# Patient Record
Sex: Female | Born: 1945
Health system: Southern US, Community
[De-identification: ages and names within clinical notes are randomized; demographics above are authoritative.]

## PROBLEM LIST (undated history)

## (undated) DIAGNOSIS — K589 Irritable bowel syndrome without diarrhea: Secondary | ICD-10-CM

## (undated) DIAGNOSIS — E785 Hyperlipidemia, unspecified: Secondary | ICD-10-CM

## (undated) DIAGNOSIS — E559 Vitamin D deficiency, unspecified: Secondary | ICD-10-CM

## (undated) DIAGNOSIS — Z8616 Personal history of COVID-19: Secondary | ICD-10-CM

## (undated) DIAGNOSIS — R197 Diarrhea, unspecified: Secondary | ICD-10-CM

## (undated) DIAGNOSIS — D509 Iron deficiency anemia, unspecified: Secondary | ICD-10-CM

## (undated) DIAGNOSIS — K5790 Diverticulosis of intestine, part unspecified, without perforation or abscess without bleeding: Secondary | ICD-10-CM

## (undated) DIAGNOSIS — K635 Polyp of colon: Secondary | ICD-10-CM

## (undated) DIAGNOSIS — Z860100 Personal history of colon polyps, unspecified: Secondary | ICD-10-CM

## (undated) DIAGNOSIS — Z5189 Encounter for other specified aftercare: Secondary | ICD-10-CM

## (undated) DIAGNOSIS — E039 Hypothyroidism, unspecified: Secondary | ICD-10-CM

## (undated) DIAGNOSIS — K219 Gastro-esophageal reflux disease without esophagitis: Secondary | ICD-10-CM

## (undated) DIAGNOSIS — I10 Essential (primary) hypertension: Secondary | ICD-10-CM

## (undated) DIAGNOSIS — N3281 Overactive bladder: Secondary | ICD-10-CM

## (undated) DIAGNOSIS — F32A Depression, unspecified: Secondary | ICD-10-CM

## (undated) DIAGNOSIS — K648 Other hemorrhoids: Secondary | ICD-10-CM

## (undated) DIAGNOSIS — Z8719 Personal history of other diseases of the digestive system: Secondary | ICD-10-CM

## (undated) DIAGNOSIS — Z85828 Personal history of other malignant neoplasm of skin: Secondary | ICD-10-CM

## (undated) DIAGNOSIS — I447 Left bundle-branch block, unspecified: Secondary | ICD-10-CM

## (undated) DIAGNOSIS — I509 Heart failure, unspecified: Secondary | ICD-10-CM

## (undated) DIAGNOSIS — H269 Unspecified cataract: Secondary | ICD-10-CM

## (undated) DIAGNOSIS — F329 Major depressive disorder, single episode, unspecified: Secondary | ICD-10-CM

## (undated) DIAGNOSIS — F419 Anxiety disorder, unspecified: Secondary | ICD-10-CM

## (undated) DIAGNOSIS — T7840XA Allergy, unspecified, initial encounter: Secondary | ICD-10-CM

## (undated) DIAGNOSIS — G250 Essential tremor: Secondary | ICD-10-CM

## (undated) DIAGNOSIS — N189 Chronic kidney disease, unspecified: Secondary | ICD-10-CM

## (undated) DIAGNOSIS — M1990 Unspecified osteoarthritis, unspecified site: Secondary | ICD-10-CM

## (undated) DIAGNOSIS — R490 Dysphonia: Secondary | ICD-10-CM

## (undated) DIAGNOSIS — N6019 Diffuse cystic mastopathy of unspecified breast: Secondary | ICD-10-CM

## (undated) DIAGNOSIS — C801 Malignant (primary) neoplasm, unspecified: Secondary | ICD-10-CM

## (undated) DIAGNOSIS — I499 Cardiac arrhythmia, unspecified: Secondary | ICD-10-CM

## (undated) HISTORY — DX: Major depressive disorder, single episode, unspecified: F32.9

## (undated) HISTORY — PX: COLONOSCOPY: SHX174

## (undated) HISTORY — PX: THROAT SURGERY: SHX803

## (undated) HISTORY — DX: Overactive bladder: N32.81

## (undated) HISTORY — DX: Malignant (primary) neoplasm, unspecified: C80.1

## (undated) HISTORY — DX: Essential (primary) hypertension: I10

## (undated) HISTORY — DX: Unspecified osteoarthritis, unspecified site: M19.90

## (undated) HISTORY — DX: Encounter for other specified aftercare: Z51.89

## (undated) HISTORY — DX: Allergy, unspecified, initial encounter: T78.40XA

## (undated) HISTORY — PX: HERNIA REPAIR: SHX51

## (undated) HISTORY — DX: Hypothyroidism, unspecified: E03.9

## (undated) HISTORY — PX: UPPER GASTROINTESTINAL ENDOSCOPY: SHX188

## (undated) HISTORY — DX: Hyperlipidemia, unspecified: E78.5

## (undated) HISTORY — DX: Personal history of COVID-19: Z86.16

## (undated) HISTORY — DX: Diffuse cystic mastopathy of unspecified breast: N60.19

## (undated) HISTORY — DX: Personal history of other malignant neoplasm of skin: Z85.828

## (undated) HISTORY — DX: Anxiety disorder, unspecified: F41.9

## (undated) HISTORY — DX: Vitamin D deficiency, unspecified: E55.9

## (undated) HISTORY — DX: Unspecified cataract: H26.9

## (undated) HISTORY — PX: NISSEN FUNDOPLICATION: SHX2091

## (undated) HISTORY — DX: Chronic kidney disease, unspecified: N18.9

## (undated) HISTORY — DX: Polyp of colon: K63.5

## (undated) HISTORY — DX: Dysphonia: R49.0

## (undated) HISTORY — DX: Irritable bowel syndrome, unspecified: K58.9

## (undated) HISTORY — DX: Heart failure, unspecified: I50.9

## (undated) HISTORY — PX: ABDOMINAL HYSTERECTOMY: SHX81

## (undated) HISTORY — DX: Iron deficiency anemia, unspecified: D50.9

## (undated) HISTORY — DX: Depression, unspecified: F32.A

## (undated) HISTORY — PX: NASAL SINUS SURGERY: SHX719

## (undated) HISTORY — DX: Essential tremor: G25.0

## (undated) HISTORY — DX: Personal history of colon polyps, unspecified: Z86.0100

## (undated) HISTORY — DX: Diarrhea, unspecified: R19.7

## (undated) HISTORY — DX: Diverticulosis of intestine, part unspecified, without perforation or abscess without bleeding: K57.90

## (undated) HISTORY — PX: THYROIDECTOMY: SHX17

## (undated) HISTORY — PX: FOOT SURGERY: SHX648

## (undated) HISTORY — DX: Personal history of colonic polyps: Z86.010

## (undated) HISTORY — PX: OTHER SURGICAL HISTORY: SHX169

## (undated) HISTORY — DX: Gastro-esophageal reflux disease without esophagitis: K21.9

## (undated) HISTORY — DX: Other hemorrhoids: K64.8

---

## 1982-04-03 HISTORY — PX: ABDOMINAL HYSTERECTOMY: SHX81

## 2001-07-31 ENCOUNTER — Encounter: Payer: Self-pay | Admitting: Internal Medicine

## 2001-08-22 ENCOUNTER — Encounter: Payer: Self-pay | Admitting: Internal Medicine

## 2001-08-23 ENCOUNTER — Encounter: Payer: Self-pay | Admitting: Internal Medicine

## 2001-09-04 ENCOUNTER — Encounter: Payer: Self-pay | Admitting: Internal Medicine

## 2001-09-08 ENCOUNTER — Encounter: Payer: Self-pay | Admitting: Internal Medicine

## 2001-09-10 ENCOUNTER — Ambulatory Visit (HOSPITAL_COMMUNITY): Admission: RE | Admit: 2001-09-10 | Discharge: 2001-09-10 | Payer: Self-pay | Admitting: Neurosurgery

## 2001-09-12 ENCOUNTER — Ambulatory Visit (HOSPITAL_COMMUNITY): Admission: RE | Admit: 2001-09-12 | Discharge: 2001-09-12 | Payer: Self-pay | Admitting: Neurosurgery

## 2001-10-09 ENCOUNTER — Encounter: Payer: Self-pay | Admitting: Internal Medicine

## 2001-11-06 ENCOUNTER — Encounter: Payer: Self-pay | Admitting: Internal Medicine

## 2001-12-03 ENCOUNTER — Encounter: Payer: Self-pay | Admitting: Internal Medicine

## 2001-12-10 ENCOUNTER — Encounter (HOSPITAL_COMMUNITY): Admission: RE | Admit: 2001-12-10 | Discharge: 2002-03-10 | Payer: Self-pay | Admitting: Endocrinology

## 2001-12-11 ENCOUNTER — Encounter: Payer: Self-pay | Admitting: Endocrinology

## 2001-12-31 ENCOUNTER — Encounter: Payer: Self-pay | Admitting: Internal Medicine

## 2002-02-26 ENCOUNTER — Encounter: Payer: Self-pay | Admitting: Internal Medicine

## 2002-04-03 HISTORY — PX: THYROIDECTOMY: SHX17

## 2002-04-07 ENCOUNTER — Encounter: Payer: Self-pay | Admitting: Surgery

## 2002-04-08 ENCOUNTER — Inpatient Hospital Stay (HOSPITAL_COMMUNITY): Admission: RE | Admit: 2002-04-08 | Discharge: 2002-04-10 | Payer: Self-pay | Admitting: Surgery

## 2002-04-08 ENCOUNTER — Encounter (INDEPENDENT_AMBULATORY_CARE_PROVIDER_SITE_OTHER): Payer: Self-pay | Admitting: *Deleted

## 2003-08-10 ENCOUNTER — Encounter: Payer: Self-pay | Admitting: Internal Medicine

## 2003-08-27 ENCOUNTER — Encounter: Payer: Self-pay | Admitting: Internal Medicine

## 2004-04-03 DIAGNOSIS — K259 Gastric ulcer, unspecified as acute or chronic, without hemorrhage or perforation: Secondary | ICD-10-CM

## 2004-04-03 HISTORY — DX: Gastric ulcer, unspecified as acute or chronic, without hemorrhage or perforation: K25.9

## 2004-04-06 ENCOUNTER — Ambulatory Visit: Payer: Self-pay

## 2004-10-28 ENCOUNTER — Inpatient Hospital Stay (HOSPITAL_COMMUNITY): Admission: AD | Admit: 2004-10-28 | Discharge: 2004-10-31 | Payer: Self-pay | Admitting: Internal Medicine

## 2004-10-29 ENCOUNTER — Encounter (INDEPENDENT_AMBULATORY_CARE_PROVIDER_SITE_OTHER): Payer: Self-pay | Admitting: *Deleted

## 2004-10-31 ENCOUNTER — Encounter (INDEPENDENT_AMBULATORY_CARE_PROVIDER_SITE_OTHER): Payer: Self-pay | Admitting: *Deleted

## 2004-11-02 ENCOUNTER — Ambulatory Visit: Payer: Self-pay | Admitting: Internal Medicine

## 2004-11-29 ENCOUNTER — Ambulatory Visit: Payer: Self-pay | Admitting: Internal Medicine

## 2004-11-29 ENCOUNTER — Ambulatory Visit (HOSPITAL_COMMUNITY): Admission: RE | Admit: 2004-11-29 | Discharge: 2004-11-29 | Payer: Self-pay | Admitting: Internal Medicine

## 2004-12-13 ENCOUNTER — Ambulatory Visit: Payer: Self-pay | Admitting: Internal Medicine

## 2004-12-15 ENCOUNTER — Encounter: Payer: Self-pay | Admitting: Internal Medicine

## 2005-04-02 ENCOUNTER — Encounter (INDEPENDENT_AMBULATORY_CARE_PROVIDER_SITE_OTHER): Payer: Self-pay | Admitting: *Deleted

## 2005-07-03 ENCOUNTER — Ambulatory Visit: Payer: Self-pay

## 2006-07-10 ENCOUNTER — Ambulatory Visit: Payer: Self-pay | Admitting: Internal Medicine

## 2007-08-20 ENCOUNTER — Ambulatory Visit: Payer: Self-pay | Admitting: Internal Medicine

## 2008-08-24 ENCOUNTER — Ambulatory Visit: Payer: Self-pay | Admitting: Internal Medicine

## 2008-11-12 ENCOUNTER — Telehealth: Payer: Self-pay | Admitting: Internal Medicine

## 2008-11-19 DIAGNOSIS — K648 Other hemorrhoids: Secondary | ICD-10-CM | POA: Insufficient documentation

## 2008-11-19 DIAGNOSIS — K589 Irritable bowel syndrome without diarrhea: Secondary | ICD-10-CM | POA: Insufficient documentation

## 2008-11-19 DIAGNOSIS — K5730 Diverticulosis of large intestine without perforation or abscess without bleeding: Secondary | ICD-10-CM | POA: Insufficient documentation

## 2008-11-19 DIAGNOSIS — C4490 Unspecified malignant neoplasm of skin, unspecified: Secondary | ICD-10-CM | POA: Insufficient documentation

## 2008-11-19 DIAGNOSIS — K219 Gastro-esophageal reflux disease without esophagitis: Secondary | ICD-10-CM | POA: Insufficient documentation

## 2008-11-19 DIAGNOSIS — Z8719 Personal history of other diseases of the digestive system: Secondary | ICD-10-CM | POA: Insufficient documentation

## 2008-11-19 DIAGNOSIS — N6019 Diffuse cystic mastopathy of unspecified breast: Secondary | ICD-10-CM | POA: Insufficient documentation

## 2008-11-19 DIAGNOSIS — R197 Diarrhea, unspecified: Secondary | ICD-10-CM | POA: Insufficient documentation

## 2008-11-19 DIAGNOSIS — I10 Essential (primary) hypertension: Secondary | ICD-10-CM | POA: Insufficient documentation

## 2008-11-19 DIAGNOSIS — F411 Generalized anxiety disorder: Secondary | ICD-10-CM | POA: Insufficient documentation

## 2008-11-19 DIAGNOSIS — D509 Iron deficiency anemia, unspecified: Secondary | ICD-10-CM | POA: Insufficient documentation

## 2008-11-19 DIAGNOSIS — E039 Hypothyroidism, unspecified: Secondary | ICD-10-CM | POA: Insufficient documentation

## 2008-11-26 ENCOUNTER — Ambulatory Visit: Payer: Self-pay | Admitting: Internal Medicine

## 2008-11-26 DIAGNOSIS — D126 Benign neoplasm of colon, unspecified: Secondary | ICD-10-CM | POA: Insufficient documentation

## 2008-12-02 ENCOUNTER — Telehealth: Payer: Self-pay | Admitting: Internal Medicine

## 2008-12-03 ENCOUNTER — Encounter: Payer: Self-pay | Admitting: Internal Medicine

## 2008-12-03 ENCOUNTER — Ambulatory Visit: Payer: Self-pay | Admitting: Internal Medicine

## 2008-12-08 ENCOUNTER — Encounter: Payer: Self-pay | Admitting: Internal Medicine

## 2009-04-03 HISTORY — PX: HERNIA REPAIR: SHX51

## 2009-06-22 ENCOUNTER — Encounter: Payer: Self-pay | Admitting: Internal Medicine

## 2009-06-23 ENCOUNTER — Encounter: Payer: Self-pay | Admitting: Internal Medicine

## 2009-06-29 ENCOUNTER — Encounter: Payer: Self-pay | Admitting: Internal Medicine

## 2009-06-30 ENCOUNTER — Encounter: Payer: Self-pay | Admitting: Internal Medicine

## 2009-07-01 ENCOUNTER — Ambulatory Visit: Payer: Self-pay | Admitting: Internal Medicine

## 2009-07-05 ENCOUNTER — Telehealth: Payer: Self-pay | Admitting: Internal Medicine

## 2009-07-06 ENCOUNTER — Encounter (INDEPENDENT_AMBULATORY_CARE_PROVIDER_SITE_OTHER): Payer: Self-pay | Admitting: *Deleted

## 2009-07-07 ENCOUNTER — Ambulatory Visit: Payer: Self-pay | Admitting: Internal Medicine

## 2009-07-08 ENCOUNTER — Ambulatory Visit: Payer: Self-pay | Admitting: Internal Medicine

## 2009-07-08 ENCOUNTER — Telehealth: Payer: Self-pay | Admitting: Internal Medicine

## 2009-07-08 DIAGNOSIS — K449 Diaphragmatic hernia without obstruction or gangrene: Secondary | ICD-10-CM | POA: Insufficient documentation

## 2009-07-12 ENCOUNTER — Encounter: Payer: Self-pay | Admitting: Internal Medicine

## 2009-07-13 ENCOUNTER — Ambulatory Visit (HOSPITAL_COMMUNITY)
Admission: RE | Admit: 2009-07-13 | Discharge: 2009-07-13 | Payer: Self-pay | Source: Home / Self Care | Admitting: Internal Medicine

## 2009-08-03 ENCOUNTER — Ambulatory Visit: Payer: Self-pay | Admitting: Internal Medicine

## 2009-08-04 ENCOUNTER — Telehealth: Payer: Self-pay | Admitting: Internal Medicine

## 2009-08-04 LAB — CONVERTED CEMR LAB
Basophils Absolute: 0 10*3/uL (ref 0.0–0.1)
Eosinophils Absolute: 0.1 10*3/uL (ref 0.0–0.7)
Eosinophils Relative: 2 % (ref 0.0–5.0)
HCT: 29.7 % — ABNORMAL LOW (ref 36.0–46.0)
Lymphocytes Relative: 24.6 % (ref 12.0–46.0)
Lymphs Abs: 1.6 10*3/uL (ref 0.7–4.0)
MCHC: 32.6 g/dL (ref 30.0–36.0)
MCV: 71.3 fL — ABNORMAL LOW (ref 78.0–100.0)
Monocytes Relative: 6.7 % (ref 3.0–12.0)
Neutro Abs: 4.4 10*3/uL (ref 1.4–7.7)
Neutrophils Relative %: 66.4 % (ref 43.0–77.0)
Platelets: 274 10*3/uL (ref 150.0–400.0)

## 2009-08-06 ENCOUNTER — Ambulatory Visit (HOSPITAL_COMMUNITY)
Admission: RE | Admit: 2009-08-06 | Discharge: 2009-08-06 | Payer: Self-pay | Source: Home / Self Care | Admitting: Internal Medicine

## 2009-08-20 ENCOUNTER — Encounter: Payer: Self-pay | Admitting: Internal Medicine

## 2009-08-23 ENCOUNTER — Encounter: Payer: Self-pay | Admitting: Internal Medicine

## 2009-09-16 ENCOUNTER — Ambulatory Visit: Payer: Self-pay | Admitting: Internal Medicine

## 2009-10-01 HISTORY — PX: OTHER SURGICAL HISTORY: SHX169

## 2009-10-13 ENCOUNTER — Inpatient Hospital Stay (HOSPITAL_COMMUNITY): Admission: RE | Admit: 2009-10-13 | Discharge: 2009-10-18 | Payer: Self-pay | Admitting: Surgery

## 2009-10-14 ENCOUNTER — Encounter (INDEPENDENT_AMBULATORY_CARE_PROVIDER_SITE_OTHER): Payer: Self-pay | Admitting: *Deleted

## 2009-10-19 ENCOUNTER — Encounter (INDEPENDENT_AMBULATORY_CARE_PROVIDER_SITE_OTHER): Payer: Self-pay | Admitting: *Deleted

## 2009-11-04 ENCOUNTER — Encounter: Payer: Self-pay | Admitting: Internal Medicine

## 2009-11-29 ENCOUNTER — Telehealth: Payer: Self-pay | Admitting: Internal Medicine

## 2009-12-01 ENCOUNTER — Encounter: Admission: RE | Admit: 2009-12-01 | Discharge: 2009-12-01 | Payer: Self-pay | Admitting: Internal Medicine

## 2009-12-02 ENCOUNTER — Ambulatory Visit: Payer: Self-pay | Admitting: Gastroenterology

## 2009-12-02 ENCOUNTER — Ambulatory Visit: Payer: Self-pay | Admitting: Internal Medicine

## 2009-12-02 ENCOUNTER — Telehealth: Payer: Self-pay | Admitting: Internal Medicine

## 2009-12-02 DIAGNOSIS — R143 Flatulence: Secondary | ICD-10-CM

## 2009-12-02 DIAGNOSIS — Z860100 Personal history of colon polyps, unspecified: Secondary | ICD-10-CM | POA: Insufficient documentation

## 2009-12-02 DIAGNOSIS — R142 Eructation: Secondary | ICD-10-CM

## 2009-12-02 DIAGNOSIS — R141 Gas pain: Secondary | ICD-10-CM | POA: Insufficient documentation

## 2009-12-02 DIAGNOSIS — R1013 Epigastric pain: Secondary | ICD-10-CM

## 2009-12-02 DIAGNOSIS — K625 Hemorrhage of anus and rectum: Secondary | ICD-10-CM | POA: Insufficient documentation

## 2009-12-02 DIAGNOSIS — R599 Enlarged lymph nodes, unspecified: Secondary | ICD-10-CM | POA: Insufficient documentation

## 2009-12-02 DIAGNOSIS — K3189 Other diseases of stomach and duodenum: Secondary | ICD-10-CM | POA: Insufficient documentation

## 2009-12-02 LAB — CONVERTED CEMR LAB
BUN: 28 mg/dL — ABNORMAL HIGH (ref 6–23)
CO2: 27 meq/L (ref 19–32)
Creatinine, Ser: 1.1 mg/dL (ref 0.4–1.2)
Potassium: 3.8 meq/L (ref 3.5–5.1)
Sodium: 140 meq/L (ref 135–145)

## 2009-12-17 ENCOUNTER — Encounter: Payer: Self-pay | Admitting: Internal Medicine

## 2010-01-20 ENCOUNTER — Encounter: Payer: Self-pay | Admitting: Internal Medicine

## 2010-03-18 ENCOUNTER — Encounter: Payer: Self-pay | Admitting: Internal Medicine

## 2010-05-03 NOTE — Assessment & Plan Note (Signed)
Summary: hgb 8.6 ? crohns, diarrhea//dn--see note in np file   History of Present Illness Visit Type: Follow-up Consult Primary GI MD: Lina Sar MD Primary Provider: Creola Corn, MD Requesting Provider: n/a Chief Complaint: crohn's low hemoglobin and c/o diarrhea History of Present Illness:   This is a 65 year old, white female with chronic intermittent GI blood loss which was evaluated in the past on several occasions. She was found to have a hemoglobin of 8.6 and a 4.2% iron saturation last week. In 2003 she was evaluated by Dr.Birdram with an upper and lower endoscopy which showed a small hiatal hernia but normal duodenal biopsies. There was no evidence of polyps or colitis. She presented with a hemoglobin of 6 in 2005 and again had a colonoscopy and endoscopy as well as an upper GI which showed only erosive gastritis. A small bowel capsule endoscopy done in August 2006 showed multiple small bowel ulcerations. Her IBD markers have been negative. She is a patient with diarrhea. In September 2010, the patient underwent a colonoscopy which showed mild diverticulosis of the left colon. There was no evidence of colitis. She currently denies an abdominal pain, nausea,vomiting or any weight changes. She takes aspirin 81 mg daily. In the past, iron supplements were effective in correcting her iron deficiency anemia, then she stopped taking her iron.   GI Review of Systems    Reports acid reflux and  heartburn.      Denies abdominal pain, belching, bloating, chest pain, dysphagia with liquids, dysphagia with solids, loss of appetite, nausea, vomiting, vomiting blood, weight loss, and  weight gain.      Reports change in bowel habits, diarrhea, and  fecal incontinence.     Denies anal fissure, black tarry stools, constipation, diverticulosis, heme positive stool, hemorrhoids, irritable bowel syndrome, jaundice, light color stool, liver problems, rectal bleeding, and  rectal pain.    Current  Medications (verified): 1)  Lisinopril-Hydrochlorothiazide 20-12.5 Mg Tabs (Lisinopril-Hydrochlorothiazide) .... One Tablet By Mouth Once Daily 2)  Alprazolam 0.5 Mg Tabs (Alprazolam) .... Take 1 Tablet By Mouth Two Times A Day As Needed 3)  Fexofenadine Hcl 180 Mg Tabs (Fexofenadine Hcl) .... One Daily 4)  Sertraline Hcl 100 Mg Tabs (Sertraline Hcl) .... One Tablet By Mouth Daily 5)  Synthroid 125 Mcg Tabs (Levothyroxine Sodium) .... One Tablet By Mouth Monday, Tuesday, Friday, Saturday.  On Wednesday Take 1/2 Tablet. Take Nothing On Sunday 6)  Aspir-Low 81 Mg Tbec (Aspirin) .... One Tablet By Mouth Once Daily 7)  Calcium-Vitamin D 600-125 Mg-Unit Tabs (Calcium-Vitamin D) .... One Tablet Two Times A Day 8)  Senior Multivitamin Plus  Tabs (Multiple Vitamins-Minerals) .... One Tablet By Mouth Once Daily 9)  Pantoprazole Sodium 40 Mg Tbec (Pantoprazole Sodium) .... One Tablet By Mouth Two Times A Day 10)  Flaxseed Oil 1000 Mg Caps (Flaxseed (Linseed)) .... One Tablet By Mouth Once Daily 11)  Singulair 10 Mg Tabs (Montelukast Sodium) .... One Tablet By Mouth Once Daily 12)  Flonase 50 Mcg/act Susp (Fluticasone Propionate) .... Two Sprays in Each Nostril At Bedtime 13)  Amitriptyline Hcl 50 Mg Tabs (Amitriptyline Hcl) .Marland Kitchen.. 1 By Mouth Once Daily 14)  Nu-Iron 150 Mg Caps (Polysaccharide Iron Complex) .Marland Kitchen.. 1 By Mouth Once Daily  Allergies (verified): No Known Drug Allergies  Past History:  Past Medical History: Reviewed history from 11/26/2008 and no changes required. Current Problems:  COLONIC POLYPS (ICD-211.3) FIBROCYSTIC BREAST DISEASE (ICD-610.1) HYPERTENSION (ICD-401.9) ANXIETY (ICD-300.00) Hx of CARCINOMA, BASAL CELL (ICD-173.9) UNSPECIFIED HYPOTHYROIDISM (ICD-244.9) IRRITABLE  BOWEL SYNDROME (ICD-564.1) FIBROMYALGIA (ICD-729.1) DIVERTICULOSIS, COLON (ICD-562.10) INTERNAL HEMORRHOIDS (ICD-455.0) GASTROINTESTINAL HEMORRHAGE, HX OF (ICD-V12.79) GERD (ICD-530.81) DIARRHEA  (ICD-787.91) ANEMIA, IRON DEFICIENCY (ICD-280.9)      Past Surgical History: Hysterectomy Foot Surgery Submucosal Resection thyroidectomy  Family History: Reviewed history from 11/26/2008 and no changes required. Family History of Breast Cancer: Sister No FH of Colon Cancer: Family History of Diabetes: Father, 2 Aunts, Cousin Family History of Colitis:First Cousin  Family History of Heart Disease: Mother-MI @ 74 CVA-Father   Social History: Reviewed history from 11/26/2008 and no changes required. Occupation: Retired Runner, broadcasting/film/video Married  2 boys  Alcohol Use - yes-occasional wine Illicit Drug Use - no Patient has never smoked.   Review of Systems       The patient complains of fatigue and shortness of breath.  The patient denies allergy/sinus, anemia, anxiety-new, arthritis/joint pain, back pain, blood in urine, breast changes/lumps, change in vision, confusion, cough, coughing up blood, depression-new, fainting, fever, headaches-new, hearing problems, heart murmur, heart rhythm changes, itching, menstrual pain, muscle pains/cramps, night sweats, nosebleeds, pregnancy symptoms, skin rash, sleeping problems, sore throat, swelling of feet/legs, swollen lymph glands, thirst - excessive , urination - excessive , urination changes/pain, urine leakage, vision changes, and voice change.         Pertinent positive and negative review of systems were noted in the above HPI. All other ROS was otherwise negative.   Vital Signs:  Patient profile:   65 year old female Height:      70 inches Weight:      208 pounds BMI:     29.95 Pulse rate:   92 / minute Pulse rhythm:   regular BP sitting:   118 / 70  (left arm)  Vitals Entered By: Milford Cage NCMA (July 01, 2009 10:05 AM)  Physical Exam  General:  Well developed, well nourished, no acute distress. Eyes:  PERRLA, no icterus. Mouth:  No deformity or lesions, dentition normal. Neck:  Supple; no masses or thyromegaly. Lungs:   Clear throughout to auscultation. Heart:  Regular rate and rhythm; no murmurs, rubs,  or bruits. Abdomen:  soft nontender abdomen with normoactive bowel sounds. No distention. Liver edge at costal margin.  Rectal:  normal rectal tone. Stool is soft and Hemoccult-negative but dark in color due to iron supplements. Extremities:  No clubbing, cyanosis, edema or deformities noted. Skin:  Intact without significant lesions or rashes. Psych:  Alert and cooperative. Normal mood and affect.   Impression & Recommendations:  Problem # 1:  GASTROINTESTINAL HEMORRHAGE, HX OF (ICD-V12.79) Patient has a recurrent low-grade GI bleed previously evaluated in 2003 as well as in 2006. She has recurrent iron deficiency anemia but is Hemoccult-negative today on my exam. Her small bowel capsule endoscopy in 2006  confirmed the presence of small intestine ulcerations of unknown etiology. Her IBD markers are negative. I would like to repeat the small bowel capsule endoscopy to look for specific signs of Crohn's disease and to see if we can reproduce the same findings. Regardless of the etiology of the small bowel ulcerations, she will have to stay on iron indefinitely and may even need iron infusions at times. If  Crohn's disease of the small intestine if proven, may require low-dose steroids or immunomodulators.  Problem # 2:  COLONIC POLYPS (ICD-211.3) Patient is due for a recall colonoscopy in September 2015.  Other Orders: Capsule Endoscopy (Capsule Endoscopy)  Patient Instructions: 1)  continue iron supplements 2)  Small bowel capsule endoscopy. Discontinue iron 3 days  prior to the exam 3)  Follow  CBC on a monthly basis, 4)  for recall colonoscopy home September 2015 5)  Copy sent to : Dr Jessee Avers

## 2010-05-03 NOTE — Procedures (Signed)
Summary: Upper Endoscopy  Patient: Brandy Golden Note: All result statuses are Final unless otherwise noted.  Tests: (1) Upper Endoscopy (EGD)   EGD Upper Endoscopy       DONE     [facility]     [address]     [place], [place]  [address]           ENDOSCOPY PROCEDURE REPORT           PATIENT:  Brandy Golden, Brandy Golden  MR#:  [id number]     BIRTHDATE:  19-Jul-1945, 64 yrs. old  GENDER:  female           ENDOSCOPIST:  Hedwig Morton. Juanda Chance, MD     Referred by:  Creola Corn, M.D.           PROCEDURE DATE:  07/08/2009     PROCEDURE:  EGD with enteroscopy     ASA CLASS:  Class I     INDICATIONS:  iron deficiency anemia hx of chronic low grade GI     blood loss evaluated in 2003 and 2005, SBCE in 2006 shows      ulcerations, IBD markers are negative           MEDICATIONS:   Versed 7 mg, Fentanyl 75 mcg, Benadryl 25 mg     TOPICAL ANESTHETIC:  Exactacain Spray           DESCRIPTION OF PROCEDURE:   After the risks benefits and     alternatives of the procedure were thoroughly explained, informed     consent was obtained.  The [facility] [id number] endoscope was     introduced through the mouth and advanced to the proximal jejunum,     without limitations.  The instrument was slowly withdrawn as the     mucosa was fully examined.     <<PROCEDUREIMAGES>>           A hiatal hernia was found. large nonreducible hiatal hernia 31- 39     cm, ( 8 cm) with Sheria Lang erosions ( linear erosions on top of     folds at the hernia rim, coffee ground coating the lesions)     Multiple biopsies were obtained and sent to pathology (see image2,     image3, image4, image5, and image10).  Otherwise the examination     was normal in the proximal jejunum. normal appearing duodenum and     jejunum, no evidence of ulcerations Multiple biopsies were     obtained and sent to pathology (see image10, image9, image8,     image7, and image6).    Retroflexed views revealed no     abnormalities.    The scope was then  withdrawn from the patient     and the procedure completed.           COMPLICATIONS:  None           ENDOSCOPIC IMPRESSION:     1) Hiatal hernia     2) Otherwise normal examination in the proximal jejunum     8 cm nonreducible HH with Cameron erosions is a possible site of     GIB     RECOMMENDATIONS:     continue Pantoprazole 40 mg bid     UGI series ans Barium esophagram " hiatal herni" and SBFT, to     r/o Crohn's     continue Iron supplements           REPEAT EXAM:  In 0 year(s) for.  ______________________________     Hedwig Morton. Juanda Chance, MD           CC:           n.     eSIGNED:   Hedwig Morton. Nikole Swartzentruber at 07/08/2009 03:58 PM           Page 2 of 3   [name], [name], [id number]  Note: An exclamation mark (!) indicates a result that was not dispersed into the flowsheet. Document Creation Date: 07/08/2009 3:59 PM _______________________________________________________________________  (1) Order result status: Final Collection or observation date-time: 07/08/2009 15:42 Requested date-time:  Receipt date-time:  Reported date-time:  Referring Physician:   Ordering Physician: Lina Sar [phone]) Specimen Source:  Source: Launa Grill Order Number: [id number] Lab site:

## 2010-05-03 NOTE — Assessment & Plan Note (Signed)
Summary: POST ENDO/UGI/SBFT              Brandy Golden   History of Present Illness Visit Type: Follow-up Visit Primary GI MD: Lina Sar MD Primary Provider: Creola Corn, MD Requesting Provider: n/a Chief Complaint: dysphagia of solids, severe reflux nightly History of Present Illness:   This is a 65 year old white female with chronic GI blood loss due to Brandy Golden Ps erosions documented on several prior upper endoscopies. The last upper endoscopy on July 09, 2009 showed gastritis and a 6-7 cm large hiatal hernia extending from 31-39 cm from the incisors. An upper GI with small bowel follow-through confirmed the large hiatal hernia with at least two thirds of the stomach above the diaphragm. A small bowel follow-through was normal. She has had several prior GI evaluations for chronic intermittent GI blood loss starting in 2003 and subsequently in 2006. She had a normal colonoscopy in September 2010. Her hemoglobin dropped to 8.6 recently with and iron saturation of 4.2 %  and she has been on iron supplements. Patient has chronic intermittent diarrhea. Her small bowel follow-through showed rapid transit. Her small bowel capsule endoscopy in 2006 showed small bowel ulcerations.   GI Review of Systems    Reports acid reflux, belching, bloating, dysphagia with solids, and  vomiting.      Denies abdominal pain, chest pain, dysphagia with liquids, heartburn, loss of appetite, nausea, vomiting blood, weight loss, and  weight gain.      Reports diarrhea.     Denies anal fissure, black tarry stools, change in bowel habit, constipation, diverticulosis, fecal incontinence, heme positive stool, hemorrhoids, irritable bowel syndrome, jaundice, light color stool, liver problems, rectal bleeding, and  rectal pain.    Current Medications (verified): 1)  Lisinopril-Hydrochlorothiazide 20-12.5 Mg Tabs (Lisinopril-Hydrochlorothiazide) .... One Tablet By Mouth Once Daily 2)  Alprazolam 0.5 Mg Tabs (Alprazolam) .... Take 1  Tablet By Mouth Two Times A Day As Needed 3)  Fexofenadine Hcl 180 Mg Tabs (Fexofenadine Hcl) .... One Daily As Needed 4)  Sertraline Hcl 100 Mg Tabs (Sertraline Hcl) .... One Tablet By Mouth Daily 5)  Synthroid 125 Mcg Tabs (Levothyroxine Sodium) .... One Tablet By Mouth Monday, Tuesday, Friday, Saturday.  On Wednesday Take 1/2 Tablet. Take Nothing On Sunday 6)  Aspir-Low 81 Mg Tbec (Aspirin) .... One Tablet By Mouth Once Daily 7)  Calcium-Vitamin D 600-125 Mg-Unit Tabs (Calcium-Vitamin D) .... One Tablet Two Times A Day 8)  Senior Multivitamin Plus  Tabs (Multiple Vitamins-Minerals) .... One Tablet By Mouth Once Daily 9)  Pantoprazole Sodium 40 Mg Tbec (Pantoprazole Sodium) .... One Tablet By Mouth Two Times A Day 10)  Flaxseed Oil 1000 Mg Caps (Flaxseed (Linseed)) .... One Tablet By Mouth Once Daily 11)  Flonase 50 Mcg/act Susp (Fluticasone Propionate) .... Two Sprays in Each Nostril At Bedtime 12)  Amitriptyline Hcl 50 Mg Tabs (Amitriptyline Hcl) .Marland Kitchen.. 1 By Mouth Once Daily 13)  Nu-Iron 150 Mg Caps (Polysaccharide Iron Complex) .Marland Kitchen.. 1 By Mouth Once Daily  Allergies (verified): No Known Drug Allergies  Past History:  Past Medical History: Reviewed history from 11/26/2008 and no changes required. Current Problems:  COLONIC POLYPS (ICD-211.3) FIBROCYSTIC BREAST DISEASE (ICD-610.1) HYPERTENSION (ICD-401.9) ANXIETY (ICD-300.00) Hx of CARCINOMA, BASAL CELL (ICD-173.9) UNSPECIFIED HYPOTHYROIDISM (ICD-244.9) IRRITABLE BOWEL SYNDROME (ICD-564.1) FIBROMYALGIA (ICD-729.1) DIVERTICULOSIS, COLON (ICD-562.10) INTERNAL HEMORRHOIDS (ICD-455.0) GASTROINTESTINAL HEMORRHAGE, HX OF (ICD-V12.79) GERD (ICD-530.81) DIARRHEA (ICD-787.91) ANEMIA, IRON DEFICIENCY (ICD-280.9)      Past Surgical History: Reviewed history from 07/01/2009 and no changes required. Hysterectomy  Foot Surgery Submucosal Resection thyroidectomy  Family History: Reviewed history from 11/26/2008 and no changes  required. Family History of Breast Cancer: Sister No FH of Colon Cancer: Family History of Diabetes: Father, 2 Aunts, Cousin Family History of Colitis:First Cousin  Family History of Heart Disease: Mother-MI @ 36 CVA-Father   Social History: Reviewed history from 11/26/2008 and no changes required. Occupation: Retired Runner, broadcasting/film/video Married  2 boys  Alcohol Use - yes-occasional wine Illicit Drug Use - no Patient has never smoked.   Review of Systems       The patient complains of allergy/sinus, anemia, anxiety-new, cough, fatigue, shortness of breath, sleeping problems, swelling of feet/legs, thirst - excessive, and voice change.  The patient denies arthritis/joint pain, back pain, blood in urine, breast changes/lumps, change in vision, confusion, coughing up blood, depression-new, fainting, fever, headaches-new, hearing problems, heart murmur, heart rhythm changes, itching, menstrual pain, muscle pains/cramps, night sweats, nosebleeds, pregnancy symptoms, skin rash, sore throat, swollen lymph glands, thirst - excessive , urination - excessive , urination changes/pain, urine leakage, and vision changes.         Pertinent positive and negative review of systems were noted in the above HPI. All other ROS was otherwise negative.   Vital Signs:  Patient profile:   65 year old female Height:      70 inches Weight:      210.25 pounds BMI:     30.28 Pulse rate:   64 / minute Pulse rhythm:   regular BP sitting:   120 / 70  (left arm) Cuff size:   regular  Vitals Entered By: June McMurray CMA Duncan Dull) (Aug 03, 2009 1:41 PM)   Impression & Recommendations:  Problem # 1:  HIATAL HERNIA (ICD-553.3) Patient has a large nonreducible hiatal hernia causing chronic GI blood loss due to mechanical movement of the stomach through the diaphragm. She has constant chest discomfort and dysphagia with questionable LPR causing a chronic cough. I suggested she have a surgical reduction of the hernia. I have  discussed this extensively with the patient and her husband who understands the problem. She needs to continue on her PPI twice a day and we will recheck her blood count today and forward the information to Dr. Daphine Deutscher.  Problem # 2:  COLONIC POLYPS (ICD-211.3) Patient's last colonoscopy was in September 2010.  Problem # 3:  IRRITABLE BOWEL SYNDROME (ICD-564.1) Patient has predominant diarrhea. Rapid transit time showed on a small bowel x-ray. There was no evidence for Crohn's disease.  Other Orders: [facility] ([facility]) TLB-CBC Platelet - w/Differential ([id number]-[id number])  Patient Instructions: 1)  You have been scheduled to see Dr Luretha Murphy for consult regarding possible Nissen Fundoplication on 08/20/09 @ 2:40 pm. You should arrive no later than 2:20 pm for registration. 2)  Please go to the basement for your labwork (CBC). 3)  Continue pantoprazole 40 mg p.o. b.i.d. and antireflux measures. 4)  Continue iron supplements. 5)  Copy sent to : Dr Creola Corn 6)  The medication list was reviewed and reconciled.  All changed / newly prescribed medications were explained.  A complete medication list was provided to the patient / caregiver.

## 2010-05-03 NOTE — Assessment & Plan Note (Signed)
Summary: reflux, rectal bleeding/sheri   History of Present Illness Visit Type: Follow-up Visit Primary GI MD: Lina Sar MD Primary Provider: Creola Corn, MD Requesting Provider: n/a Chief Complaint: Patient c/o occasional LLQ abdominal discomfort as well as diarrhea and large amounts of brb. There has been no fever. Patient also c/o nausea as well as a feeling of food "coming back up". History of Present Illness:   PLEASANT 64 YO FEMALE KNOWN TO DR. Juanda Chance. SHE HAS HX OF  A LARGE MIXED HIATAL HERNIA, AND CHRONIC ANEMIA DUE TO CAMERON EROSIONS. SHE UNDERWENT REPAIR OF THE HERNIA  LAPAROSCOPICALLY WITH DR. MARTIN 10/13/09. SINCE SURGERY SHE HAS HAD ONGOING DIFFICULTY WITH REFLUX SXS EVEN ON THICK LIQUID DIET. SHE HAS HAD TO CONTINUE TO EAT VERY SMALL AMTS AT A TIME. HAS NOTED INCREASED BURPING,SOME QUEASINESS,DYSPEPSIA. SHE HAD GONE OFF HER PROTONIX AS DR. MARTIN TOLD HER SHE SHOULD NOT NEED IT. SHE HAS LOST 15 POUNDS SINCE SURGERY. SHE ALSO C/O RECTAL BLEEDING,OCCASIONAL INCONTINENCE EPISODES ,HX IBS. SHE GETS BOUTS OF URGENCY,AND DIARRHEA. THIS PAST WEEKEND SHE HAD MUCOUSY RED RECTAL BLEEDING,MORE THAN SHE HAS SEEN IN THE PAST AND IS WORRIED IT IS SOMEHOW RELATED TO HER SURGERY. NO RECTAL PAIN,HAS KNOWN DECREASED SPHINCTER TONE. NO BLEEDING  PAST 2 DAYS. SHE WENT BACK ON PPI  OVEER THE PAST WEEK -NO IMPROVEMENT SO FAR. SHE HAS ALSO HAD A BARIUM SWALLOW /UGI ON 12/01/09 WHICH SHOWS  A SMALL HIATAL HERNIA PRESENT, NO REFLUX DEMONSTRATED,MODERATE TERTIARY CONTRACTIONS. LAST COLON 9/10-MILD DIVERTICULOSIS,RANDOM BX'S NEGATIVE.   GI Review of Systems    Reports abdominal pain, acid reflux, belching, chest pain, heartburn, loss of appetite, nausea, and  weight loss.     Location of  Abdominal pain: LLQ. Weight loss of 15 pounds   Denies bloating, dysphagia with liquids, dysphagia with solids, vomiting, vomiting blood, and  weight gain.      Reports change in bowel habits, diarrhea, hemorrhoids,  irritable bowel syndrome, rectal bleeding, and  rectal pain.     Denies anal fissure, black tarry stools, constipation, diverticulosis, fecal incontinence, heme positive stool, jaundice, light color stool, and  liver problems.    Current Medications (verified): 1)  Alprazolam 0.5 Mg Tabs (Alprazolam) .... Take 1 Tablet By Mouth Two Times A Day As Needed 2)  Fexofenadine Hcl 180 Mg Tabs (Fexofenadine Hcl) .... One Daily As Needed 3)  Sertraline Hcl 100 Mg Tabs (Sertraline Hcl) .... One Tablet By Mouth Daily 4)  Synthroid 125 Mcg Tabs (Levothyroxine Sodium) .... One Tablet By Mouth Monday, Tuesday, Friday, Saturday.  On Wednesday Take 1/2 Tablet. Take Nothing On Sunday 5)  Calcium-Vitamin D 600-125 Mg-Unit Tabs (Calcium-Vitamin D) .... One Tablet Two Times A Day 6)  Senior Multivitamin Plus  Tabs (Multiple Vitamins-Minerals) .... One Tablet By Mouth Once Daily 7)  Pantoprazole Sodium 40 Mg Tbec (Pantoprazole Sodium) .... One Tablet By Mouth Two Times A Day 8)  Flaxseed Oil 1000 Mg Caps (Flaxseed (Linseed)) .... One Tablet By Mouth Once Daily 9)  Flonase 50 Mcg/act Susp (Fluticasone Propionate) .... Two Sprays in Each Nostril At Bedtime As Needed 10)  Amitriptyline Hcl 50 Mg Tabs (Amitriptyline Hcl) .Marland Kitchen.. 1 By Mouth Once Daily  Allergies (verified): No Known Drug Allergies  Past History:  Past Medical History: Current Problems:  COLONIC POLYPS (ICD-211.3) FIBROCYSTIC BREAST DISEASE (ICD-610.1) HYPERTENSION (ICD-401.9) ANXIETY (ICD-300.00) Hx of CARCINOMA, BASAL CELL (ICD-173.9) UNSPECIFIED HYPOTHYROIDISM (ICD-244.9) IRRITABLE BOWEL SYNDROME (ICD-564.1) FIBROMYALGIA (ICD-729.1) DIVERTICULOSIS, COLON (ICD-562.10) INTERNAL HEMORRHOIDS (ICD-455.0) GASTROINTESTINAL HEMORRHAGE, HX OF (ICD-V12.79) GERD (  ICD-530.81) DIARRHEA (ICD-787.91) ANEMIA, IRON DEFICIENCY (ICD-280.9)-CAMERON EROSIONS,LARGE HIATAL HERNIA      Past Surgical History: Hysterectomy Left Foot Surgery Submucosal  Resection thyroidectomy LAPAROSCOPIC REPAIR OF LARGE HIATAL HERNIA/NISSEN 7/11-DR. MARTIN  COLONOSCOPY 9/10-LEFT COLON DIVERTICULOSIS  Family History: Reviewed history from 11/26/2008 and no changes required. Family History of Breast Cancer: Sister  No FH of Colon Cancer: Family History of Diabetes: Father, 2 Aunts, Cousin Family History of Colitis:First Cousin  Family History of Heart Disease: Mother-MI @ 76 CVA-Father   Social History: Reviewed history from 11/26/2008 and no changes required. Occupation: Retired Runner, broadcasting/film/video Married  2 boys  Alcohol Use - yes-occasional wine Illicit Drug Use - no Patient has never smoked.   Review of Systems       The patient complains of allergy/sinus, anemia, fatigue, shortness of breath, and voice change.  The patient denies anxiety-new, arthritis/joint pain, back pain, blood in urine, breast changes/lumps, change in vision, confusion, cough, coughing up blood, depression-new, fainting, fever, headaches-new, hearing problems, heart murmur, heart rhythm changes, itching, menstrual pain, muscle pains/cramps, night sweats, nosebleeds, pregnancy symptoms, sleeping problems, sore throat, swelling of feet/legs, swollen lymph glands, thirst - excessive, urination - excessive, urination changes/pain, urine leakage, and vision changes.         SEE HPI  Vital Signs:  Patient profile:   65 year old female Height:      70 inches Weight:      194.38 pounds BMI:     27.99 BSA:     2.06 Pulse rate:   100 / minute Pulse rhythm:   regular BP sitting:   114 / 78  (left arm)  Vitals Entered By: Lamona Curl CMA Duncan Dull) (December 02, 2009 8:43 AM)  Physical Exam  General:  Well developed, well nourished, no acute distress. Head:  Normocephalic and atraumatic. Eyes:  PERRLA, no icterus. Neck:  Supple; no masses or thyromegaly. Lungs:  Clear throughout to auscultation. Heart:  Regular rate and rhythm; no murmurs, rubs,  or bruits. Abdomen:  SOFT,  MILD TENDERNESS EPIGASTRIUM, NO MASS OR HSM, NONDISTENDED, BS+ Rectal:  HEME NEGATIVE CURRENTLY, SMALL INTERNAL HEMORRHOID ON ANOSCOPY. Neurologic:  Alert and  oriented x4;  grossly normal neurologically. Psych:  Alert and cooperative. Normal mood and affect.   Impression & Recommendations:  Problem # 1:  HIATAL HERNIA (ICD-553.3) Assessment Deteriorated 64 YO FEMALE  WITH HX OF A LARGE MIXED TYPE HIATAL HERNIA-WITH CHRONIC ANEMIA DUE TO CAMERON EROSIONS, NOW S/P REPAIR AND NISSEN FUNDOPLICATION  7/11. PERSISTENT POST -OP DYSPEPSIA, GAS ,ERUCTATION,RELUX SXS. RESIDUAL SMAAL HITAL HERNIA ON UGI,WHICH ALSO SHOWS A RIGHT PARATRACHEAL  SOFT TISSUE PROMINENCE,ESOPHAGUS PUSHED TO THE RIGHT.  PT WITH HX OF LARGE SUBSTRENAL GOITER-S/P THYROIDECTOMY SEVERAL YEARS AGO-? REGROWTH OF GOITER,SUBSTERNALLY. WILL SCHEDULE CT CHEST CONTINUE PROTONIX 40 MG  TWICE DAILY FOR NOW ADD LEVSIN SL  AS NEEDED. MOST RECENT HGB 13.0 FOLLOW UP WITH DR. Juanda Chance  Problem # 2:  PERSONAL HX COLONIC POLYPS (ICD-V12.72) Assessment: Comment Only LAST COLON 2010-NO RECURRENT POLYPS  Problem # 3:  RECTAL BLEEDING (ICD-569.3) Assessment: New INTERNAL HEMORRHOIDAL  TRIAL OF BENEFIBER DAILY ANALPRAM 2.5% 3 X DAILY X 2 WEEKS THEN AS NEEDED  Problem # 4:  IRRITABLE BOWEL SYNDROME (ICD-564.1) Assessment: Comment Only  AS ABOVE  Problem # 5:  DIVERTICULOSIS, COLON (ICD-562.10) Assessment: Comment Only  Problem # 6:  FIBROMYALGIA (ICD-729.1) Assessment: Comment Only  Other Orders: GI Misc Procedure/ Radiology Order (GI Misc ) TLB-BMP (Basic Metabolic Panel-BMET) (80048-METABOL)  Patient Instructions: 1)  Please go to lab, basement level. 2)  We have scheduled the CT scan of the chest for today 12-02-09. 3)  Directions and brochure provided. 4)  Take Benefiber once daily in the morning. 5)  You can get that at [facility], [facility]. [facility] and most pharmacies. 6)  Continue the Protonix twice daily. 7)  We sent a  presccription for Analpram HC 1- 2.5 % cream to [facility] in [place]. 8)  Copy sent to : Dr Creola Corn 9)  The medication list was reviewed and reconciled.  All changed / newly prescribed medications were explained.  A complete medication list was provided to the patient / caregiver. Prescriptions: ANALPRAM-HC 1-2.5 % CREA (HYDROCORTISONE ACE-PRAMOXINE) Use the rectal cream 3 times daily x 14 days then as needed  #30 cc x 0   Entered by:   Lowry Ram NCMA   Authorized by:   Sammuel Cooper PA-c   Signed by:   Lowry Ram NCMA on 12/02/2009   Method used:   Electronically to        [facility] (retail)       [address]       [place], [place]  [address]       Ph: [phone]       Fax: [phone]   RxID:   [id number]

## 2010-05-03 NOTE — Discharge Summary (Signed)
Summary: Hosp Discharge Summary    NAMETAELYR, JANTZ                 ACCOUNT NO.:  [id number]      MEDICAL RECORD NO.:  [id number]          PATIENT TYPE:  INP      LOCATION:  1532                         FACILITY:  [facility]      PHYSICIAN:  Thornton Park. Daphine Deutscher, MD  DATE OF BIRTH:  Oct 14, 1945      DATE OF ADMISSION:  10/13/2009   DATE OF DISCHARGE:  10/18/2009                                  DISCHARGE SUMMARY      ADMITTING DIAGNOSIS:  Large type 3 mixed hiatal hernia with Sheria Lang   ulcers.      POSTOPERATIVE DIAGNOSIS:  Large type 3 mixed hiatal hernia with Cameron   ulcers.      PROCEDURE:  Laparoscopic takedown of a large complex hiatal hernia with   Nissen fundoplication over #56 lighted bougie with repair of the   diaphragm using human tissue mesh.      HOSPITAL COURSE:  This 65 year old lady underwent the above-mentioned   operation on July 13.  Postoperatively, we had a swallow, which showed   wrap intact.  She was started on liquids, but actually she had nausea   and they kept her in the hospital until postop day #5.  This seemed to   have resolve.  She was sent home with Phenergan suppositories to take if   needed for nausea and also we gave her some Phenergan.  Also, we are   some Roxicet elixir to take for pain.  Condition improved.  Medication   reconciliation accompanying the patient.  Basically, we will try to take   her off her amitriptyline and Xanax and her Protonix.  I will see her   back in 3-4 weeks.  In the meantime, she will be on a pureed diet for   about 4-5 weeks.  Condition improved.               Thornton Park Daphine Deutscher, MD            MBM/MEDQ  D:  10/18/2009  T:  10/19/2009  Job:  [id number]      cc:   Gwen Pounds, MD   Fax: [phone]      Electronically Signed by Luretha Murphy MD on 10/22/2009 08:29:41 AM

## 2010-05-03 NOTE — Progress Notes (Signed)
Summary: Iron infusion ordered  Phone Note Outgoing Call   Call placed by: Laureen Ochs LPN,  Aug 04, 1608 8:58 AM Call placed to: Patient Summary of Call: Follow-up from orders on pt. labs done 08-03-09. Pt. is scheduled for an Iron Infusion at [facility] on 08-06-09 at 8am. All instructions reviewed w/pt. by phone. Pt. instructed to call back as needed.  Initial call taken by: Laureen Ochs LPN,  Aug 05, 9602 8:59 AM

## 2010-05-03 NOTE — Progress Notes (Signed)
Summary: UGI/BA ESOPHAGRAM/SBFT SCHEDULED  Phone Note Outgoing Call   Call placed by: Laureen Ochs LPN,  July 08, 2009 4:55 PM Summary of Call: Pt. is scheduled for an UGI and Ba Esophagram for Hiatal Hernia and a SBFT to r/o Crohn's, at [facility] on 07-13-09 at 9am, NPO after 12mn. I will call pt. in the morning with appt. information. Initial call taken by: Laureen Ochs LPN,  July 08, 2009 4:57 PM  Follow-up for Phone Call        Above MD orders reviewed with patient, instructions reviewed by phone. Pt. instructed to call back as needed.  Follow-up by: Laureen Ochs LPN,  July 09, 2009 8:13 AM  New Problems: HIATAL HERNIA (ICD-553.3)   New Problems: HIATAL HERNIA (ICD-553.3)  Appended Document: UGI/BA ESOPHAGRAM/SBFT SCHEDULED please call pt with results, Again, very large hiatal hernia 2/3 of the stomach above the diaphragms, likely cause of GIBleed. SBFT is normal ( no Crohn's) , but very rapid transit time c/w IBS. Imodium two times a day may help to slow it down. Come to discuss  in the office.  Appended Document: UGI/BA ESOPHAGRAM/SBFT SCHEDULED Above MD orders reviewed with patient. Pt. to keep scheduled office visit on 08-03-09 at 1:45pm. Pt. instructed to call back as needed.

## 2010-05-03 NOTE — Letter (Signed)
Summary: [facility]  [facility]   Imported By: Lester [place]bot 06/29/2009 10:41:50  _____________________________________________________________________  External Attachment:    Type:   Image     Comment:   External Document

## 2010-05-03 NOTE — Letter (Signed)
Summary: [facility]   Imported By: Sherian Rein 09/06/2009 14:30:06  _____________________________________________________________________  External Attachment:    Type:   Image     Comment:   External Document

## 2010-05-03 NOTE — Letter (Signed)
Summary: [facility]   Imported By: Lester [place] 02/25/2010 11:34:42  _____________________________________________________________________  External Attachment:    Type:   Image     Comment:   External Document

## 2010-05-03 NOTE — Letter (Signed)
Summary: [facility]   Imported By: Lennie Odor 12/31/2009 14:19:35  _____________________________________________________________________  External Attachment:    Type:   Image     Comment:   External Document

## 2010-05-03 NOTE — Progress Notes (Signed)
Summary: triage  Phone Note Call from Patient Call back at Home Phone [phone] Call back at [phone]   Caller: Patient Call For: Dr. Juanda Chance Reason for Call: Talk to Nurse Summary of Call: rectal bleeding... Dr. Daphine Deutscher is not concerned and told pt that it is most likely bleeding hemorrhoids and told pt to apply tea bags... pt did and bleeding stopped... pt says she has "a whole bunch of other issues" going on and will be leaving for a cruise next month and would like to have these problems solved before her trip Initial call taken by: Vallarie Mare,  November 29, 2009 3:11 PM  Follow-up for Phone Call        Left message for patient to call back Darcey Nora RN, [facility]  November 29, 2009 3:37 PM  Patient with continued reflux despite recent NISSEN 7 weeks ago.  Dr Daphine Deutscher stopped her pantoprazole and has been told not to resume it.  Patient has recently had an episode of  fecal incontinence.  She is going on a cruise in September.  No further rectal bleeding.  She will come in and see Mike Gip PA 12/02/09 8:30 Follow-up by: Darcey Nora RN, CGRN,  November 29, 2009 4:17 PM

## 2010-05-03 NOTE — Progress Notes (Signed)
Summary: Triage--Endoscopy Scheduled  Phone Note Call from Patient Call back at [phone]   Caller: Spouse Tom Call For: Juanda Chance Reason for Call: Talk to Nurse Summary of Call: Spouse would like to speak directly to Dr Juanda Chance because insurance wont pay for the procedure that she is scheduled for. Mr. Elijah Birk states that the insurance company needs to speak to Dr Juanda Chance regarding problem. Initial call taken by: Tawni Levy,  July 05, 2009 3:57 PM  Follow-up for Phone Call        Per Alden Benjamin is denying Cap. Endo., they require an acute drop in HGB and or HCT, and on pt. last Endo. it shows an ulceration, may need a f/u Endo.  Medicare will cover, but pt. will be responsible for whatever it doesn't pay.  Mr. gartland has multiple questions about precert and costs, I will refer him to our Precert person, Morrie Sheldon.  Follow-up by: Laureen Ochs LPN,  July 05, 2009 4:18 PM  Additional Follow-up for Phone Call Additional follow up Details #1::        Answered questions regarding pricing and authorization; Per Patient's conversation with [employer] of [place], next step is a Peer-to-Peer Review.  DR.Cleven Jansma PLEASE CALL: [phone] ext [phone] to speak to the Medical Director. Additional Follow-up by: Dwan Bolt,  July 05, 2009 4:28 PM    Additional Follow-up for Phone Call Additional follow up Details #2::    It is going to take few days before I find time for this lengthy call.  I'd rather not order the test and do EGD as they suggest. Please schedule. Follow-up by: Hart Carwin MD,  July 06, 2009 8:57 AM  Additional Follow-up for Phone Call Additional follow up Details #3:: Details for Additional Follow-up Action Taken: Above MD orders reviewed with patient and her husband. She has scheduled her previsit for 07-07-09 at 10am and her Endoscopy for 07-08-09 at 3:30pm in [facility]. Pt. instructed to call back as needed.  Additional Follow-up by: Laureen Ochs LPN,  July 06, 2009 9:57 AM

## 2010-05-03 NOTE — Letter (Signed)
Summary: EGD Instructions  [facility]  [address] [place], [place] [address]   Phone: [phone]  Fax: [phone]       Brandy Golden    04-05-1945    MRN: [id number]       Procedure Day /Date: Thursday  07/08/09     Arrival Time:   2:30pm     Procedure Time: 3:30pm    Location of Procedure:                    _ X _ [facility] (4th Floor)    PREPARATION FOR ENDOSCOPY   On Thursday 04/07  THE DAY OF THE PROCEDURE:  1.   No solid foods, milk or milk products are allowed after midnight the night before your procedure.  2.   Do not drink anything colored red or purple.  Avoid juices with pulp.  No orange juice.  3.  You may drink clear liquids until 1:30pm  which is 2 hours before your procedure.                                                                                                CLEAR LIQUIDS INCLUDE: Water Jello Ice Popsicles Tea (sugar ok, no milk/cream) Powdered fruit flavored drinks Coffee (sugar ok, no milk/cream) Gatorade Juice: apple, white grape, white cranberry  Lemonade Clear bullion, consomm, broth Carbonated beverages (any kind) Strained chicken noodle soup Hard Candy   MEDICATION INSTRUCTIONS  Unless otherwise instructed, you should take regular prescription medications with a small sip of water as early as possible the morning of your procedure.    Additional medication instructions: Hold LIsinopril/HCTZ the morning of procedure.             OTHER INSTRUCTIONS  You will need a responsible adult at least 67 years of age to accompany you and drive you home.   This person must remain in the waiting room during your procedure.  Wear loose fitting clothing that is easily removed.  Leave jewelry and other valuables at home.  However, you may wish to bring a book to read or an iPod/MP3 player to listen to music as you wait for your procedure to start.  Remove all body piercing jewelry and leave at  home.  Total time from sign-in until discharge is approximately 2-3 hours.  You should go home directly after your procedure and rest.  You can resume normal activities the day after your procedure.  The day of your procedure you should not:   Drive   Make legal decisions   Operate machinery   Drink alcohol   Return to work  You will receive specific instructions about eating, activities and medications before you leave.    The above instructions have been reviewed and explained to me by  Wyona Almas RN  July 07, 2009 10:29 AM     I fully understand and can verbalize these instructions _____________________________ Date _________

## 2010-05-03 NOTE — Letter (Signed)
Summary: [facility]   Imported By: Sherian Rein 11/19/2009 14:47:13  _____________________________________________________________________  External Attachment:    Type:   Image     Comment:   External Document

## 2010-05-03 NOTE — Letter (Signed)
Summary: [facility]  [facility]   Imported By: Sherian Rein 06/30/2009 11:04:47  _____________________________________________________________________  External Attachment:    Type:   Image     Comment:   External Document

## 2010-05-03 NOTE — Letter (Signed)
Summary: Patient [facility] Biopsy Results  [facility]  [address] [place], [place] [address]   Phone: [phone]  Fax: [phone]        July 12, 2009 MRN: [id number]    [facility] [address] [place], [place]  [address]    Dear Ms. Sohm,  I am pleased to inform you that the biopsies taken during your recent endoscopic examination did not show any evidence of cancer upon pathologic examination.The biopsies show inflammation  consistent with gastritis. These are causing the bleeding  Additional information/recommendations:  __No further action is needed at this time.  Please follow-up with      your primary care physician for your other healthcare needs.  __ Please call [phone] to schedule a return visit to review      your condition.  _x_ Continue with the treatment plan as outlined on the day of your      exam.  __   Please call us if you are having persistent problems or have questions about your condition that have not been fully answered at this time.  Sincerely,  Hart Carwin MD  This letter has been electronically signed by your physician.  Appended Document: Patient Notice-Endo Biopsy Results letter mailed 04.13.11

## 2010-05-03 NOTE — Procedures (Signed)
Summary: Instruction for procedure/[facility]  Instruction for procedure/[facility]   Imported By: Sherian Rein 07/12/2009 12:10:22  _____________________________________________________________________  External Attachment:    Type:   Image     Comment:   External Document

## 2010-05-03 NOTE — Miscellaneous (Signed)
Summary: [facility] Previsit/prep  Clinical Lists Changes  Observations: Added new observation of NKA: T (07/07/2009 9:52)

## 2010-05-03 NOTE — Progress Notes (Signed)
Summary: CT ?'s  Phone Note Call from Patient Call back at [phone]   Caller: Patient Call For: Dr. Juanda Chance Reason for Call: Talk to Nurse Summary of Call: has questions regarding CT instructions Initial call taken by: Vallarie Mare,  December 02, 2009 10:31 AM  Follow-up for Phone Call        Questions answered, she will call back for further questions.

## 2010-05-05 NOTE — Letter (Signed)
Summary: [facility]   Imported By: Lester [place] 04/07/2010 09:20:38  _____________________________________________________________________  External Attachment:    Type:   Image     Comment:   External Document

## 2010-05-25 ENCOUNTER — Encounter: Payer: Self-pay | Admitting: Internal Medicine

## 2010-05-25 ENCOUNTER — Ambulatory Visit (INDEPENDENT_AMBULATORY_CARE_PROVIDER_SITE_OTHER): Payer: Medicare Other | Admitting: Internal Medicine

## 2010-05-25 DIAGNOSIS — K219 Gastro-esophageal reflux disease without esophagitis: Secondary | ICD-10-CM

## 2010-05-25 DIAGNOSIS — R1319 Other dysphagia: Secondary | ICD-10-CM

## 2010-05-31 ENCOUNTER — Encounter: Payer: Self-pay | Admitting: Internal Medicine

## 2010-05-31 ENCOUNTER — Other Ambulatory Visit (AMBULATORY_SURGERY_CENTER): Payer: Medicare Other | Admitting: Internal Medicine

## 2010-05-31 DIAGNOSIS — R933 Abnormal findings on diagnostic imaging of other parts of digestive tract: Secondary | ICD-10-CM

## 2010-05-31 DIAGNOSIS — K219 Gastro-esophageal reflux disease without esophagitis: Secondary | ICD-10-CM

## 2010-05-31 NOTE — Assessment & Plan Note (Addendum)
Summary: F/U AFTER CT...EM  SCH'D W/PT  CX FEE ADVISED   Vital Signs:  Patient profile:   65 year old female Height:      70 inches Weight:      202 pounds BMI:     29.09 Pulse rate:   100 / minute Pulse rhythm:   regular BP sitting:   102 / 76  (left arm)  Vitals Entered By: Milford Cage NCMA (May 25, 2010 11:22 AM)  History of Present Illness Visit Type: Follow-up Visit Primary GI MD: Lina Sar MD Primary Provider: Creola Corn, MD Requesting Provider: n/a Chief Complaint: follow-up CT Scan and barium swallow still has complaints of chest pressure at night and some regurgitation/food and liquids not going down. History of Present Illness:   This is a 65 year old white female with a history of a large hiatal hernia. She is status post Nissen fundoplication on 10/13/09 by Dr. Daphine Deutscher. She had chronic GI blood loss due to [facility] ulcers. Postoperatively, she was found to have an abnormality in her superior mediastinum consistent with substernal extension of the thyroid goiter. This prominent tissue deviates the upper thoracic esophagus to the right and causes occasional difficulty in swallowing. This was demonstrated on a CT scan of the chest on 12/02/09 and also on an upper GI series in 12/01/09 showing tertiary esophageal contractions more mild-to-moderate. She had normal passage of a 13 mm barium tablet without delay. There was a tiny hiatal hernia but no significant reflux. She has been followed by Dr. Evlyn Kanner for thyroid problems. She is concerned about her thyroid goiter and the fact that it may be growing back. She has been on pantoprazole 40 mg daily without significant reflux episodes except at night when she lies down.   GI Review of Systems    Reports abdominal pain, chest pain, and  nausea.     Location of  Abdominal pain: upper abdomen.    Denies acid reflux, belching, bloating, dysphagia with liquids, dysphagia with solids, heartburn, loss of appetite, vomiting, vomiting blood,  weight loss, and  weight gain.        Denies anal fissure, black tarry stools, change in bowel habit, constipation, diarrhea, diverticulosis, fecal incontinence, heme positive stool, hemorrhoids, irritable bowel syndrome, jaundice, light color stool, liver problems, rectal bleeding, and  rectal pain.  Current Medications (verified): 1)  Alprazolam 0.5 Mg Tabs (Alprazolam) .... Take 1 Tablet By Mouth At Night  As Needed 2)  Fexofenadine Hcl 180 Mg Tabs (Fexofenadine Hcl) .... One Daily As Needed 3)  Sertraline Hcl 100 Mg Tabs (Sertraline Hcl) .... One Tablet By Mouth Daily 4)  Synthroid 125 Mcg Tabs (Levothyroxine Sodium) .... One Tablet By Mouth Monday, Tuesday, Friday, Saturday.  On Wednesday Take 1/2 Tablet. Take Nothing On Sunday 5)  Calcium-Vitamin D 600-125 Mg-Unit Tabs (Calcium-Vitamin D) .... One Tablet Two Times A Day 6)  Senior Multivitamin Plus  Tabs (Multiple Vitamins-Minerals) .... One Tablet By Mouth Once Daily 7)  Pantoprazole Sodium 40 Mg Tbec (Pantoprazole Sodium) .... One Tablet By Mouth Two Times A Day 8)  Flonase 50 Mcg/act Susp (Fluticasone Propionate) .... Two Sprays in Each Nostril At Bedtime As Needed 9)  Analpram-Hc 1-2.5 % Crea (Hydrocortisone Ace-Pramoxine) .... Use The Rectal Cream 3 Times Daily X 14 Days Then As Needed 10)  Lisinopril Hctz (Unknown Strength) .Marland Kitchen.. 1 By Mouth Once Daily  Allergies (verified): No Known Drug Allergies  Past History:  Past Medical History: Reviewed history from 12/02/2009 and no changes required.  Current Problems:  COLONIC POLYPS (ICD-211.3) FIBROCYSTIC BREAST DISEASE (ICD-610.1) HYPERTENSION (ICD-401.9) ANXIETY (ICD-300.00) Hx of CARCINOMA, BASAL CELL (ICD-173.9) UNSPECIFIED HYPOTHYROIDISM (ICD-244.9) IRRITABLE BOWEL SYNDROME (ICD-564.1) FIBROMYALGIA (ICD-729.1) DIVERTICULOSIS, COLON (ICD-562.10) INTERNAL HEMORRHOIDS (ICD-455.0) GASTROINTESTINAL HEMORRHAGE, HX OF (ICD-V12.79) GERD (ICD-530.81) DIARRHEA  (ICD-787.91) ANEMIA, IRON DEFICIENCY (ICD-280.9)-CAMERON EROSIONS,LARGE HIATAL HERNIA      Past Surgical History: Reviewed history from 12/02/2009 and no changes required. Hysterectomy Left Foot Surgery Submucosal Resection thyroidectomy LAPAROSCOPIC REPAIR OF LARGE HIATAL HERNIA/NISSEN 7/11-DR. MARTIN  COLONOSCOPY 9/10-LEFT COLON DIVERTICULOSIS  Family History: Reviewed history from 12/02/2009 and no changes required. Family History of Breast Cancer: Sister  No FH of Colon Cancer: Family History of Diabetes: Father, 2 Aunts, Cousin Family History of Colitis:First Cousin  Family History of Heart Disease: Mother-MI @ 61 CVA-Father   Social History: Reviewed history from 11/26/2008 and no changes required. Occupation: Retired Runner, broadcasting/film/video Married  2 boys  Alcohol Use - yes-occasional wine Illicit Drug Use - no Patient has never smoked.   Review of Systems  The patient denies allergy/sinus, anemia, anxiety-new, arthritis/joint pain, back pain, blood in urine, breast changes/lumps, change in vision, confusion, cough, coughing up blood, depression-new, fainting, fatigue, fever, headaches-new, hearing problems, heart murmur, heart rhythm changes, itching, menstrual pain, muscle pains/cramps, night sweats, nosebleeds, pregnancy symptoms, shortness of breath, skin rash, sleeping problems, sore throat, swelling of feet/legs, swollen lymph glands, thirst - excessive , urination - excessive , urination changes/pain, urine leakage, vision changes, and voice change.         .   Impression & Recommendations:  Problem # 1:  HIATAL HERNIA (ICD-553.3) Patient is status post Nissen fundoplication for a large hiatal hernia causing chronic GI blood loss. Overall, patient is markedly improved but has had residual esophageal dysmotility as per tertiary contractions on UGI.  She also has a deviation of the esophagus due to substernal goiter. There is a question of mediastinal adenopathy on her CT  scan of the chest. She is concerned about the possibility of not being able to vomitwhen she gets nauseated due to Fundoplication.  Her gastric emptying in the past was normal as was her small bowel follow-through in 2010.( rapid transit),  Problem # 2:  UNSPECIFIED HYPOTHYROIDISM (ICD-244.9) Patient was found to have substernal soft tissue on CT scan of the chest and is treated by Dr. Evlyn Kanner for a substernal goiter. She is status post remote thyroidectomy. Patient is concerned about the goiter growing. She may need further evaluation from Dr. Evlyn Kanner.  Problem # 3:  PERSONAL HX COLONIC POLYPS (ICD-V12.72) Patient is status post colonoscopy in September 2010 which was a normal exam. She is due for a recall in September 2020.  Other Orders: EGD (EGD)  Patient Instructions: 1)  You have been scheduled for an endoscopy to assess esophageal anatomy and fundiplication, Please follow written prep instructions that were given to you today at your visit.  2)  Copy sent to : Dr Timothy Lasso, Dr M.Martin, Dr Ardyth Harps 3)  Take pantoprazole 40 mg at bedtime instead of in am. 4)  Use Simethicon for gas bloat syndrome. 5)  Return to Dr. Evlyn Kanner for followup of substernal goiter versus mediastinal adenopathy. May need repeat thyroid scan for identification of substernal tissue collection 6)  The medication list was reviewed and reconciled.  All changed / newly prescribed medications were explained.  A complete medication list was provided to the patient / caregiver.   Orders Added: 1)  EGD [EGD]  Appended Document: F/U AFTER CT...EM  SCH'D W/PT  CX FEE ADVISED    Clinical Lists Changes  Medications: Changed medication from ALPRAZOLAM 0.5 MG TABS (ALPRAZOLAM) Take 1 tablet by mouth at night  as needed to ALPRAZOLAM 0.5 MG TABS (ALPRAZOLAM) Take 1 tablet by mouth two times a day as needed Removed medication of FLONASE 50 MCG/ACT SUSP (FLUTICASONE PROPIONATE) two sprays in each nostril at bedtime as  needed Changed medication from * LISINOPRIL HCTZ (UNKNOWN STRENGTH) 1 by mouth once daily to LISINOPRIL-HYDROCHLOROTHIAZIDE 20-12.5 MG TABS (LISINOPRIL-HYDROCHLOROTHIAZIDE) Take 1 tablet by mouth once a day Added new medication of ASPIRIN 81 MG TBEC (ASPIRIN) Take 1 tablet by mouth once a day Added new medication of FISH OIL 1000 MG CAPS (OMEGA-3 FATTY ACIDS) Take 1 capsule by mouth once daily

## 2010-05-31 NOTE — Letter (Signed)
Summary: EGD Instructions  [facility]  [address] [place], [place] [address]   Phone: [phone]  Fax: [phone]       Brandy Golden    [birth year]    MRN: [id number]       Procedure Day /Date: Tuesday 05/31/10     Arrival Time: 2:00 pm     Procedure Time: 3:00 pm     Location of Procedure:                    _ x _ [facility] (4th Floor)  PREPARATION FOR ENDOSCOPY   On 05/31/10 THE DAY OF THE PROCEDURE:  1.   No solid foods, milk or milk products are allowed after midnight the night before your procedure.  2.   Do not drink anything colored red or purple.  Avoid juices with pulp.  No orange juice.  3.  You may drink clear liquids until 1:00 pm, which is 2 hours before your procedure.                                                                                                CLEAR LIQUIDS INCLUDE: Water Jello Ice Popsicles Tea (sugar ok, no milk/cream) Powdered fruit flavored drinks Coffee (sugar ok, no milk/cream) Gatorade Juice: apple, white grape, white cranberry  Lemonade Clear bullion, consomm, broth Carbonated beverages (any kind) Strained chicken noodle soup Hard Candy   MEDICATION INSTRUCTIONS  Unless otherwise instructed, you should take regular prescription medications with a small sip of water as early as possible the morning of your procedure.                    OTHER INSTRUCTIONS  You will need a responsible adult at least 67 years of age to accompany you and drive you home.   This person must remain in the waiting room during your procedure.  Wear loose fitting clothing that is easily removed.  Leave jewelry and other valuables at home.  However, you may wish to bring a book to read or an iPod/MP3 player to listen to music as you wait for your procedure to start.  Remove all body piercing jewelry and leave at home.  Total time from sign-in until discharge is approximately 2-3 hours.  You should  go home directly after your procedure and rest.  You can resume normal activities the day after your procedure.  The day of your procedure you should not:   Drive   Make legal decisions   Operate machinery   Drink alcohol   Return to work  You will receive specific instructions about eating, activities and medications before you leave.    The above instructions have been reviewed and explained to me by   _______________________    I fully understand and can verbalize these instructions _____________________________ Date _________

## 2010-06-09 NOTE — Procedures (Signed)
Summary: Upper Endoscopy  Patient: Brandy Golden Note: All result statuses are Final unless otherwise noted.  Tests: (1) Upper Endoscopy (EGD)   EGD Upper Endoscopy       DONE     [facility]     [address]     [place], [place]  [address]           ENDOSCOPY PROCEDURE REPORT           PATIENT:  Brandy Golden, Brandy Golden  MR#:  [id number]     BIRTHDATE:  02-23-1946, 64 yrs. old  GENDER:  female           ENDOSCOPIST:  Hedwig Morton. Juanda Chance, MD     Referred by:  Creola Corn, M.D.           PROCEDURE DATE:  05/31/2010     PROCEDURE:  EGD, diagnostic 43235     ASA CLASS:  Class II     INDICATIONS:  dysphagia chest pressure at night,,substernal goiter     deviates proximal esophagus, tertiary es. contractions on Ba     swallow after Nissen Fundoplication 10/2009           MEDICATIONS:   Versed 10 mg, Fentanyl 100 mcg, Benadryl 12.5 mg     TOPICAL ANESTHETIC:  Exactacain Spray           DESCRIPTION OF PROCEDURE:   After the risks benefits and     alternatives of the procedure were thoroughly explained, informed     consent was obtained.  The LB [id number]-H180 [id number] endoscope was     introduced through the mouth and advanced to the second portion of     the duodenum, without limitations.  The instrument was slowly     withdrawn as the mucosa was fully examined.     <<PROCEDUREIMAGES>>           The esophagus and gastroesophageal junction were completely normal     in appearance (see image1, image13, image12, and image14). mild     deviation of proximal esophagus without obstruction  s/p     fundoplication (see image7).  A hiatal hernia was found (see     image9, image10, image8, and image11). 3 cm nonreducible hiatal     hernia with retained food particles, no Cameron erosions     Otherwise the examination was normal (see image5, image6, image4,     and image2).    Retroflexed views revealed no abnormalities.     The scope was then withdrawn from the patient and the procedure     completed.          COMPLICATIONS:  None           ENDOSCOPIC IMPRESSION:     1) Normal esophagus     2) S/p fundoplication     3) Hiatal hernia     4) Otherwise normal examination     s/p functioninf fundoplication, no obstruction,     retained food insmall hiatal herni     overall pt's reflux has been significantly improved,     RECOMMENDATIONS:     continue Pantoprazole           REPEAT EXAM:  In 0 year(s) for.  may need repeat Ba esophagram           ______________________________     Hedwig Morton. Juanda Chance, MD           CC:           n.  eSIGNED:   Hedwig Morton. Brodie at 05/31/2010 04:01 PM           Arnoldo Hooker, [id number]  Note: An exclamation mark (!) indicates a result that was not dispersed into the flowsheet. Document Creation Date: 05/31/2010 4:01 PM _______________________________________________________________________  (1) Order result status: Final Collection or observation date-time: 05/31/2010 15:44 Requested date-time:  Receipt date-time:  Reported date-time:  Referring Physician:   Ordering Physician: Lina Sar [phone]) Specimen Source:  Source: Launa Grill Order Number: [id number] Lab site:

## 2010-06-14 NOTE — Letter (Signed)
Summary: Medication List/Patient  Medication List/Patient   Imported By: Sherian Rein 06/06/2010 07:58:44  _____________________________________________________________________  External Attachment:    Type:   Image     Comment:   External Document

## 2010-06-18 LAB — BASIC METABOLIC PANEL
BUN: 7 mg/dL (ref 6–23)
CO2: 28 mEq/L (ref 19–32)
Calcium: 7.8 mg/dL — ABNORMAL LOW (ref 8.4–10.5)
Chloride: 105 mEq/L (ref 96–112)
Creatinine, Ser: 0.77 mg/dL (ref 0.4–1.2)
GFR calc Af Amer: >60 mL/min (ref >60)
GFR calc non Af Amer: >60 mL/min (ref >60)
Glucose, Bld: 149 mg/dL — ABNORMAL HIGH (ref 70–99)
Potassium: 4.4 mEq/L (ref 3.5–5.1)
Sodium: 137 mEq/L (ref 135–145)

## 2010-06-18 LAB — CBC
HCT: 32.2 % — ABNORMAL LOW (ref 36.0–46.0)
Hemoglobin: 10.6 g/dL — ABNORMAL LOW (ref 12.0–15.0)
MCH: 28.3 pg (ref 26.0–34.0)
MCHC: 33 g/dL (ref 30.0–36.0)
MCV: 85.7 fL (ref 78.0–100.0)
Platelets: 145 10*3/uL — ABNORMAL LOW (ref 150–400)
RBC: 3.76 MIL/uL — ABNORMAL LOW (ref 3.87–5.11)
RDW: 21.2 % — ABNORMAL HIGH (ref 11.5–15.5)
WBC: 5.7 10*3/uL (ref 4.0–10.5)

## 2010-06-18 LAB — DIFFERENTIAL
Basophils Relative: 0 % (ref 0–1)
Eosinophils Relative: 1 % (ref 0–5)
Monocytes Absolute: 0.5 10*3/uL (ref 0.1–1.0)
Monocytes Relative: 8 % (ref 3–12)
Neutro Abs: 4.6 10*3/uL (ref 1.7–7.7)
Neutrophils Relative %: 81 % — ABNORMAL HIGH (ref 43–77)

## 2010-06-19 LAB — SURGICAL PCR SCREEN
MRSA, PCR: NEGATIVE
Staphylococcus aureus: NEGATIVE

## 2010-06-19 LAB — COMPREHENSIVE METABOLIC PANEL
AST: 27 U/L (ref 0–37)
BUN: 19 mg/dL (ref 6–23)
Calcium: 9.6 mg/dL (ref 8.4–10.5)
Chloride: 104 mEq/L (ref 96–112)
GFR calc Af Amer: >60 mL/min (ref >60)
Glucose, Bld: 104 mg/dL — ABNORMAL HIGH (ref 70–99)
Total Protein: 7.3 g/dL (ref 6.0–8.3)

## 2010-06-19 LAB — CBC
HCT: 34.7 % — ABNORMAL LOW (ref 36.0–46.0)
HCT: 37.9 % (ref 36.0–46.0)
Hemoglobin: 12.7 g/dL (ref 12.0–15.0)
MCH: 27.7 pg (ref 26.0–34.0)
MCH: 28.5 pg (ref 26.0–34.0)
MCHC: 33.4 g/dL (ref 30.0–36.0)
MCV: 82.9 fL (ref 78.0–100.0)
RBC: 4.57 MIL/uL (ref 3.87–5.11)
RDW: 21 % — ABNORMAL HIGH (ref 11.5–15.5)
RDW: 23.4 % — ABNORMAL HIGH (ref 11.5–15.5)

## 2010-07-01 ENCOUNTER — Other Ambulatory Visit: Payer: Self-pay | Admitting: Internal Medicine

## 2010-07-01 DIAGNOSIS — E049 Nontoxic goiter, unspecified: Secondary | ICD-10-CM

## 2010-07-06 ENCOUNTER — Ambulatory Visit
Admission: RE | Admit: 2010-07-06 | Discharge: 2010-07-06 | Disposition: A | Discharge status: Home / Self Care | Payer: Medicare Other | Source: Ambulatory Visit | Attending: Internal Medicine | Admitting: Internal Medicine

## 2010-07-06 MED ORDER — IOHEXOL 300 MG/ML  SOLN
75.0000 mL | Freq: Once | INTRAMUSCULAR | Status: AC | PRN
  Administered 2010-07-06: 75 mL via INTRAVENOUS

## 2010-08-19 NOTE — Discharge Summary (Signed)
Brandy Golden, Brandy Golden                           ACCOUNT NO.:  [id number]   MEDICAL RECORD NO.:  [id number]                   PATIENT TYPE:  INP   LOCATION:  5704                                 FACILITY:  [facility]   PHYSICIAN:  Velora Heckler, M.D.                DATE OF BIRTH:  September 16, 1945   DATE OF ADMISSION:  04/08/2002  DATE OF DISCHARGE:  04/10/2002                                 DISCHARGE SUMMARY   REASON FOR ADMISSION:  Substernal thyroid goiter.   HISTORY OF PRESENT ILLNESS:  The patient is a 65 year old white female who  presents at the request of Jeannett Senior A. Saint Martin, M.D., with a 15-year history of  thyroid goiter.  She has developed mild dysphagia.  CT scan shows a large  substernal mass in the left neck extending down to the level of the carina.  There is significant tracheal deviation and displacement of the esophagus.  The patient is mildly hyperthyroid.  The patient now comes to surgery for  thyroidectomy.   HOSPITAL COURSE:  The patient was admitted on the morning of April 08, 2002, and taken directly to the operating room.  She underwent total  thyroidectomy through a cervical incision.  I was assisted by Ines Bloomer, M.D., from thoracic surgery.  The patient did well in the operating  room.  Postoperatively, her course was complicated by headache and nausea.  This gradually resolved.  She was seen postoperatively by Tera Mater. Evlyn Kanner,  M.D., from endocrine.  The patient's calcium levels stabilized in the range  of 7.8-8.0 on oral supplementation.  She was prepared for discharge home on  the second postoperative day.   DISPOSITION:  The patient was discharged home on April 10, 2002, in good  condition, tolerating a regular diet, and ambulating independently.   DISCHARGE MEDICATIONS:  1. Vicodin as needed for pain.  2. Calcium carbonate with vitamin B 1000 mg three times daily.  3. Synthroid 0.125 mg daily.   Other medications are as per usual.   FOLLOW-UP:   The patient will be seen back at my office at [facility] in two weeks.  The patient will be seen back by Jeannett Senior A. Evlyn Kanner,  M.D.   FINAL DIAGNOSIS:  Multinodular goiter.  No evidence of malignancy.  Large  substernal component with tracheal and esophageal deviation.   CONDITION ON DISCHARGE:  Improved.                                               Velora Heckler, M.D.    TMG/MEDQ  D:  04/10/2002  T:  04/10/2002  Job:  [id number]   cc:   Jeannett Senior A. Evlyn Kanner, M.D.  [address]  [place]  [place] [address]  Fax: [phone]  Ines Bloomer, M.D.  [address]  [place]  [place] [address]  Fax: [phone]   [facility]  [place], [place]

## 2010-08-19 NOTE — H&P (Signed)
NAMECARISHA, Brandy Golden                 ACCOUNT NO.:  [id number]   MEDICAL RECORD NO.:  [id number]          PATIENT TYPE:  INP   LOCATION:  6714                         FACILITY:  [facility]   PHYSICIAN:  Gaspar Garbe, M.D.DATE OF BIRTH:  [birth year]   DATE OF ADMISSION:  10/28/2004  DATE OF DISCHARGE:                                HISTORY & PHYSICAL   CHIEF COMPLAINT:  Melena.   HISTORY OF PRESENT ILLNESS:  Patient is a 65 year old white female with a  history of iron-deficiency anemia and irritable bowel syndrome who has had  previous negative workups done in [facility] by gastroenterologists which  included endoscopy and colonoscopy.  Patient was seen in our office for a  routine visit a couple days ago and indicated that her stomach had been  bothering her.  She had a hemoglobin at that point that was in the 12.5  range.  She indicates that she started having a little bit more cramping and  then this morning felt particularly nauseated, forced herself to vomit and  noted some brown coffee grounds in it.  Patient has had one normal bowel  movement today and subsequently four melanotic stools.  Patient called our  office at [facility] and was seen as a work-in patient.  Hemoglobin and hematocrit at that time showed a hemoglobin of 10.9 which is  down from 12.6 only 2 days prior.  Patient also appeared to be somewhat  diaphoretic with a rapid heart rate and clearly did not look well.  Patient  is therefore being electively admitted to the hospital for what is most  likely an upper GI bleed and to replace her volume status and transfusion if  necessary.   PAST MEDICAL HISTORY:  1.  Iron-deficiency anemia with negative workup as above.  2.  History of IBS.  3.  GERD with esophagitis.  4.  Goiter status post resection leaving her hypothyroid.  5.  Basal cell carcinoma of the skin.  6.  Fibrocystic breast disease.  7.  Allergic rhinitis.  8.  Hypertension.  9.   Anxiety.   SURGICAL HISTORY:  Patient has undergone TAH/BSO in 1984; she has had foot  surgery, breast biopsies, rhinoplasty and thyroid removal by Dr. Darnell Level  was done April 08, 2002.   SOCIAL HISTORY:  Patient lives in [place] with her husband.  She is an Clinical cytogeneticist.  She is married with two children and a nonsmoker/nondrinker.  She denies any over-the-counter usage of Tylenol or aspirin or nonsteroidal  anti-inflammatories.   FAMILY HISTORY:  Mother died at age 66 of a heart attack.  Father died age  46 of CVA, also had a history of diabetes mellitus type 2.  She has had a  sister with breast cancer and family history of goiter as well.   ALLERGIES:  MYCIN'S cause her to have a rash.   MEDICATIONS:  1.  Elavil 50 mg p.o. at bedtime.  2.  Aciphex 20 mg p.o. b.i.d.  3.  Synthroid 125 mcg p.o. once daily.  4.  Xanax 0.5 mg p.o. p.r.n.  5.  Zoloft 50 mg p.o. once daily.  6.  NuLev 0.125 mg p.o. p.r.n.  7.  Calcium supplements.  8.  Multivitamin.  9.  Aspirin 81 mg p.o. once daily.  10. Allegra-D one tablet twice daily as needed.  11. Benicar 20/12.5 one tablet once daily.   REVIEW OF SYSTEMS:  Patient denies fevers, chills, or sweats, although she  is somewhat diaphoretic at this point.  Denies any sort of headache,  indicates no chest pain, shortness of breath, or dyspnea on exertion at this  time, although her heart does feel like it is going quickly.  GU:  Patient  denies any urinary symptoms.  ABDOMEN:  Patient had nausea earlier to the  point of self-induced vomiting which did not contain any bright red blood  but only coffee grounds x1.  She has had loose tarry bowel movements as  noted above x4.  MUSCULOSKELETAL:  No joint deformities are noted.   ADVANCED DIRECTIVES:  Patient is a Full Code.   PHYSICAL EXAMINATION:  VITAL SIGNS:  Temperature 98.6, pulse 133,  respiratory rate 22, blood pressure 134/84 saturating 98% on room air.  GENERAL:  She does not  appear to be in any acute distress, not particularly  uncomfortable but does not look particularly well either.  HEENT:  Normocephalic, atraumatic.  PERRLA.  EOMI.  ENT is within normal  limits.  Neck supple.  No lymphadenopathy, JVD, or bruit.  HEART:  Tachycardic with regular rhythm consistent with sinus tachycardia.  There is no rub or gallop appreciated.  LUNGS:  Clear to auscultation bilaterally.  ABDOMEN:  Slightly hyperactive bowel sounds with diffuse mild tenderness and  bloating.  Patient does not have any point tenderness or any rebound.  RECTAL:  Patient is guaiac positive.  No gross blood is seen on rectal exam,  only melena.  EXTREMITIES:  No clubbing, cyanosis, or edema.  MUSCULOSKELETAL:  No joint deformities are noted.  NEUROLOGICAL:  Cranial nerves II-XII are intact. 1+ deep tendon reflexes  bilaterally and no lateralization.   LABORATORIES:  CBC done on October 26, 2004 shows a hemoglobin of 12.6 with a  hematocrit of 37.9, that done today shows a hemoglobin of 10.9 and  hematocrit 32.6.  White count currently 8.9 with platelets 273.  Other labs  are pending on admission.   ASSESSMENT AND PLAN:  1.  Likely upper gastrointestinal bleed.  I do not feel that her self-      induced vomiting is most likely the cause but it may have exacerbated      this some as she describes coffee grounds and not bright red blood on      vomiting.  She had most likely had nausea related to an ongoing bleed      and subsequent melanotic stools.  Given her fast heart rate and the fact      that she just simply does not feel well, spoke to her at length in the      office, and we elected to admit her to the hospital where she will      receive IV fluids and we will follow her blood counts every 6 hours as      needed and we will transfuse her as needed.  I spoke with Claudette Head     with [facility] who indicated that Jeani Hawking was on call for their      group and that he would pass the  message that we required consultation  for the purpose of most likely an endoscopy along to him.  If she      becomes hemodynamically unstable, we will have him see her sooner for      possibly emergent EGD.  However, at this time I feel that we can      stabilize her and that this could most likely be done in the morning.      She will be on IV Protonix to replace her Aciphex and all aspirin and      nonsteroidals will be held.  2.  Hypertension.  I have held her Benicar HCT as she is being hydrated with      IV normal saline at 125 mL/hr and will most likely need volume and blood      pressure should she develop worsening bleeding.  3.  Anxiety.  We will continue the patient on her Zoloft as written or her      Xanax as needed, may need to be increased in the short term and may have      some effect on lowering her heart rate as well.  4.  Hypothyroidism.  We will continue her on her Synthroid.       RWT/MEDQ  D:  10/28/2004  T:  10/28/2004  Job:  [id number]   cc:   Jordan Hawks. Elnoria Howard, MD  Fax: [phone]   Gwen Pounds, MD  Fax: [phone]

## 2010-08-19 NOTE — Op Note (Signed)
Brandy Golden, Brandy Golden                           ACCOUNT NO.:  [id number]   MEDICAL RECORD NO.:  [id number]                   PATIENT TYPE:  INP   LOCATION:  2550                                 FACILITY:  [facility]   PHYSICIAN:  Velora Heckler, M.D.                DATE OF BIRTH:  10/19/1945   DATE OF PROCEDURE:  04/08/2002  DATE OF DISCHARGE:                                 OPERATIVE REPORT   PREOPERATIVE DIAGNOSIS:  Substernal thyroid goiter.   POSTOPERATIVE DIAGNOSIS:  Substernal thyroid goiter.   PROCEDURE:  Total thyroidectomy.   SURGEON:  Velora Heckler, M.D.   ASSISTANT:  Karle Plumber, M.D.   ANESTHESIA:  General per Dr. Diamantina Monks.   ESTIMATED BLOOD LOSS:  200 cubic centimeters.   PREPARATION:  Betadine.   COMPLICATIONS:  None.   INDICATIONS:  The patient is a 65 year old white female who presents at the  request of Dr. Ardyth Harps with substernal thyroid goiter.  The patient has  had a goiter for approximately 15 years.  She has developed mild dysphagia.  The patient had undergone a myelogram for neck pain and left arm pain.  Subsequent CT scan demonstrated a large substernal mass in the left neck  extending to the level of the carina.  It was causing significant tracheal  deviation and displacement of the esophagus.  The patient was mildly  hyperthyroid.  Nuclear medicine scan demonstrated findings consistent with  multinodular goiter with large substernal component.  The patient now comes  to surgery for thyroidectomy.   BODY OF REPORT:  The procedure was done in OR#7 at the [facility]. [facility].  The patient is brought to the operating room and placed  in the supine position on the operating room table.  Following  administration of general anesthesia, the patient is positioned and then  prepped and draped in the usual strict aseptic fashion.  After ascertaining  that an adequate level of anesthesia has been obtained, a Kocher incision is  made with  a #10 blade.  Dissection is carried down through the platysma and  hemostasis obtained with the electrocautery.  External jugular veins are  divided between hemostats and ligated with 3-0 silk ties.  Skin flaps are  developed cephalad and caudad from the thyroid notch to the sternal notch.  A Mahorner self-retaining retractor is placed for exposure.  Strap muscles  are incised in the midline and dissection is begun on the left side of the  neck.  The left thyroid lobe is markedly enlarged with a very large  substernal component extending, by CT scan, down to the level of the carina.  It measures approximately 11 cm in greatest dimension.  Gland is moderately  inflammatory.  There are adhesions to the surface of the gland.  The gland  is also quite vascular.  Venous tributaries are divided between small and  medium  Ligaclips as well as larger vessels ligated in continuity with 3-0  silk ties and divided.  The superior pole is mobilized and dissected out.  Superior pole vessels are ligated in continuity with 2-0 silk ligatures and  medium Ligaclips and divided.  Gland is rolled medially.  What appears to be  parathyroid tissue in the left superior position is identified and preserved  on a vascular pedicle.  The gland is further rolled medially.  Recurrent  laryngeal nerve is identified and preserved.  Ligament of Allyson Sabal is  transected, leaving behind a tiny amount of thyroid tissue immediately  adjacent to the recurrent laryngeal nerve.  The inferior pole on the left  side is then carefully dissected out.  Venous tributaries are ligated in  continuity with 3-0 silk ligatures and divided.  Using careful blunt  dissection, the inferior pole of the left thyroid lobe is dissected out of  the posterior mediastinum and delivered up and into the wound.  No  significant bleeding is encountered.  The entire left lobe of the gland is  then rolled anteriorly and vascular tributaries divided between  small  Ligaclips.  Using the electrocautery, the gland is elevated off of the  anterior surface of the trachea.  The trachea and esophagus are markedly  displaced to the right with rotation.  The left recurrent laryngeal nerve is  markedly posterior.   Next, we began dissection on the right side.  Again, strap muscles are  elevated off the anterior surface of the gland and reflected laterally.  Venous tributaries are divided between medium Ligaclips.  The middle thyroid  vein is identified and ligated in continuity with 3-0 silk ties and divided.  An inferior thyroid nodule is quite anterior.  It is carefully dissected  out.  There appears to be a parathyroid gland in the right inferior position  overlying this nodule.  It is carefully dissected out on a vascular pedicle  and preserved.  The inferior venous tributaries are divided between  Ligaclips.  Next, the superior pole is dissected out.  Superior pole vessels  are ligated in continuity with 2-0 silk ties and medium Ligaclips and  divided.  The gland is rolled anteriorly.  Recurrent laryngeal nerve is  again identified and preserved.  It is rotated well anterior due to the  distortion of the trachea and esophagus due to the left-sided goiter.  Vascular tributaries are divided between small and medium Ligaclips.  Gland  is rolled anteriorly.  Again, a small nubbin of thyroid tissue is left  immediately adjacent to the recurrent nerve in order to preserve the nerve.  The remainder of the gland is then excised off the anterior trachea with the  electrocautery.  The entire specimen is excised.  A suture is used to mark  the left superior pole.  The entire gland is then submitted to pathology for  review.  The neck is irrigated with warm saline and hemostasis obtained with  the electrocautery and small Ligaclips.  Surgicel is placed over the area of  the recurrent laryngeal nerves bilaterally.  A small piece of Surgicel is also placed  into the posterior mediastinum at the site of the inferior pole  of the left thyroid lobe.  Strap muscles are reapproximated in the midline  with interrupted 3-0 Vicryl sutures.  Platysma is reapproximated with  interrupted 3-0 Vicryl sutures.  Skin edges are reapproximated with widely  spaced stainless-steel staples and half-inch Steri-Strips and benzoin.  Sterile dressings are applied.  The  patient is awakened from anesthesia and  brought to the recovery room in stable condition.  The patient tolerated the  procedure well.                                               Velora Heckler, M.D.    TMG/MEDQ  D:  04/08/2002  T:  04/08/2002  Job:  [id number]   cc:   Mike Craze Evlyn Kanner, M.D.  [address]  [place]  [place] [address]  Fax: [phone]   Diona Fanti  [address]  [place]  [place] [address]  Fax: [phone]

## 2010-08-19 NOTE — Consult Note (Signed)
NAMELENDORA, Brandy Golden                 ACCOUNT NO.:  [id number]   MEDICAL RECORD NO.:  [id number]          PATIENT TYPE:  INP   LOCATION:  6714                         FACILITY:  [facility]   PHYSICIAN:  Jordan Hawks. Elnoria Howard, MD    DATE OF BIRTH:  Jan 26, 1946   DATE OF CONSULTATION:  10/29/2004  DATE OF DISCHARGE:                                   CONSULTATION   REASON FOR ADMISSION:  Melena and anemia.   HISTORY OF PRESENT ILLNESS:  This is a 65 year old white female with a past  medical history of iron-deficiency anemia, gastroesophageal reflux disease,  irritable bowel, and fibromyalgia who was admitted for a rapid decline in  her hemoglobin as well as complaints of melena.  The patient was initially  seen by her primary care Rayanne Padmanabhan, Dr. Timothy Lasso, approximately two days before  her admission and she was noted to have a stable hemoglobin at that time.  However, on the day of admission, the patient states that in the evening  before she had felt unwell and felt to have nausea and induced vomiting at  that time, and she started to notice having bouts of melena, no evidence of  any hematochezia.  Subsequently, she contacted her primary care Zeah Germano and  she is admitted to the hospital for further evaluation.  Upon arrival, the  patient was in hemodynamically stable condition at that time.  She states  that she has had a prior workup of her iron-deficiency anemia in the past  which started approximately three years ago.  She has had upper and lower  endoscopies which were negative for any active bleeding or any clear  evidence of bleeding.  There was a possibility of an esophageal source;  however, it was uncertain.  No prior history of capsule endoscopies in the  past.  She denies taking any Pepto-Bismol during this complaint of melena,  and she has not been started on any NSAIDS in the interval.  The patient has  required one blood transfusion in the past, however, during other events she  was  provided with supplements which increased her hemoglobin.  The highest  hemoglobin that she reports is approximately 13.  On admission, her  hemoglobin has declined down to the 8 range.   PAST MEDICAL HISTORY:  As stated above.   PAST SURGICAL HISTORY:  As stated above with the addition of TAH and BSO in  1994, foot surgery, and a thyroidectomy on April 08, 2002.   SOCIAL HISTORY:  The patient is married with two children.  No prior tobacco  use, no alcohol use, no illicit drug use.   ALLERGIES:  ERYTHROMYCIN which results in a rash.   MEDICATIONS:  1.  Aciphex 20 mg b.i.d.  2.  Synthroid 12.5 mcg daily.  3.  Xanax 0.5 mg q.p.m.  4.  Zoloft 50 mg daily.  5.  ____________0.25 mg daily.  6.  Multivitamin.  7.  Aspirin 81 mg daily.  8.  Allegra D.  9.  Benicar.   REVIEW OF SYSTEMS:  Negative for any dizziness, blurry vision, headache.  Positive for  nausea and one episode of self-induced vomiting.  Positive for  melena.  No abdominal pain.  No chest pain, no shortness of breath.  No  myalgias, arthralgias, arthritis.  No cough.   PHYSICAL EXAMINATION:  VITAL SIGNS:  Blood pressure is 109/66, heart rate  97, respirations 20, temperature is 98.5, pulse ox is 98% on room air.  GENERAL:  The patient is in no acute distress, alert and oriented.  HEENT:  Normocephalic, atraumatic.  Extraocular movements were intact.  Pupils equal, round, reactive to light.  NECK:  Supple, no lymphadenopathy.  LUNGS:  Clear to auscultation bilaterally.  CARDIOVASCULAR:  Regular rate and rhythm without murmurs, rubs, or gallops.  ABDOMEN:  Obese, soft, nontender, nondistended, positive bowel sounds.  EXTREMITIES:  No cyanosis, clubbing, or edema.  RECTAL:  Positive for melena which is confirmed with Hemoccult.  No palpable  masses.  No evidence of any hematochezia.   LABORATORY DATA:  White blood cell count is 5.6, hemoglobin 8.4, which is a  decline from 9.9 on the evening before, platelets at 227.   Sodium 136,  potassium 4.2, chloride 103, CO2 of 26, BUN is 34, creatinine is 0.6,  glucose is 125.  AST 18, ALT 17, alkaline phosphatase 69.  Total bilirubin  0.7, albumin 3.3.  PT is 13.2, INR 1, PTT is 27.   IMPRESSION:  1.  Melena.  2.  Iron-deficiency anemia.  3.  Gastroesophageal reflux disease.   The patient has a three year history of iron-deficiency anemia and prior  endoscopies with negative workup.  At this time, she is having active  bleeding as noted by the decline of her hemoglobin from a baseline of  between 11 to 13, down to its lowest at this time at 8.4.  She is also  confirmed to be heme-positive.  It is likely that the source of bleeding  will be able to detected during the upper endoscopy;  however, if none is  found, a capsule endoscopy will be recommended.  She has not had any prior  evaluation of her small bowel in the past.   PLAN:  1.  Esophagogastroduodenoscopy/enteroscopy.  2.  If the examination is negative, a capsule endoscopy will be pursued.  3.  Continue Protonix.  4.  Transfuse as necessary.  5.  Serial CBC's.   The risks of the procedure were discussed with the patient, which include,  but is not exclusive of bleeding, infection, perforation, medication  reaction, and the risk of death.  The patient acknowledges the risks and  wishes to proceed.       PDH/MEDQ  D:  10/29/2004  T:  10/29/2004  Job:  [id number]   cc:   Gwen Pounds, MD  Fax: [phone]   Lina Sar, M.D. [facility]  [address]  [place]  [place] [address]

## 2010-08-19 NOTE — Discharge Summary (Signed)
Brandy Golden, PANCHAL                 ACCOUNT NO.:  [id number]   MEDICAL RECORD NO.:  [id number]          PATIENT TYPE:  INP   LOCATION:                               FACILITY:  [facility]   PHYSICIAN:  Gwen Pounds, MD       DATE OF BIRTH:  Jul 14, 1945   DATE OF ADMISSION:  10/28/2004  DATE OF DISCHARGE:  10/31/2004                                 DISCHARGE SUMMARY   PRIMARY CARE PHYSICIAN:  Dr. Creola Corn   GASTROENTEROLOGIST:  Dr. Lina Sar   DISCHARGE DIAGNOSES:  1.  Upper gastrointestinal bleed secondary to gastric linear ulcerations.  2.  Anemia requiring a transfusion and Procrit.  3.  Hypertension.  4.  Irritable bowel syndrome.  5.  Gastroesophageal reflux disease.  6.  Hypothyroidism after goiter resection.  7.  History of iron deficiency anemia.  8.  Fecal incontinence.  9.  Fibrocystic breast disease.  10. Basal cell carcinoma of the skin.  11. Allergic rhinitis.  12. Anxiety.   DISCHARGE MEDICATIONS:  1.  Benicar/HCT 20/12.5 mg p.o. daily.  2.  Zoloft 50 mg daily.  3.  Xanax 0.5 mg as needed.  4.  Allegra 180 daily.  5.  Elavil 50 mg q.h.s.  6.  Synthroid 125 mcg daily.  7.  Aspirin 81 mg on hold for the next two to three weeks.  8.  Calcium plus vitamin D twice a day.  9.  Multivitamin.  10. NuLev 0.125 mg p.o. b.i.d. p.r.n.  11. Aciphex or Nexium as directed.   DISCHARGE PROCEDURES:  Upper endoscopy showing a normal esophagus, but  multiple linear ulcerations and fresh blood noted in the stomach to account  for her upper gastrointestinal bleed.   HISTORY OF PRESENT ILLNESS:  Briefly, this is a 65 year old female with  history of iron deficiency anemia and irritable bowel syndrome who normally  had her GI work-ups per [facility] gastroenterologist and included multiple  endoscopies and colonoscopies.  They had found esophagitis and GERD in the  past.  She was seen in my office by me a couple days prior to admission with  her stomach bothering her but her  hemoglobin was 12.5.  Over the next two  days she had more cramping, increased nausea, and ended up vomiting with  coffee-ground emesis.  She presented to my office and saw Dr. Wylene Simmer.  She  also reported that she had melanotic stools.  Hemoglobin was obtained and  was 10.9 which is down 1.6 g from two days prior.  Dr. Wylene Simmer reported that  she appeared diaphoretic with tachycardia and did not look well.  She was  being admitted for an upper GI bleed and to follow carefully.   HOSPITAL COURSE:  She was admitted on October 28, 2004 for an upper GI bleed.  Serial CBCs were obtained and gastroenterology was consulted.  The following  day her hemoglobin had trended down to 8.4 and she was given a transfusion.  She was kept on proton-pump inhibition and she was set up for an upper  endoscopy.  The upper endoscopy showed gastric  linear ulcerations/erosions  and she was kept on somewhat of a bowel rest.  On October 31, 2004 she already  had received her 1 unit of packed red blood cells for hemoglobin of 8.1 and  her hemoglobin improved to 8.9 and stabilized.  The last time she used  Phenergan was 24 hours prior and she had not had melena, bright red blood,  hematochezia, nausea, or vomiting since admission.  It was determined that  for her upper GI bleed she can continue twice a day proton-pump inhibition,  that a gastrin level may be appropriate to check in her but she would need  to be off proton-pump inhibition for one week to do this.  Decided to check  one more hemoglobin later in the day and give her Procrit.  If her  hemoglobin remained stable she potentially could be discharged home safely.  Her hemoglobin later on that afternoon was 9.8 and this was felt to be  stable enough for discharge.  Her blood pressure remained stable throughout  her hospital stay.  She was told to stay off her aspirin and she was put on  a diet that she tolerated well.  She was scheduled to see Dr. Juanda Chance in the   office on August 30 at 9:45 a.m. to follow up for a possible EGD or maybe  capsule endoscopy at that time.   AFTER-CARE FOLLOW-UP INSTRUCTIONS:  Again, follow up with Dr. Juanda Chance as  stated above.  She will follow up with me as we schedule it.  We will keep  track of her hemoglobins very carefully.      Gwen Pounds, MD  Electronically Signed     JMR/MEDQ  D:  12/02/2004  T:  12/02/2004  Job:  [id number]

## 2010-08-19 NOTE — Op Note (Signed)
Brandy Golden, Brandy Golden                 ACCOUNT NO.:  [id number]   MEDICAL RECORD NO.:  [id number]          PATIENT TYPE:  INP   LOCATION:  6714                         FACILITY:  [facility]   PHYSICIAN:  Jordan Hawks. Elnoria Howard, MD    DATE OF BIRTH:  November 02, 1945   DATE OF PROCEDURE:  04/02/2005  DATE OF DISCHARGE:                                 OPERATIVE REPORT   PROCEDURE PERFORMED:  Esophagogastroduodenoscopy.   REFERRING PHYSICIAN:  Gwen Pounds, MD.   INDICATION FOR EGD:  Melena and anemia.   PHYSICAL EXAMINATION:  VITAL SIGNS:  Stable.  GENERAL:  The patient is in no acute distress, alert and oriented.  HEENT:  Normocephalic, atraumatic.  Extraocular muscles intact.  Pupils are  equal and nonreactive to light.  NECK:  Supple with no lymphadenopathy.  LUNGS:  Clear to auscultation bilaterally.  CARDIOVASCULAR:  Regular rate and rhythm.  ABDOMEN:  Soft, nontender, nondistended.  EXTREMITIES:  No clubbing, cyanosis, or edema.   CONSENT:  Informed consent was obtained from the patient, describing the  risks of bleeding, infection, perforation, medication reactions, and the  risk of death, all of which are not exclusive of any other complications  that may occur.   MEDICATIONS:  1.  Fentanyl 75 mcg IV.  2.  Versed 7.5 mg IV.   PROCEDURE:  After adequate sedation was achieved, the pediatric colonoscope  was introduced from the oral cavity and advanced under direct visualization  into the esophagus.  The pediatric colonoscope was used in anticipation of a  possible small bowel bleeding site as she has had prior EGDs with negative  findings for blood loss.  The colonoscope was then advanced under direct  visualization, and the GE junction was noted and a photo documentation was  obtained.  No abnormalities were discerned in this region.  Upon entry into  the stomach, there is no evidence of any coffee-grounds; however, there was  some evidence of fresh blood and, upon further examination of  the gastric  mucosa, there appeared to be multiple, linear, gastric ulcerations.  There  is no clear evidence of any bleeding AVMs, Dieulafoy lesions, or masses.  The colonoscope was then advanced into the proximal small bowel, which was  negative for any evidence of old or fresh blood.  The mucosa appeared to be  normal and no evidence of any ulcerations, vascular abnormalities, or  masses.  The colonoscope was then withdrawn and the ulcerations were  evaluated closely.  No evidence of active bleeding at this time.  There was  the thought of possibly using the APC in one area for a possible AVM,  however, this was not pursued after finding multiple other potential sites.  It was felt that her disease is most likely from a peptic ulcer source.  The  stomach appeared to be mildly deformed, although anatomically the stomach  appeared to be normal, but there was some difficulty of maneuvering within  the stomach.  The air was aspirated out of the stomach and the colonoscope  was then withdrawn from the patient without difficulty.  No complications  were encountered.   PLAN:  1.  Change the patient to Nexium 40 mg p.o. b.i.d.  2.  Follow the H&H.  3.  Given the unusual locations of the ulcerations, the patient may benefit      from a screening check of gastrin for the possibility of Zollinger-      Ellison syndrome.  4.  It would probably be prudent to repeat an EGD in the near future to      reevaluate the gastric mucosa.       PDH/MEDQ  D:  10/29/2004  T:  10/29/2004  Job:  [id number]   cc:   Gwen Pounds, MD  Fax: [phone]   Lina Sar, M.D. [facility]  [address]  [place]  [place] [address]

## 2010-11-15 ENCOUNTER — Ambulatory Visit: Payer: Self-pay | Admitting: Internal Medicine

## 2011-01-20 ENCOUNTER — Other Ambulatory Visit: Payer: Self-pay | Admitting: Internal Medicine

## 2011-01-20 DIAGNOSIS — E049 Nontoxic goiter, unspecified: Secondary | ICD-10-CM

## 2011-02-10 ENCOUNTER — Ambulatory Visit
Admission: RE | Admit: 2011-02-10 | Discharge: 2011-02-10 | Disposition: A | Discharge status: Home / Self Care | Payer: Medicare Other | Source: Ambulatory Visit | Attending: Internal Medicine | Admitting: Internal Medicine

## 2011-02-10 DIAGNOSIS — E049 Nontoxic goiter, unspecified: Secondary | ICD-10-CM

## 2011-02-10 MED ORDER — IOHEXOL 300 MG/ML  SOLN
75.0000 mL | Freq: Once | INTRAMUSCULAR | Status: AC | PRN
  Administered 2011-02-10: 75 mL via INTRAVENOUS

## 2011-12-27 ENCOUNTER — Ambulatory Visit: Payer: Self-pay | Admitting: Internal Medicine

## 2012-01-31 ENCOUNTER — Other Ambulatory Visit: Payer: Self-pay | Admitting: Internal Medicine

## 2012-01-31 DIAGNOSIS — E049 Nontoxic goiter, unspecified: Secondary | ICD-10-CM

## 2012-02-02 ENCOUNTER — Ambulatory Visit
Admission: RE | Admit: 2012-02-02 | Discharge: 2012-02-02 | Disposition: A | Discharge status: Home / Self Care | Payer: Medicare Other | Source: Ambulatory Visit | Attending: Internal Medicine | Admitting: Internal Medicine

## 2012-02-02 DIAGNOSIS — E049 Nontoxic goiter, unspecified: Secondary | ICD-10-CM

## 2012-02-02 MED ORDER — IOHEXOL 300 MG/ML  SOLN
75.0000 mL | Freq: Once | INTRAMUSCULAR | Status: AC | PRN
  Administered 2012-02-02: 75 mL via INTRAVENOUS

## 2013-01-03 ENCOUNTER — Ambulatory Visit: Payer: Self-pay | Admitting: Internal Medicine

## 2013-06-04 ENCOUNTER — Telehealth: Payer: Self-pay | Admitting: Internal Medicine

## 2013-06-04 NOTE — Telephone Encounter (Signed)
Spoke with patient's husband and she wishes to switch from Dr. Olevia Perches to Dr. Hilarie Fredrickson. They are personal friends with Dr. Hilarie Fredrickson. Is this ok Dr. Olevia Perches?

## 2013-06-04 NOTE — Telephone Encounter (Signed)
OK  DB

## 2013-06-04 NOTE — Telephone Encounter (Signed)
Dr. Hilarie Fredrickson,  Patient's husband called asking to switch from Dr. Olevia Perches to you. States they are personal friends. [place] with Dr. Olevia Perches. Is it ok with you? Also, asking for appointment soon... ?with extender or do you want her worked in with you. Please, advise.

## 2013-06-05 NOTE — Telephone Encounter (Signed)
Okay for switch to my clinic. Please inquire as to her reason for follow-up appointment, current problems/symptoms. If pt feels non-urgent then she can be scheduled with my next available, otherwise with Amy while I am supervising (assuming she is okay seeing an APP)

## 2013-06-06 NOTE — Telephone Encounter (Signed)
Spoke with patient and scheduled OV with Nicoletta Ba, PA on 06/12/13 at 9:00 AM when Dr. Hilarie Fredrickson is supervising. Patient reports a long history of reflux even after Nissen Fundoplication. She is having more problems recently. She takes Omeprazole and Tums. Reports problems with "food sticking sometimes". States this has been occurring for 3 years. She reports a decreased appetite.

## 2013-06-11 ENCOUNTER — Encounter: Payer: Self-pay | Admitting: *Deleted

## 2013-06-12 ENCOUNTER — Ambulatory Visit (INDEPENDENT_AMBULATORY_CARE_PROVIDER_SITE_OTHER): Payer: Medicare Other | Admitting: Physician Assistant

## 2013-06-12 ENCOUNTER — Encounter: Payer: Self-pay | Admitting: Physician Assistant

## 2013-06-12 VITALS — BP 114/78 | HR 88 | Ht 69.75 in | Wt 211.5 lb

## 2013-06-12 DIAGNOSIS — K219 Gastro-esophageal reflux disease without esophagitis: Secondary | ICD-10-CM

## 2013-06-12 DIAGNOSIS — R110 Nausea: Secondary | ICD-10-CM

## 2013-06-12 DIAGNOSIS — Z9889 Other specified postprocedural states: Secondary | ICD-10-CM

## 2013-06-12 MED ORDER — DEXLANSOPRAZOLE 60 MG PO CPDR: 60.0000 mg | DELAYED_RELEASE_CAPSULE | Freq: Every day | ORAL | Status: DC

## 2013-06-12 NOTE — Patient Instructions (Signed)
You have been scheduled for an endoscopy with propofol. Please follow written instructions given to you at your visit today. If you use inhalers (even only as needed), please bring them with you on the day of your procedure.  We have given you samples of Dexilant 60 mg.Take 1 tab daily in the morning.     We have also given you samples of Phazyme for gas and bloating.  We have scheduled the Upper Gi test and the Barium Swallow test at [facility] radiology. Go to patient registration inside front door of hospital. Date is 06-17-2013 at 9:30 am .  Arrive at 9:15 am. Have nothing to eat or drink after midnight. If you need to take medications, take them with small sips of water.
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
## 2013-06-13 ENCOUNTER — Other Ambulatory Visit: Payer: Self-pay | Admitting: *Deleted

## 2013-06-13 ENCOUNTER — Telehealth: Payer: Self-pay | Admitting: *Deleted

## 2013-06-13 DIAGNOSIS — K219 Gastro-esophageal reflux disease without esophagitis: Secondary | ICD-10-CM

## 2013-06-13 NOTE — Telephone Encounter (Signed)
Message copied by Hulan Saas on Fri Jun 13, 2013 12:02 PM ------      Message from: [place], [place]      Created: Fri Jun 13, 2013  9:53 AM       Please schedule Brandy Golden for a CT of chest with contrast.. Last one was in 2013- tell her we want to look at the thyroid tissue to see if larger as this may be causing her problems.Marland Kitchenget scheduled for next week -thanks ------

## 2013-06-13 NOTE — Telephone Encounter (Signed)
Spoke with patient and gave her appointment and instructions. (Patient did not have CT but CXR 6 weeks ago)

## 2013-06-13 NOTE — Telephone Encounter (Signed)
Scheduledl CT chest with contrast at [place] CT(rose) 06/19/13 at 1:00 PM.NPO 2 hours prior. Needs BUN, Creatinine prior to CT. Called patient and she states she had a CT at [facility] 6 weeks ago. She states she brought the disk and Nicoletta Ba, PA has it. Please, advise

## 2013-06-14 ENCOUNTER — Encounter: Payer: Self-pay | Admitting: Physician Assistant

## 2013-06-17 ENCOUNTER — Ambulatory Visit (HOSPITAL_COMMUNITY)
Admission: RE | Admit: 2013-06-17 | Discharge: 2013-06-17 | Disposition: A | Discharge status: Home / Self Care | Payer: Medicare Other | Source: Ambulatory Visit | Attending: Physician Assistant | Admitting: Physician Assistant

## 2013-06-17 ENCOUNTER — Other Ambulatory Visit (INDEPENDENT_AMBULATORY_CARE_PROVIDER_SITE_OTHER): Payer: Medicare Other

## 2013-06-17 DIAGNOSIS — Z9889 Other specified postprocedural states: Secondary | ICD-10-CM

## 2013-06-17 DIAGNOSIS — K219 Gastro-esophageal reflux disease without esophagitis: Secondary | ICD-10-CM

## 2013-06-17 DIAGNOSIS — K449 Diaphragmatic hernia without obstruction or gangrene: Secondary | ICD-10-CM | POA: Insufficient documentation

## 2013-06-17 DIAGNOSIS — R110 Nausea: Secondary | ICD-10-CM

## 2013-06-17 LAB — CREATININE, SERUM: Creatinine, Ser: 1 mg/dL (ref 0.4–1.2)

## 2013-06-17 LAB — BUN: BUN: 22 mg/dL (ref 6–23)

## 2013-06-19 ENCOUNTER — Ambulatory Visit (INDEPENDENT_AMBULATORY_CARE_PROVIDER_SITE_OTHER)
Admission: RE | Admit: 2013-06-19 | Discharge: 2013-06-19 | Disposition: A | Discharge status: Home / Self Care | Payer: Medicare Other | Source: Ambulatory Visit | Attending: Physician Assistant | Admitting: Physician Assistant

## 2013-06-19 DIAGNOSIS — K219 Gastro-esophageal reflux disease without esophagitis: Secondary | ICD-10-CM

## 2013-06-19 DIAGNOSIS — R222 Localized swelling, mass and lump, trunk: Secondary | ICD-10-CM

## 2013-06-19 MED ORDER — IOHEXOL 300 MG/ML  SOLN
100.0000 mL | Freq: Once | INTRAMUSCULAR | Status: AC | PRN
  Administered 2013-06-19: 100 mL via INTRAVENOUS

## 2013-06-24 ENCOUNTER — Encounter: Payer: Medicare Other | Admitting: Internal Medicine

## 2013-06-30 ENCOUNTER — Ambulatory Visit (AMBULATORY_SURGERY_CENTER): Payer: Medicare Other | Admitting: Internal Medicine

## 2013-06-30 ENCOUNTER — Encounter: Payer: Self-pay | Admitting: Internal Medicine

## 2013-06-30 VITALS — BP 161/83 | HR 72 | Temp 97.9°F | Resp 15 | Ht 69.75 in | Wt 211.0 lb

## 2013-06-30 DIAGNOSIS — K259 Gastric ulcer, unspecified as acute or chronic, without hemorrhage or perforation: Secondary | ICD-10-CM

## 2013-06-30 DIAGNOSIS — R1319 Other dysphagia: Secondary | ICD-10-CM

## 2013-06-30 DIAGNOSIS — K2970 Gastritis, unspecified, without bleeding: Secondary | ICD-10-CM

## 2013-06-30 DIAGNOSIS — R110 Nausea: Secondary | ICD-10-CM

## 2013-06-30 DIAGNOSIS — K2960 Other gastritis without bleeding: Secondary | ICD-10-CM

## 2013-06-30 DIAGNOSIS — K929 Disease of digestive system, unspecified: Secondary | ICD-10-CM

## 2013-06-30 DIAGNOSIS — K219 Gastro-esophageal reflux disease without esophagitis: Secondary | ICD-10-CM

## 2013-06-30 DIAGNOSIS — K2990 Gastroduodenitis, unspecified, without bleeding: Secondary | ICD-10-CM

## 2013-06-30 DIAGNOSIS — K9189 Other postprocedural complications and disorders of digestive system: Secondary | ICD-10-CM

## 2013-06-30 MED ORDER — SODIUM CHLORIDE 0.9 % IV SOLN: 500.0000 mL | INTRAVENOUS | Status: DC

## 2013-06-30 NOTE — Patient Instructions (Addendum)

## 2013-06-30 NOTE — Progress Notes (Signed)
Called to room to assist during endoscopic procedure.  Patient ID and intended procedure confirmed with present staff. Received instructions for my participation in the procedure from the performing physician.  

## 2013-06-30 NOTE — Op Note (Signed)
[facility]  [address] [place], [id number]   ENDOSCOPY PROCEDURE REPORT  PATIENT: [name], [name]  MR#: [id number] BIRTHDATE: 1946/02/23 , 9  yrs. old GENDER: Female ENDOSCOPIST: Jerene Bears, MD PROCEDURE DATE:  06/30/2013 PROCEDURE:  EGD w/ biopsy ASA CLASS:     Class III INDICATIONS:  Heartburn.   history of GERD.   Dysphagia.  Hx of Nissen fundoplication MEDICATIONS: MAC sedation, administered by CRNA and Propofol (Diprivan) 170 mg IV TOPICAL ANESTHETIC: none  DESCRIPTION OF PROCEDURE: After the risks benefits and alternatives of the procedure were thoroughly explained, informed consent was obtained.  The LB [id number] endoscope was introduced through the mouth and advanced to the second portion of the duodenum. Without limitations.  The instrument was slowly withdrawn as the mucosa was fully examined.   ESOPHAGUS: The mucosa of the esophagus appeared normal.   The GE junction is at 34 cm from the incisors and then there is a recurrent, 5 cm hiatal hernia with the Nissen fundoplication wrap at 39 cm from the incisors.  There is retained food residue in the recurrent hernia.  There are small Cameron's erosions at the level of the fundoplication.  STOMACH: Moderate acute gastritis (inflammation) was found in the gastric antrum.  There were erosions present and superficial ulceration.  Biopsies were taken in the gastric body, antrum and angularis.  DUODENUM: The duodenal mucosa showed no abnormalities in the bulb and second portion of the duodenum.  Retroflexed views revealed evidence of prior fundoplication and recurrent hiatal hernia as described above.  The scope was then withdrawn from the patient and the procedure completed.  COMPLICATIONS: There were no complications.  ENDOSCOPIC IMPRESSION: 1.   The mucosa of the esophagus appeared normal 2.   Recurrent hiatal hernia 5 cm in size, with retained good. Cameron's erosions present.   Slipped Nissen fundoplication 3.   Acute gastritis (inflammation) was found in the gastric antrum; biopsies were taken in the antrum and angularis 4.   The duodenal mucosa showed no abnormalities in the bulb and second portion of the duodenum      RECOMMENDATIONS: 1.  Anti-reflux regimen to be followed 2.  Await biopsy results 3.  Continue Dexilant 60 mg daily 4.  Office follow-up with me  eSigned:  Jerene Bears, MD 06/30/2013 10:58 AM   CC:The Patient, Shon Baton, MD, and Gideon, Amy PA-C  PATIENT NAME:  Brandy Golden, Brandy Golden MR#: [id number]

## 2013-06-30 NOTE — Progress Notes (Signed)
Procedure ends, to recovery, report given and VSS. 

## 2013-07-01 ENCOUNTER — Telehealth: Payer: Self-pay

## 2013-07-01 NOTE — Telephone Encounter (Signed)
  Follow up Call-  Call back number 06/30/2013  Post procedure Call Back phone  # [phone] Tom's cell (husband)  Permission to leave phone message Yes     Patient questions:  Do you have a fever, pain , or abdominal swelling? no Pain Score  0 *  Have you tolerated food without any problems? yes  Have you been able to return to your normal activities? yes  Do you have any questions about your discharge instructions: Diet   no Medications  no Follow up visit  no  Do you have questions or concerns about your Care? no  Actions: * If pain score is 4 or above: No action needed, pain <4.  Pt the pt we all gave excellent service to her.  No problems per the pt. Maw

## 2013-07-07 ENCOUNTER — Telehealth: Payer: Self-pay | Admitting: Internal Medicine

## 2013-07-07 NOTE — Telephone Encounter (Signed)
Called patient with path results I left her a voicemail and plan to talk to her later in the week regarding surgical referral for repair of slipped Nissen wrap

## 2013-07-11 ENCOUNTER — Telehealth: Payer: Self-pay | Admitting: Internal Medicine

## 2013-07-11 ENCOUNTER — Encounter: Payer: Self-pay | Admitting: Internal Medicine

## 2013-07-11 DIAGNOSIS — K219 Gastro-esophageal reflux disease without esophagitis: Secondary | ICD-10-CM

## 2013-07-11 DIAGNOSIS — R110 Nausea: Secondary | ICD-10-CM

## 2013-07-11 DIAGNOSIS — Z9889 Other specified postprocedural states: Secondary | ICD-10-CM

## 2013-07-11 MED ORDER — DEXLANSOPRAZOLE 60 MG PO CPDR: 60.0000 mg | DELAYED_RELEASE_CAPSULE | Freq: Every day | ORAL | Status: DC

## 2013-07-11 NOTE — Telephone Encounter (Signed)
I called and spoke to Mrs. Creasey about her pathology report and also surgical referral for repair of slipped Nissen fundoplication She says that Dexilant has helped her reflux and swallowing significantly and may be to a point where she can live without discomfort and constant symptoms We talked about referral to Dr. Sharen Counter or Dr. Gerilyn Nestle at [facility] to consider re-do Nissen, but at this time she would like to continue Dexilant 60 mg daily and think more about the possibility of recurrent surgery She will contact me as necessary Continue Dexilant 60 mg once daily

## 2013-07-13 ENCOUNTER — Encounter: Payer: Self-pay | Admitting: Internal Medicine

## 2013-07-18 ENCOUNTER — Encounter: Payer: Self-pay | Admitting: Physician Assistant

## 2013-07-21 ENCOUNTER — Encounter: Payer: Self-pay | Admitting: Internal Medicine

## 2013-07-21 NOTE — Telephone Encounter (Signed)
I spoke to pt by phone regarding surgery referral to [facility]. Dr. Sharen Counter with gen surgery would like to to be seen by thoracic surgery given her history With that in mind, I would recommend, Dr. Wynelle Cleveland or Dr. Elenor Quinones. Please facilitate referral if necessary.  Thanks.

## 2013-07-25 ENCOUNTER — Encounter: Payer: Self-pay | Admitting: Internal Medicine

## 2013-08-01 HISTORY — PX: OTHER SURGICAL HISTORY: SHX169

## 2013-08-11 ENCOUNTER — Encounter: Payer: Self-pay | Admitting: Internal Medicine

## 2013-08-11 ENCOUNTER — Encounter: Payer: Self-pay | Admitting: Physician Assistant

## 2013-08-15 ENCOUNTER — Encounter: Payer: Self-pay | Admitting: Physician Assistant

## 2013-08-15 ENCOUNTER — Encounter: Payer: Self-pay | Admitting: Internal Medicine

## 2013-08-18 ENCOUNTER — Encounter: Payer: Self-pay | Admitting: Physician Assistant

## 2013-08-19 ENCOUNTER — Telehealth: Payer: Self-pay | Admitting: Internal Medicine

## 2013-08-19 NOTE — Telephone Encounter (Signed)
See telephone note.

## 2013-08-20 MED ORDER — HYOSCYAMINE SULFATE 0.125 MG SL SUBL: 0.1250 mg | SUBLINGUAL_TABLET | Freq: Four times a day (QID) | SUBLINGUAL | Status: DC | PRN

## 2013-08-20 NOTE — Telephone Encounter (Signed)
I called Mrs. Brandy Golden to discuss recent e-mails that she had sent via the medical record. I failed to receive all but one of these e-mails but read them after opening her chart She has been seen by Dr. Erie Noe at [facility] to discuss redo Nissen and repair of recurrent hiatal hernia. 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 This mass is displacing the esophagus and trachea. It was Dr. Deboraha Sprang opinion to remove the mediastinal mass first before considering hiatal hernia repair. He reports these cannot be performed at the same time If symptoms persist after a mediastinal mass and she does well, they could likely then proceed to Nissen repair She wanted to discuss this with me and keep me in the loop She is interested in proceeding with these procedures She has also had more of her typical irritable bowel loose stools which she says is very much stress-related. Her stools can be urgent at times she can have fecal seepage. Reviewed her last colonoscopy which was performed by Dr. Olevia Perches and she is up-to-date from a screening standpoint I recommended scheduled Levsin with lunch and dinner, 0.125 mg to 0.25 mg sublingual as needed Time was provided for questions and answers. She thanked me for the call.

## 2014-01-29 ENCOUNTER — Ambulatory Visit: Payer: Self-pay | Admitting: Internal Medicine

## 2014-04-01 ENCOUNTER — Encounter: Payer: Self-pay | Admitting: Internal Medicine

## 2015-02-15 ENCOUNTER — Other Ambulatory Visit: Payer: Self-pay | Admitting: Internal Medicine

## 2015-03-02 ENCOUNTER — Other Ambulatory Visit: Payer: Self-pay | Admitting: Internal Medicine

## 2015-03-02 DIAGNOSIS — Z1231 Encounter for screening mammogram for malignant neoplasm of breast: Secondary | ICD-10-CM

## 2015-03-09 ENCOUNTER — Other Ambulatory Visit: Payer: Self-pay | Admitting: Internal Medicine

## 2015-03-09 ENCOUNTER — Ambulatory Visit
Admission: RE | Admit: 2015-03-09 | Discharge: 2015-03-09 | Disposition: A | Discharge status: Home / Self Care | Payer: Medicare Other | Source: Ambulatory Visit | Attending: Internal Medicine | Admitting: Internal Medicine

## 2015-03-09 DIAGNOSIS — Z1231 Encounter for screening mammogram for malignant neoplasm of breast: Secondary | ICD-10-CM | POA: Diagnosis present

## 2016-03-15 ENCOUNTER — Other Ambulatory Visit (HOSPITAL_COMMUNITY): Payer: Self-pay | Admitting: Internal Medicine

## 2016-03-15 ENCOUNTER — Ambulatory Visit (HOSPITAL_COMMUNITY)
Admission: RE | Admit: 2016-03-15 | Discharge: 2016-03-15 | Disposition: A | Discharge status: Home / Self Care | Payer: Medicare Other | Source: Ambulatory Visit | Attending: Vascular Surgery | Admitting: Vascular Surgery

## 2016-03-15 ENCOUNTER — Ambulatory Visit (INDEPENDENT_AMBULATORY_CARE_PROVIDER_SITE_OTHER)
Admission: RE | Admit: 2016-03-15 | Discharge: 2016-03-15 | Disposition: A | Discharge status: Home / Self Care | Payer: Medicare Other | Source: Ambulatory Visit | Attending: Vascular Surgery | Admitting: Vascular Surgery

## 2016-03-15 DIAGNOSIS — I739 Peripheral vascular disease, unspecified: Secondary | ICD-10-CM

## 2016-03-15 DIAGNOSIS — I10 Essential (primary) hypertension: Secondary | ICD-10-CM | POA: Insufficient documentation

## 2016-03-15 DIAGNOSIS — R200 Anesthesia of skin: Secondary | ICD-10-CM | POA: Insufficient documentation

## 2016-03-23 ENCOUNTER — Other Ambulatory Visit: Payer: Self-pay | Admitting: Internal Medicine

## 2016-03-23 DIAGNOSIS — Z1231 Encounter for screening mammogram for malignant neoplasm of breast: Secondary | ICD-10-CM

## 2016-04-24 ENCOUNTER — Ambulatory Visit
Admission: RE | Admit: 2016-04-24 | Discharge: 2016-04-24 | Disposition: A | Discharge status: Home / Self Care | Payer: Medicare Other | Source: Ambulatory Visit | Attending: Internal Medicine | Admitting: Internal Medicine

## 2016-04-24 DIAGNOSIS — Z1231 Encounter for screening mammogram for malignant neoplasm of breast: Secondary | ICD-10-CM

## 2016-05-18 ENCOUNTER — Encounter: Payer: Self-pay | Admitting: Internal Medicine

## 2016-07-13 HISTORY — PX: OTHER SURGICAL HISTORY: SHX169

## 2017-04-23 ENCOUNTER — Other Ambulatory Visit: Payer: Self-pay | Admitting: Internal Medicine

## 2017-04-23 DIAGNOSIS — Z1231 Encounter for screening mammogram for malignant neoplasm of breast: Secondary | ICD-10-CM

## 2017-04-27 ENCOUNTER — Ambulatory Visit
Admission: RE | Admit: 2017-04-27 | Discharge: 2017-04-27 | Disposition: A | Discharge status: Home / Self Care | Payer: Medicare Other | Source: Ambulatory Visit | Attending: Internal Medicine | Admitting: Internal Medicine

## 2017-04-27 DIAGNOSIS — Z1231 Encounter for screening mammogram for malignant neoplasm of breast: Secondary | ICD-10-CM | POA: Insufficient documentation

## 2017-04-27 DIAGNOSIS — N6489 Other specified disorders of breast: Secondary | ICD-10-CM | POA: Insufficient documentation

## 2017-04-27 DIAGNOSIS — R928 Other abnormal and inconclusive findings on diagnostic imaging of breast: Secondary | ICD-10-CM | POA: Diagnosis not present

## 2017-05-01 ENCOUNTER — Ambulatory Visit
Admission: RE | Admit: 2017-05-01 | Discharge: 2017-05-01 | Disposition: A | Discharge status: Home / Self Care | Payer: Medicare Other | Source: Ambulatory Visit | Attending: Internal Medicine | Admitting: Internal Medicine

## 2017-05-01 ENCOUNTER — Other Ambulatory Visit: Payer: Self-pay | Admitting: Internal Medicine

## 2017-05-01 DIAGNOSIS — N6489 Other specified disorders of breast: Secondary | ICD-10-CM

## 2017-05-01 DIAGNOSIS — R928 Other abnormal and inconclusive findings on diagnostic imaging of breast: Secondary | ICD-10-CM

## 2017-09-05 ENCOUNTER — Other Ambulatory Visit: Payer: Self-pay | Admitting: Internal Medicine

## 2017-09-05 ENCOUNTER — Ambulatory Visit
Admission: RE | Admit: 2017-09-05 | Discharge: 2017-09-05 | Disposition: A | Discharge status: Home / Self Care | Payer: Medicare Other | Source: Ambulatory Visit | Attending: Internal Medicine | Admitting: Internal Medicine

## 2017-09-05 DIAGNOSIS — R112 Nausea with vomiting, unspecified: Secondary | ICD-10-CM

## 2017-09-07 ENCOUNTER — Other Ambulatory Visit: Payer: Self-pay | Admitting: Internal Medicine

## 2017-09-07 DIAGNOSIS — R112 Nausea with vomiting, unspecified: Secondary | ICD-10-CM

## 2017-11-28 ENCOUNTER — Encounter: Payer: Self-pay | Admitting: *Deleted

## 2017-12-04 ENCOUNTER — Encounter: Payer: Self-pay | Admitting: Ophthalmology

## 2017-12-04 ENCOUNTER — Ambulatory Visit
Admission: RE | Admit: 2017-12-04 | Discharge: 2017-12-04 | Disposition: A | Discharge status: Home / Self Care | Payer: Medicare Other | Source: Ambulatory Visit | Attending: Ophthalmology | Admitting: Ophthalmology

## 2017-12-04 ENCOUNTER — Encounter
Admission: RE | Disposition: A | Discharge status: Home / Self Care | Payer: Self-pay | Source: Ambulatory Visit | Attending: Ophthalmology

## 2017-12-04 ENCOUNTER — Ambulatory Visit: Payer: Medicare Other | Admitting: Certified Registered"

## 2017-12-04 DIAGNOSIS — D509 Iron deficiency anemia, unspecified: Secondary | ICD-10-CM | POA: Diagnosis not present

## 2017-12-04 DIAGNOSIS — M797 Fibromyalgia: Secondary | ICD-10-CM | POA: Diagnosis not present

## 2017-12-04 DIAGNOSIS — M8580 Other specified disorders of bone density and structure, unspecified site: Secondary | ICD-10-CM | POA: Diagnosis not present

## 2017-12-04 DIAGNOSIS — F329 Major depressive disorder, single episode, unspecified: Secondary | ICD-10-CM | POA: Insufficient documentation

## 2017-12-04 DIAGNOSIS — K589 Irritable bowel syndrome without diarrhea: Secondary | ICD-10-CM | POA: Insufficient documentation

## 2017-12-04 DIAGNOSIS — K219 Gastro-esophageal reflux disease without esophagitis: Secondary | ICD-10-CM | POA: Diagnosis not present

## 2017-12-04 DIAGNOSIS — I10 Essential (primary) hypertension: Secondary | ICD-10-CM | POA: Insufficient documentation

## 2017-12-04 DIAGNOSIS — H2512 Age-related nuclear cataract, left eye: Secondary | ICD-10-CM | POA: Insufficient documentation

## 2017-12-04 DIAGNOSIS — I447 Left bundle-branch block, unspecified: Secondary | ICD-10-CM | POA: Diagnosis not present

## 2017-12-04 HISTORY — DX: Cardiac arrhythmia, unspecified: I49.9

## 2017-12-04 HISTORY — DX: Personal history of other diseases of the digestive system: Z87.19

## 2017-12-04 HISTORY — PX: CATARACT EXTRACTION W/PHACO: SHX586

## 2017-12-04 HISTORY — DX: Left bundle-branch block, unspecified: I44.7

## 2017-12-04 SURGERY — PHACOEMULSIFICATION, CATARACT, WITH IOL INSERTION
Anesthesia: Monitor Anesthesia Care | Site: Eye | Laterality: Left | Wound class: "Clean "

## 2017-12-04 MED ORDER — MIDAZOLAM HCL 2 MG/2ML IJ SOLN
INTRAMUSCULAR | Status: DC | PRN
  Administered 2017-12-04 (×2): 1 mg via INTRAVENOUS

## 2017-12-04 MED ORDER — ARMC OPHTHALMIC DILATING DROPS
1.0000 "application " | OPHTHALMIC | Status: AC
  Administered 2017-12-04 (×2): 1 via OPHTHALMIC

## 2017-12-04 MED ORDER — POVIDONE-IODINE 5 % OP SOLN
OPHTHALMIC | Status: DC | PRN
  Administered 2017-12-04: 1 via OPHTHALMIC

## 2017-12-04 MED ORDER — LIDOCAINE HCL (PF) 4 % IJ SOLN
INTRAOCULAR | Status: DC | PRN
  Administered 2017-12-04: 4 mL via OPHTHALMIC

## 2017-12-04 MED ORDER — FENTANYL CITRATE (PF) 100 MCG/2ML IJ SOLN
INTRAMUSCULAR | Status: AC
  Filled 2017-12-04: qty 2

## 2017-12-04 MED ORDER — EPINEPHRINE PF 1 MG/ML IJ SOLN
INTRAOCULAR | Status: DC | PRN
  Administered 2017-12-04: 09:00:00 via OPHTHALMIC

## 2017-12-04 MED ORDER — ONDANSETRON HCL 4 MG/2ML IJ SOLN
INTRAMUSCULAR | Status: DC | PRN
  Administered 2017-12-04: 4 mg via INTRAVENOUS

## 2017-12-04 MED ORDER — ARMC OPHTHALMIC DILATING DROPS
OPHTHALMIC | Status: AC
  Administered 2017-12-04: 1 via OPHTHALMIC
  Filled 2017-12-04: qty 0.5

## 2017-12-04 MED ORDER — MOXIFLOXACIN HCL 0.5 % OP SOLN
OPHTHALMIC | Status: AC
  Filled 2017-12-04: qty 3

## 2017-12-04 MED ORDER — POVIDONE-IODINE 5 % OP SOLN
OPHTHALMIC | Status: AC
  Filled 2017-12-04: qty 30

## 2017-12-04 MED ORDER — MIDAZOLAM HCL 2 MG/2ML IJ SOLN
INTRAMUSCULAR | Status: AC
  Filled 2017-12-04: qty 2

## 2017-12-04 MED ORDER — NA CHONDROIT SULF-NA HYALURON 40-17 MG/ML IO SOLN
INTRAOCULAR | Status: AC
  Filled 2017-12-04: qty 1

## 2017-12-04 MED ORDER — SODIUM CHLORIDE 0.9 % IV SOLN
INTRAVENOUS | Status: DC
  Administered 2017-12-04: 08:00:00 via INTRAVENOUS

## 2017-12-04 MED ORDER — CARBACHOL 0.01 % IO SOLN
INTRAOCULAR | Status: DC | PRN
  Administered 2017-12-04: 0.5 mL via INTRAOCULAR

## 2017-12-04 MED ORDER — MOXIFLOXACIN HCL 0.5 % OP SOLN
OPHTHALMIC | Status: DC | PRN
  Administered 2017-12-04: 0.2 mL via OPHTHALMIC

## 2017-12-04 MED ORDER — MOXIFLOXACIN HCL 0.5 % OP SOLN: 1.0000 [drp] | OPHTHALMIC | Status: DC | PRN

## 2017-12-04 MED ORDER — LIDOCAINE HCL (PF) 4 % IJ SOLN
INTRAMUSCULAR | Status: AC
  Filled 2017-12-04: qty 5

## 2017-12-04 MED ORDER — NA CHONDROIT SULF-NA HYALURON 40-17 MG/ML IO SOLN
INTRAOCULAR | Status: DC | PRN
  Administered 2017-12-04: 1 mL via INTRAOCULAR

## 2017-12-04 MED ORDER — EPINEPHRINE PF 1 MG/ML IJ SOLN
INTRAMUSCULAR | Status: AC
  Filled 2017-12-04: qty 2

## 2017-12-04 MED ORDER — FENTANYL CITRATE (PF) 100 MCG/2ML IJ SOLN
INTRAMUSCULAR | Status: DC | PRN
  Administered 2017-12-04 (×2): 50 ug via INTRAVENOUS

## 2017-12-04 SURGICAL SUPPLY — 16 items
GLOVE BIO SURGEON STRL SZ8 (GLOVE) ×2 IMPLANT
GLOVE BIOGEL M 6.5 STRL (GLOVE) ×2 IMPLANT
GLOVE SURG LX 8.0 MICRO (GLOVE) ×1
GLOVE SURG LX STRL 8.0 MICRO (GLOVE) ×1 IMPLANT
GOWN STRL REUS W/ TWL LRG LVL3 (GOWN DISPOSABLE) ×2 IMPLANT
GOWN STRL REUS W/TWL LRG LVL3 (GOWN DISPOSABLE) ×4
LABEL CATARACT MEDS ST (LABEL) ×2 IMPLANT
LENS IOL TECNIS ITEC 15.5 (Intraocular Lens) ×1 IMPLANT
PACK CATARACT (MISCELLANEOUS) ×2 IMPLANT
PACK CATARACT BRASINGTON LX (MISCELLANEOUS) ×2 IMPLANT
PACK EYE AFTER SURG (MISCELLANEOUS) ×2 IMPLANT
SOL BSS BAG (MISCELLANEOUS) ×2
SOLUTION BSS BAG (MISCELLANEOUS) ×1 IMPLANT
SYR 5ML LL (SYRINGE) ×2 IMPLANT
WATER STERILE IRR 250ML POUR (IV SOLUTION) ×2 IMPLANT
WIPE NON LINTING 3.25X3.25 (MISCELLANEOUS) ×2 IMPLANT

## 2017-12-04 NOTE — Op Note (Signed)
PREOPERATIVE DIAGNOSIS:  Nuclear sclerotic cataract of the left eye.   POSTOPERATIVE DIAGNOSIS:  Nuclear sclerotic cataract of the left eye.   OPERATIVE PROCEDURE: Procedure(s): CATARACT EXTRACTION PHACO AND INTRAOCULAR LENS PLACEMENT (IOC)   SURGEON:  Birder Robson, MD.   ANESTHESIA:  Anesthesiologist: Durenda Hurt, MD CRNA: Disser, Einar Grad, CRNA  1.      Managed anesthesia care. 2.     0.22ml of Shugarcaine was instilled following the paracentesis   COMPLICATIONS:  None.   TECHNIQUE:   Stop and chop   DESCRIPTION OF PROCEDURE:  The patient was examined and consented in the preoperative holding area where the aforementioned topical anesthesia was applied to the left eye and then brought back to the Operating Room where the left eye was prepped and draped in the usual sterile ophthalmic fashion and a lid speculum was placed. A paracentesis was created with the side port blade and the anterior chamber was filled with viscoelastic. A near clear corneal incision was performed with the steel keratome. A continuous curvilinear capsulorrhexis was performed with a cystotome followed by the capsulorrhexis forceps. Hydrodissection and hydrodelineation were carried out with BSS on a blunt cannula. The lens was removed in a stop and chop  technique and the remaining cortical material was removed with the irrigation-aspiration handpiece. The capsular bag was inflated with viscoelastic and the Technis [id number] lens was placed in the capsular bag without complication. The remaining viscoelastic was removed from the eye with the irrigation-aspiration handpiece. The wounds were hydrated. The anterior chamber was flushed with Miostat and the eye was inflated to physiologic pressure. 0.58ml Vigamox was placed in the anterior chamber. The wounds were found to be water tight. The eye was dressed with Vigamox. The patient was given protective glasses to wear throughout the day and a shield with which to  sleep tonight. The patient was also given drops with which to begin a drop regimen today and will follow-up with me in one day. Implant Name Type Inv. Item Serial No. Manufacturer Lot No. LRB No. Used  LENS IOL DIOP 15.5 - [id number] Intraocular Lens LENS IOL DIOP 15.5 [id number] AMO  Left 1    Procedure(s) with comments: CATARACT EXTRACTION PHACO AND INTRAOCULAR LENS PLACEMENT (IOC) (Left) - [place] 01: 13.5 AP% 18.8 CDE 13.85 Fluid Pack lot # [id number]  Electronically signed: Birder Robson 12/04/2017 9:40 AM

## 2017-12-04 NOTE — OR Nursing (Signed)
Epic chart indicates IV started left arm; however was in right wrist, d/c'd postop, gauze/paper tape applied.

## 2017-12-04 NOTE — Transfer of Care (Signed)
Immediate Anesthesia Transfer of Care Note  Patient: Brandy Golden  Procedure(s) Performed: CATARACT EXTRACTION PHACO AND INTRAOCULAR LENS PLACEMENT (IOC) (Left Eye)  Patient Location: PACU  Anesthesia Type:MAC  Level of Consciousness: awake, alert  and oriented  Airway & Oxygen Therapy: Patient Spontanous Breathing  Post-op Assessment: Report given to RN and Post -op Vital signs reviewed and stable  Post vital signs: Reviewed and stable  Last Vitals:  Vitals Value Taken Time  BP    Temp    Pulse    Resp    SpO2      Last Pain:  Vitals:   12/04/17 0804  TempSrc: Tympanic         Complications: No apparent anesthesia complications

## 2017-12-04 NOTE — Discharge Instructions (Addendum)
Eye Surgery Discharge Instructions  Expect mild scratchy sensation or mild soreness. DO NOT RUB YOUR EYE!  The day of surgery:  Minimal physical activity, but bed rest is not required  No reading, computer work, or close hand work  No bending, lifting, or straining.  May watch TV  For 24 hours:  No driving, legal decisions, or alcoholic beverages  Safety precautions  Eat anything you prefer: It is better to start with liquids, then soup then solid foods.  Solar shield eyeglasses should be worn for comfort in the sunlight/patch while sleeping  Resume all regular medications including aspirin or Coumadin if these were discontinued prior to surgery. You may shower, bathe, shave, or wash your hair. Tylenol may be taken for mild discomfort. FOLLOW DR. PORFILIO'S EYE DROP INSTRUCTION SHEET AS REVIEWED.  Call your doctor if you experience significant pain, nausea, or vomiting, fever > 101 or other signs of infection. [phone] or [phone] Specific instructions:  Follow-up Information    Birder Robson, MD Follow up.   Specialty:  Ophthalmology Why:  12/05/17 @ 9:00 am Contact information: [address] [place] [address] [phone]

## 2017-12-04 NOTE — Anesthesia Preprocedure Evaluation (Signed)
Anesthesia Evaluation  Patient identified by MRN, date of birth, ID band Patient awake    Reviewed: Allergy & Precautions, H&P , NPO status , Patient's Chart, lab work & pertinent test results  History of Anesthesia Complications Negative for: history of anesthetic complications  Airway Mallampati: II  TM Distance: >3 FB Neck ROM: full    Dental  (+) Partial [place], bridges, implants:   Pulmonary neg pulmonary ROS,           Cardiovascular hypertension, negative cardio ROS  (-) dysrhythmias      Neuro/Psych PSYCHIATRIC DISORDERS Anxiety Depression  Neuromuscular disease negative neurological ROS  negative psych ROS   GI/Hepatic negative GI ROS, Neg liver ROS, hiatal hernia, GERD  ,  Endo/Other  negative endocrine ROSHypothyroidism   Renal/GU      Musculoskeletal  (+) Fibromyalgia -  Abdominal   Peds  Hematology negative hematology ROS (+) anemia ,   Anesthesia Other Findings Past Medical History: No date: Anemia, iron deficiency No date: Anxiety No date: Blood transfusion without reported diagnosis No date: Cancer ([place])     Comment:  bacal cell nd squmous cell skin cancers No date: Colon polyps No date: Depression No date: Diarrhea No date: Diverticulosis No date: Dysrhythmia No date: Fibrocystic Breast disease     Comment:  pt denies No date: Fibromyalgia     Comment:  pt denies No date: Gastrointestinal hemorrhage, hx of No date: GERD (gastroesophageal reflux disease) No date: History of hiatal hernia No date: Hx of basal cell carcinoma     Comment:  and squamous cell skin cancer No date: Hypertension No date: IBS (irritable bowel syndrome) No date: Internal hemorrhoids without mention of complication No date: LBBB (left bundle branch block) No date: Unspecified hypothyroidism  Past Surgical History: No date: ABDOMINAL HYSTERECTOMY No date: COLONOSCOPY No date: HERNIA REPAIR 07/11:  Laparoscopic repair of large hiatal hernia/nissen     Comment:  Dr. Johnathan Hausen No date: Left foot surgery No date: nissen fundoplycation No date: rhinoplasty with submocosal resection No date: Submucosal resection No date: THROAT SURGERY     Comment:  VOCAL CORD No date: THYROIDECTOMY No date: UPPER GASTROINTESTINAL ENDOSCOPY  BMI    Body Mass Index:  29.70 kg/m      Reproductive/Obstetrics negative OB ROS                            Anesthesia Physical Anesthesia Plan  ASA: II  Anesthesia Plan: MAC   Post-op Pain Management:    Induction:   PONV Risk Score and Plan: Midazolam  Airway Management Planned:   Additional Equipment:   Intra-op Plan:   Post-operative Plan:   Informed Consent: I have reviewed the patients History and Physical, chart, labs and discussed the procedure including the risks, benefits and alternatives for the proposed anesthesia with the patient or authorized representative who has indicated his/her understanding and acceptance.     Plan Discussed with: Anesthesiologist, CRNA and Surgeon  Anesthesia Plan Comments:        Anesthesia Quick Evaluation

## 2017-12-04 NOTE — Anesthesia Postprocedure Evaluation (Signed)
Anesthesia Post Note  Patient: Brandy Golden  Procedure(s) Performed: CATARACT EXTRACTION PHACO AND INTRAOCULAR LENS PLACEMENT (IOC) (Left Eye)  Patient location during evaluation: PACU Anesthesia Type: MAC Level of consciousness: awake and alert Pain management: pain level controlled Vital Signs Assessment: post-procedure vital signs reviewed and stable Respiratory status: spontaneous breathing, nonlabored ventilation, respiratory function stable and patient connected to nasal cannula oxygen Cardiovascular status: stable and blood pressure returned to baseline Postop Assessment: no apparent nausea or vomiting Anesthetic complications: no     Last Vitals:  Vitals:   12/04/17 0804 12/04/17 0941  BP: 140/71 (!) 136/51  Pulse: 90 77  Resp:  16  Temp: 36.6 C 36.9 C  SpO2:  95%    Last Pain:  Vitals:   12/04/17 0941  TempSrc: Oral  PainSc: 0-No pain                 Durenda Hurt

## 2017-12-04 NOTE — H&P (Signed)
All labs reviewed. Abnormal studies sent to patients PCP when indicated.  Previous H&P reviewed, patient examined, there are NO CHANGES.  Brandy Golden Porfilio9/3/20199:15 AM

## 2017-12-04 NOTE — Anesthesia Post-op Follow-up Note (Signed)
Anesthesia QCDR form completed.        

## 2017-12-24 ENCOUNTER — Encounter: Payer: Self-pay | Admitting: *Deleted

## 2017-12-25 ENCOUNTER — Encounter: Payer: Self-pay | Admitting: Anesthesiology

## 2017-12-25 ENCOUNTER — Encounter
Admission: RE | Disposition: A | Discharge status: Home / Self Care | Payer: Self-pay | Source: Ambulatory Visit | Attending: Ophthalmology

## 2017-12-25 ENCOUNTER — Ambulatory Visit
Admission: RE | Admit: 2017-12-25 | Discharge: 2017-12-25 | Disposition: A | Discharge status: Home / Self Care | Payer: Medicare Other | Source: Ambulatory Visit | Attending: Ophthalmology | Admitting: Ophthalmology

## 2017-12-25 ENCOUNTER — Ambulatory Visit: Payer: Medicare Other | Admitting: Certified Registered Nurse Anesthetist

## 2017-12-25 DIAGNOSIS — K219 Gastro-esophageal reflux disease without esophagitis: Secondary | ICD-10-CM | POA: Diagnosis not present

## 2017-12-25 DIAGNOSIS — D649 Anemia, unspecified: Secondary | ICD-10-CM | POA: Insufficient documentation

## 2017-12-25 DIAGNOSIS — Z79899 Other long term (current) drug therapy: Secondary | ICD-10-CM | POA: Insufficient documentation

## 2017-12-25 DIAGNOSIS — I10 Essential (primary) hypertension: Secondary | ICD-10-CM | POA: Diagnosis not present

## 2017-12-25 DIAGNOSIS — F329 Major depressive disorder, single episode, unspecified: Secondary | ICD-10-CM | POA: Diagnosis not present

## 2017-12-25 DIAGNOSIS — H2511 Age-related nuclear cataract, right eye: Secondary | ICD-10-CM | POA: Diagnosis present

## 2017-12-25 DIAGNOSIS — E039 Hypothyroidism, unspecified: Secondary | ICD-10-CM | POA: Insufficient documentation

## 2017-12-25 DIAGNOSIS — Z7982 Long term (current) use of aspirin: Secondary | ICD-10-CM | POA: Diagnosis not present

## 2017-12-25 DIAGNOSIS — Z85828 Personal history of other malignant neoplasm of skin: Secondary | ICD-10-CM | POA: Diagnosis not present

## 2017-12-25 HISTORY — PX: CATARACT EXTRACTION W/PHACO: SHX586

## 2017-12-25 SURGERY — PHACOEMULSIFICATION, CATARACT, WITH IOL INSERTION
Anesthesia: Monitor Anesthesia Care | Site: Eye | Laterality: Right

## 2017-12-25 MED ORDER — NA CHONDROIT SULF-NA HYALURON 40-17 MG/ML IO SOLN
INTRAOCULAR | Status: AC
  Filled 2017-12-25: qty 1

## 2017-12-25 MED ORDER — FENTANYL CITRATE (PF) 100 MCG/2ML IJ SOLN: 25.0000 ug | INTRAMUSCULAR | Status: DC | PRN

## 2017-12-25 MED ORDER — MIDAZOLAM HCL 2 MG/2ML IJ SOLN
INTRAMUSCULAR | Status: DC | PRN
  Administered 2017-12-25 (×2): 1 mg via INTRAVENOUS

## 2017-12-25 MED ORDER — NA CHONDROIT SULF-NA HYALURON 40-17 MG/ML IO SOLN
INTRAOCULAR | Status: DC | PRN
  Administered 2017-12-25: 1 mL via INTRAOCULAR

## 2017-12-25 MED ORDER — CARBACHOL 0.01 % IO SOLN
INTRAOCULAR | Status: DC | PRN
  Administered 2017-12-25: .5 mL via INTRAOCULAR

## 2017-12-25 MED ORDER — ONDANSETRON HCL 4 MG/2ML IJ SOLN: 4.0000 mg | Freq: Once | INTRAMUSCULAR | Status: DC | PRN

## 2017-12-25 MED ORDER — MOXIFLOXACIN HCL 0.5 % OP SOLN: 1.0000 [drp] | OPHTHALMIC | Status: DC | PRN

## 2017-12-25 MED ORDER — ARMC OPHTHALMIC DILATING DROPS
1.0000 "application " | OPHTHALMIC | Status: AC
  Administered 2017-12-25 (×3): 1 via OPHTHALMIC

## 2017-12-25 MED ORDER — POVIDONE-IODINE 5 % OP SOLN
OPHTHALMIC | Status: AC
  Filled 2017-12-25: qty 30

## 2017-12-25 MED ORDER — LIDOCAINE HCL (PF) 4 % IJ SOLN
INTRAOCULAR | Status: DC | PRN
  Administered 2017-12-25: 2 mL via OPHTHALMIC

## 2017-12-25 MED ORDER — TETRACAINE HCL 0.5 % OP SOLN
1.0000 [drp] | OPHTHALMIC | Status: AC | PRN
  Administered 2017-12-25 (×3): 1 [drp] via OPHTHALMIC

## 2017-12-25 MED ORDER — LIDOCAINE HCL (PF) 4 % IJ SOLN
INTRAMUSCULAR | Status: AC
  Filled 2017-12-25: qty 5

## 2017-12-25 MED ORDER — MOXIFLOXACIN HCL 0.5 % OP SOLN
OPHTHALMIC | Status: AC
  Filled 2017-12-25: qty 3

## 2017-12-25 MED ORDER — MIDAZOLAM HCL 2 MG/2ML IJ SOLN
INTRAMUSCULAR | Status: AC
  Filled 2017-12-25: qty 2

## 2017-12-25 MED ORDER — EPINEPHRINE PF 1 MG/ML IJ SOLN
INTRAMUSCULAR | Status: AC
  Filled 2017-12-25: qty 1

## 2017-12-25 MED ORDER — EPINEPHRINE PF 1 MG/ML IJ SOLN
INTRAOCULAR | Status: DC | PRN
  Administered 2017-12-25: 1 mL via OPHTHALMIC

## 2017-12-25 MED ORDER — POVIDONE-IODINE 5 % OP SOLN
OPHTHALMIC | Status: DC | PRN
  Administered 2017-12-25: 1 via OPHTHALMIC

## 2017-12-25 MED ORDER — SODIUM CHLORIDE 0.9 % IV SOLN
INTRAVENOUS | Status: DC
  Administered 2017-12-25: 09:00:00 via INTRAVENOUS

## 2017-12-25 MED ORDER — MOXIFLOXACIN HCL 0.5 % OP SOLN
OPHTHALMIC | Status: DC | PRN
  Administered 2017-12-25: .2 mL via OPHTHALMIC

## 2017-12-25 SURGICAL SUPPLY — 16 items
GLOVE BIO SURGEON STRL SZ8 (GLOVE) ×2 IMPLANT
GLOVE BIOGEL M 6.5 STRL (GLOVE) ×2 IMPLANT
GLOVE SURG LX 8.0 MICRO (GLOVE) ×1
GLOVE SURG LX STRL 8.0 MICRO (GLOVE) ×1 IMPLANT
GOWN STRL REUS W/ TWL LRG LVL3 (GOWN DISPOSABLE) ×2 IMPLANT
GOWN STRL REUS W/TWL LRG LVL3 (GOWN DISPOSABLE) ×4
LABEL CATARACT MEDS ST (LABEL) ×2 IMPLANT
LENS IOL TECNIS ITEC 15.5 (Intraocular Lens) ×1 IMPLANT
PACK CATARACT (MISCELLANEOUS) ×2 IMPLANT
PACK CATARACT BRASINGTON LX (MISCELLANEOUS) ×2 IMPLANT
PACK EYE AFTER SURG (MISCELLANEOUS) ×2 IMPLANT
SOL BSS BAG (MISCELLANEOUS) ×2
SOLUTION BSS BAG (MISCELLANEOUS) ×1 IMPLANT
SYR 5ML LL (SYRINGE) ×2 IMPLANT
WATER STERILE IRR 250ML POUR (IV SOLUTION) ×2 IMPLANT
WIPE NON LINTING 3.25X3.25 (MISCELLANEOUS) ×2 IMPLANT

## 2017-12-25 NOTE — Op Note (Signed)
PREOPERATIVE DIAGNOSIS:  Nuclear sclerotic cataract of the right eye.   POSTOPERATIVE DIAGNOSIS:  nuclear sclerotic cataract right eye   OPERATIVE PROCEDURE: Procedure(s): CATARACT EXTRACTION PHACO AND INTRAOCULAR LENS PLACEMENT (IOC)   SURGEON:  Birder Robson, MD.   ANESTHESIA:  Anesthesiologist: Alvin Critchley, MD CRNA: Eben Burow, CRNA  1.      Managed anesthesia care. 2.      0.31ml of Shugarcaine was instilled in the eye following the paracentesis.   COMPLICATIONS:  None.   TECHNIQUE:   Stop and chop   DESCRIPTION OF PROCEDURE:  The patient was examined and consented in the preoperative holding area where the aforementioned topical anesthesia was applied to the right eye and then brought back to the Operating Room where the right eye was prepped and draped in the usual sterile ophthalmic fashion and a lid speculum was placed. A paracentesis was created with the side port blade and the anterior chamber was filled with viscoelastic. A near clear corneal incision was performed with the steel keratome. A continuous curvilinear capsulorrhexis was performed with a cystotome followed by the capsulorrhexis forceps. Hydrodissection and hydrodelineation were carried out with BSS on a blunt cannula. The lens was removed in a stop and chop  technique and the remaining cortical material was removed with the irrigation-aspiration handpiece. The capsular bag was inflated with viscoelastic and the Technis ZCB00  lens was placed in the capsular bag without complication. The remaining viscoelastic was removed from the eye with the irrigation-aspiration handpiece. The wounds were hydrated. The anterior chamber was flushed with Miostat and the eye was inflated to physiologic pressure. 0.44ml of Vigamox was placed in the anterior chamber. The wounds were found to be water tight. The eye was dressed with Vigamox. The patient was given protective glasses to wear throughout the day and a shield with which to  sleep tonight. The patient was also given drops with which to begin a drop regimen today and will follow-up with me in one day. Implant Name Type Inv. Item Serial No. Manufacturer Lot No. LRB No. Used  LENS IOL DIOP 15.5 - [id number] Intraocular Lens LENS IOL DIOP 15.5 [id number]  Right 1   Procedure(s) with comments: CATARACT EXTRACTION PHACO AND INTRAOCULAR LENS PLACEMENT (IOC) (Right) - [place]  00:35 AP% 17.5 CDE 6.23 Fluid pack lot # [id number]  Electronically signed: Birder Robson 12/25/2017 10:06 AM

## 2017-12-25 NOTE — Anesthesia Postprocedure Evaluation (Signed)
Anesthesia Post Note  Patient: Brandy Golden  Procedure(s) Performed: CATARACT EXTRACTION PHACO AND INTRAOCULAR LENS PLACEMENT (IOC) (Right Eye)  Patient location during evaluation: PACU Anesthesia Type: MAC Level of consciousness: awake and alert and oriented Pain management: pain level controlled Vital Signs Assessment: post-procedure vital signs reviewed and stable Respiratory status: spontaneous breathing Cardiovascular status: blood pressure returned to baseline Anesthetic complications: no     Last Vitals:  Vitals:   12/25/17 0837 12/25/17 1005  BP: (!) 151/70 (!) 122/53  Pulse: 79 73  Resp: 16 16  Temp: 36.7 C (!) 36.2 C  SpO2: 97% 100%    Last Pain:  Vitals:   12/25/17 1005  TempSrc: Oral  PainSc: 0-No pain                 Osiris Charles

## 2017-12-25 NOTE — Anesthesia Preprocedure Evaluation (Signed)
Anesthesia Evaluation  Patient identified by MRN, date of birth, ID band Patient awake    Reviewed: Allergy & Precautions, H&P , NPO status , Patient's Chart, lab work & pertinent test results  History of Anesthesia Complications Negative for: history of anesthetic complications  Airway Mallampati: II  TM Distance: >3 FB Neck ROM: full    Dental  (+) Partial [place], bridges, implants:   Pulmonary neg pulmonary ROS,           Cardiovascular hypertension, negative cardio ROS  (-) dysrhythmias      Neuro/Psych PSYCHIATRIC DISORDERS Anxiety Depression  Neuromuscular disease negative neurological ROS  negative psych ROS   GI/Hepatic negative GI ROS, Neg liver ROS, hiatal hernia, GERD  ,  Endo/Other  negative endocrine ROSHypothyroidism   Renal/GU      Musculoskeletal  (+) Fibromyalgia -  Abdominal   Peds  Hematology negative hematology ROS (+) anemia ,   Anesthesia Other Findings Past Medical History: No date: Anemia, iron deficiency No date: Anxiety No date: Blood transfusion without reported diagnosis No date: Cancer ([place])     Comment:  bacal cell nd squmous cell skin cancers No date: Colon polyps No date: Depression No date: Diarrhea No date: Diverticulosis No date: Dysrhythmia No date: Fibrocystic Breast disease     Comment:  pt denies No date: Fibromyalgia     Comment:  pt denies No date: Gastrointestinal hemorrhage, hx of No date: GERD (gastroesophageal reflux disease) No date: History of hiatal hernia No date: Hx of basal cell carcinoma     Comment:  and squamous cell skin cancer No date: Hypertension No date: IBS (irritable bowel syndrome) No date: Internal hemorrhoids without mention of complication No date: LBBB (left bundle branch block) No date: Unspecified hypothyroidism  Past Surgical History: No date: ABDOMINAL HYSTERECTOMY No date: COLONOSCOPY No date: HERNIA REPAIR 07/11:  Laparoscopic repair of large hiatal hernia/nissen     Comment:  Dr. Johnathan Hausen No date: Left foot surgery No date: nissen fundoplycation No date: rhinoplasty with submocosal resection No date: Submucosal resection No date: THROAT SURGERY     Comment:  VOCAL CORD No date: THYROIDECTOMY No date: UPPER GASTROINTESTINAL ENDOSCOPY  BMI    Body Mass Index:  29.70 kg/m      Reproductive/Obstetrics negative OB ROS                             Anesthesia Physical  Anesthesia Plan  ASA: II  Anesthesia Plan: MAC   Post-op Pain Management:    Induction:   PONV Risk Score and Plan: Midazolam  Airway Management Planned: Nasal Cannula  Additional Equipment:   Intra-op Plan:   Post-operative Plan:   Informed Consent: I have reviewed the patients History and Physical, chart, labs and discussed the procedure including the risks, benefits and alternatives for the proposed anesthesia with the patient or authorized representative who has indicated his/her understanding and acceptance.     Plan Discussed with: Anesthesiologist, CRNA and Surgeon  Anesthesia Plan Comments:         Anesthesia Quick Evaluation

## 2017-12-25 NOTE — Discharge Instructions (Signed)
Eye Surgery Discharge Instructions    Expect mild scratchy sensation or mild soreness. DO NOT RUB YOUR EYE!  The day of surgery:  Minimal physical activity, but bed rest is not required  No reading, computer work, or close hand work  No bending, lifting, or straining.  May watch TV  For 24 hours:  No driving, legal decisions, or alcoholic beverages  Safety precautions  Eat anything you prefer: It is better to start with liquids, then soup then solid foods.  _____ Eye patch should be worn until postoperative exam tomorrow.  ____ Solar shield eyeglasses should be worn for comfort in the sunlight/patch while sleeping  Resume all regular medications including aspirin or Coumadin if these were discontinued prior to surgery. You may shower, bathe, shave, or wash your hair. Tylenol may be taken for mild discomfort.  Call your doctor if you experience significant pain, nausea, or vomiting, fever > 101 or other signs of infection. [phone] or [phone] Specific instructions:  Follow-up Information    Birder Robson, MD Follow up on 12/26/2017.   Specialty:  Ophthalmology Why:  appointment time 9:10 AM Contact information: [address] [place] [address] [phone]

## 2017-12-25 NOTE — H&P (Signed)
All labs reviewed. Abnormal studies sent to patients PCP when indicated.  Previous H&P reviewed, patient examined, there are NO CHANGES.  Jud Fanguy Porfilio9/24/20199:41 AM

## 2017-12-25 NOTE — Transfer of Care (Signed)
Immediate Anesthesia Transfer of Care Note  Patient: Brandy Golden  Procedure(s) Performed: CATARACT EXTRACTION PHACO AND INTRAOCULAR LENS PLACEMENT (IOC) (Right Eye)  Patient Location: Short Stay  Anesthesia Type:MAC  Level of Consciousness: awake, alert , oriented and patient cooperative  Airway & Oxygen Therapy: Patient Spontanous Breathing  Post-op Assessment: Report given to RN and Post -op Vital signs reviewed and stable  Post vital signs: Reviewed and stable  Last Vitals:  Vitals Value Taken Time  BP    Temp    Pulse    Resp    SpO2      Last Pain:  Vitals:   12/25/17 0837  PainSc: 0-No pain         Complications: No apparent anesthesia complications

## 2017-12-25 NOTE — Anesthesia Post-op Follow-up Note (Signed)
Anesthesia QCDR form completed.        

## 2017-12-26 ENCOUNTER — Encounter: Payer: Self-pay | Admitting: Ophthalmology

## 2018-03-03 DIAGNOSIS — I639 Cerebral infarction, unspecified: Secondary | ICD-10-CM

## 2018-03-03 HISTORY — DX: Cerebral infarction, unspecified: I63.9

## 2018-04-08 ENCOUNTER — Other Ambulatory Visit: Payer: Self-pay | Admitting: Internal Medicine

## 2018-04-08 DIAGNOSIS — Z1231 Encounter for screening mammogram for malignant neoplasm of breast: Secondary | ICD-10-CM

## 2018-05-09 ENCOUNTER — Ambulatory Visit
Admission: RE | Admit: 2018-05-09 | Discharge: 2018-05-09 | Disposition: A | Discharge status: Home / Self Care | Payer: Medicare Other | Source: Ambulatory Visit | Attending: Internal Medicine | Admitting: Internal Medicine

## 2018-05-09 DIAGNOSIS — Z1231 Encounter for screening mammogram for malignant neoplasm of breast: Secondary | ICD-10-CM

## 2018-09-02 ENCOUNTER — Telehealth: Payer: Self-pay | Admitting: Diagnostic Neuroimaging

## 2018-09-02 NOTE — Telephone Encounter (Signed)
°  Due to current COVID 19 pandemic, our office is severely reducing in office visits until further notice, in order to minimize the risk to our patients and healthcare providers.    Called patient and scheduled a virtual visit with Dr. Leta Baptist for 6/10. Patient verbalized understanding of the doxy.me process and I have sent an e-mail to [email or web address] with link and instructions as well as my name and our office number. Patient understands that they will receive a call from RN to update chart.   Pt understands that although there may be some limitations with this type of visit, we will take all precautions to reduce any security or privacy concerns.  Pt understands that this will be treated like an in office visit and we will file with pt's insurance, and there may be a patient responsible charge related to this service.

## 2018-09-03 ENCOUNTER — Other Ambulatory Visit: Payer: Self-pay | Admitting: Internal Medicine

## 2018-09-03 DIAGNOSIS — R2681 Unsteadiness on feet: Secondary | ICD-10-CM

## 2018-09-11 ENCOUNTER — Ambulatory Visit: Payer: Medicare Other | Admitting: Diagnostic Neuroimaging

## 2018-09-24 ENCOUNTER — Other Ambulatory Visit: Payer: Medicare Other

## 2018-11-12 ENCOUNTER — Telehealth: Payer: Self-pay | Admitting: Internal Medicine

## 2018-11-12 NOTE — Telephone Encounter (Signed)
More than happy to have patient back in my clinic and for procedures when due

## 2018-11-12 NOTE — Telephone Encounter (Signed)
Left message to call back to schedule procedure

## 2018-11-12 NOTE — Telephone Encounter (Signed)
Hi Dr. Hilarie Fredrickson, we have received a referral from patient's PCP for a colonoscopy. The last time that patient saw you was in 2015, she then transferred care to [facility]. Patient stated that she went to [facility] for a hernia repair but was referred to [facility] where she had several GI exams/procedures. However, it was not her intention to transfer care and she would like to have her colonoscopy with you. GI records from [facility] are in [place] under "Care everywhere." Could you please advise if you would take patient back? Thank you.

## 2018-11-14 ENCOUNTER — Telehealth: Payer: Self-pay | Admitting: Internal Medicine

## 2018-11-14 NOTE — Telephone Encounter (Signed)
Pt states she was set up with a GI surgeon at [facility] for hernia repair but she was never seen by them. She was told she needed a thoracic surgeon to have a mass removed from her chest. She had robotic surgery to remove "another chunk of my thyroid that was in my chest attached to my esophagus." During this surgery one of her vocal cords was damaged. She had to see an otolaryngologist and have speech therapy. Repair was attempted but unsuccessful. She saw another otolaryngologist at [facility] and he was able to repair the vocal cord and she can talk now. She does not want to have hernia repair at [facility] now she will have at [facility].   She is due for colon but wants to know if Dr. Hilarie Fredrickson feels she needs to have an EGD to look at the Hernia again. Please advise.

## 2018-11-18 NOTE — Telephone Encounter (Signed)
Pt states she does not really want to have the hernia repaired. She states she had some tests done at [facility] and she is going to mail them to Dr. Hilarie Fredrickson to review. States after he reviews those results she will schedule a virtual visit. Dr. Hilarie Fredrickson aware.

## 2018-11-18 NOTE — Telephone Encounter (Signed)
The decision to repeat the upper endoscopy would be based on her plans.  If she is planning to have a hiatal hernia repair then repeat endoscopy is recommended. It is also recommended if she is having issues with swallowing, reflux, upper abdominal pain, nausea or vomiting. If she would prefer I could have a in person or virtual visit prior to this decision to repeat the upper endoscopy

## 2019-01-03 ENCOUNTER — Encounter: Payer: Self-pay | Admitting: Internal Medicine

## 2019-01-28 ENCOUNTER — Other Ambulatory Visit: Payer: Self-pay

## 2019-01-28 ENCOUNTER — Ambulatory Visit (AMBULATORY_SURGERY_CENTER): Payer: Self-pay

## 2019-01-28 ENCOUNTER — Encounter: Payer: Self-pay | Admitting: Internal Medicine

## 2019-01-28 VITALS — Temp 98.2°F | Ht 69.75 in | Wt 209.4 lb

## 2019-01-28 DIAGNOSIS — Z1211 Encounter for screening for malignant neoplasm of colon: Secondary | ICD-10-CM

## 2019-01-28 MED ORDER — NA SULFATE-K SULFATE-MG SULF 17.5-3.13-1.6 GM/177ML PO SOLN: 1.0000 | Freq: Once | ORAL | 0 refills | Status: AC

## 2019-01-28 NOTE — Progress Notes (Signed)
Denies allergies to eggs or soy products. Denies complication of anesthesia or sedation. Denies use of weight loss medication. Denies use of O2.   Emmi instructions given for colonoscopy.  Covid 19 is scheduled for Monday 02/03/19 @ 9:25 Am.

## 2019-02-03 LAB — SARS CORONAVIRUS 2 (TAT 6-24 HRS): SARS Coronavirus 2: NEGATIVE

## 2019-02-05 ENCOUNTER — Encounter: Payer: Self-pay | Admitting: Internal Medicine

## 2019-02-05 ENCOUNTER — Other Ambulatory Visit: Payer: Self-pay

## 2019-02-05 ENCOUNTER — Ambulatory Visit (AMBULATORY_SURGERY_CENTER): Payer: Medicare Other | Admitting: Internal Medicine

## 2019-02-05 VITALS — BP 138/82 | HR 44 | Temp 98.1°F | Resp 16 | Ht 69.0 in | Wt 209.0 lb

## 2019-02-05 DIAGNOSIS — Z1211 Encounter for screening for malignant neoplasm of colon: Secondary | ICD-10-CM

## 2019-02-05 MED ORDER — SODIUM CHLORIDE 0.9 % IV SOLN: 500.0000 mL | INTRAVENOUS | Status: DC

## 2019-02-05 NOTE — Patient Instructions (Signed)
YOU HAD AN ENDOSCOPIC PROCEDURE TODAY AT THE [facility]:   Refer to the procedure report that was given to you for any specific questions about what was found during the examination.  If the procedure report does not answer your questions, please call your gastroenterologist to clarify.  If you requested that your care partner not be given the details of your procedure findings, then the procedure report has been included in a sealed envelope for you to review at your convenience later.  YOU SHOULD EXPECT: Some feelings of bloating in the abdomen. Passage of more gas than usual.  Walking can help get rid of the air that was put into your GI tract during the procedure and reduce the bloating. If you had a lower endoscopy (such as a colonoscopy or flexible sigmoidoscopy) you may notice spotting of blood in your stool or on the toilet paper. If you underwent a bowel prep for your procedure, you may not have a normal bowel movement for a few days.  Please Note:  You might notice some irritation and congestion in your nose or some drainage.  This is from the oxygen used during your procedure.  There is no need for concern and it should clear up in a day or so.  SYMPTOMS TO REPORT IMMEDIATELY:   Following lower endoscopy (colonoscopy or flexible sigmoidoscopy):  Excessive amounts of blood in the stool  Significant tenderness or worsening of abdominal pains  Swelling of the abdomen that is new, acute  Fever of 100F or higher   For urgent or emergent issues, a gastroenterologist can be reached at any hour by calling [phone].   DIET:  We do recommend a small meal at first, but then you may proceed to your regular diet.  Drink plenty of fluids but you should avoid alcoholic beverages for 24 hours.  MEDICATIONS: Continue present medications.  Please see handouts given to you by your recovery nurse.  ACTIVITY:  You should plan to take it easy for the rest of today and you should  NOT DRIVE or use heavy machinery until tomorrow (because of the sedation medicines used during the test).    FOLLOW UP: Our staff will call the number listed on your records 48-72 hours following your procedure to check on you and address any questions or concerns that you may have regarding the information given to you following your procedure. If we do not reach you, we will leave a message.  We will attempt to reach you two times.  During this call, we will ask if you have developed any symptoms of COVID 19. If you develop any symptoms (ie: fever, flu-like symptoms, shortness of breath, cough etc.) before then, please call [phone].  If you test positive for Covid 19 in the 2 weeks post procedure, please call and report this information to us.    If any biopsies were taken you will be contacted by phone or by letter within the next 1-3 weeks.  Please call us at [phone] if you have not heard about the biopsies in 3 weeks.   Thank you for allowing us to provide for your healthcare needs today.   SIGNATURES/CONFIDENTIALITY: You and/or your care partner have signed paperwork which will be entered into your electronic medical record.  These signatures attest to the fact that that the information above on your After Visit Summary has been reviewed and is understood.  Full responsibility of the confidentiality of this discharge information lies with you and/or   your care-partner. 

## 2019-02-05 NOTE — Op Note (Signed)
[facility] Patient Name: Brandy Golden Procedure Date: 02/05/2019 10:11 AM MRN: [id number] Endoscopist: Jerene Bears , MD Age: 73 Referring MD:  Date of Birth: 1945-10-29 Gender: Female Account #: [id number] Procedure:                Colonoscopy Indications:              Screening for colorectal malignant neoplasm, Last                            colonoscopy: 2010 Medicines:                Monitored Anesthesia Care Procedure:                Pre-Anesthesia Assessment:                           - Prior to the procedure, a History and Physical                            was performed, and patient medications and                            allergies were reviewed. The patient's tolerance of                            previous anesthesia was also reviewed. The risks                            and benefits of the procedure and the sedation                            options and risks were discussed with the patient.                            All questions were answered, and informed consent                            was obtained. Prior Anticoagulants: The patient has                            taken no previous anticoagulant or antiplatelet                            agents. ASA Grade Assessment: II - A patient with                            mild systemic disease. After reviewing the risks                            and benefits, the patient was deemed in                            satisfactory condition to undergo the procedure.  After obtaining informed consent, the colonoscope                            was passed under direct vision. Throughout the                            procedure, the patient's blood pressure, pulse, and                            oxygen saturations were monitored continuously. The                            Colonoscope was introduced through the anus and                            advanced to the cecum, identified by  appendiceal                            orifice and ileocecal valve. The colonoscopy was                            performed without difficulty. The patient tolerated                            the procedure well. The quality of the bowel                            preparation was good. The ileocecal valve,                            appendiceal orifice, and rectum were photographed. Scope In: 10:25:21 AM Scope Out: 10:40:30 AM Scope Withdrawal Time: 0 hours 10 minutes 51 seconds  Total Procedure Duration: 0 hours 15 minutes 9 seconds  Findings:                 The digital rectal exam was normal.                           Multiple small and large-mouthed diverticula were                            found in the sigmoid colon, descending colon and                            ascending colon.                           Internal hemorrhoids were found during                            retroflexion. The hemorrhoids were small.                           The exam was otherwise without abnormality. Complications:            No immediate  complications. Estimated Blood Loss:     Estimated blood loss: none. Impression:               - Diverticulosis in the sigmoid colon, in the                            descending colon and in the ascending colon.                           - Internal hemorrhoids.                           - The examination was otherwise normal.                           - No specimens collected. Recommendation:           - Patient has a contact number available for                            emergencies. The signs and symptoms of potential                            delayed complications were discussed with the                            patient. Return to normal activities tomorrow.                            Written discharge instructions were provided to the                            patient.                           - Resume previous diet.                           -  Continue present medications.                           - No repeat colonoscopy due to age and the absence                            of colonic polyps. Jerene Bears, MD 02/05/2019 10:49:53 AM This report has been signed electronically.

## 2019-02-05 NOTE — Progress Notes (Signed)
To PACU, VSS. Report to [email or web address] 

## 2019-02-05 NOTE — Progress Notes (Signed)
Temp JB V/S CW 

## 2019-02-07 ENCOUNTER — Telehealth: Payer: Self-pay

## 2019-02-07 NOTE — Telephone Encounter (Signed)
  Follow up Call-  Call back number 02/05/2019  Post procedure Call Back phone  # [phone]  Permission to leave phone message Yes  Some recent data might be hidden     Patient questions:  Do you have a fever, pain , or abdominal swelling? No. Pain Score  0 *  Have you tolerated food without any problems? Yes.    Have you been able to return to your normal activities? Yes.    Do you have any questions about your discharge instructions: Diet   No. Medications  No. Follow up visit  No.  Do you have questions or concerns about your Care? Yes.    Actions: * If pain score is 4 or above: No action needed, pain <4.

## 2019-03-03 ENCOUNTER — Encounter (HOSPITAL_COMMUNITY): Payer: Self-pay | Admitting: Emergency Medicine

## 2019-03-03 ENCOUNTER — Other Ambulatory Visit: Payer: Self-pay

## 2019-03-03 ENCOUNTER — Emergency Department (HOSPITAL_COMMUNITY)
Admission: EM | Admit: 2019-03-03 | Discharge: 2019-03-03 | Disposition: A | Discharge status: Home / Self Care | Payer: Medicare Other | Attending: Emergency Medicine | Admitting: Emergency Medicine

## 2019-03-03 DIAGNOSIS — Z5321 Procedure and treatment not carried out due to patient leaving prior to being seen by health care provider: Secondary | ICD-10-CM | POA: Insufficient documentation

## 2019-03-03 DIAGNOSIS — Z043 Encounter for examination and observation following other accident: Secondary | ICD-10-CM | POA: Insufficient documentation

## 2019-03-03 NOTE — ED Triage Notes (Signed)
Pt fell over spacer in parking lot when getting her husband from doctor when didn't see them. Pt has hematoma to right eye, busted lip, abrasion to nose. Denies taking blood thinners or LOC

## 2019-03-04 ENCOUNTER — Other Ambulatory Visit: Payer: Self-pay

## 2019-03-04 ENCOUNTER — Emergency Department: Payer: Medicare Other

## 2019-03-04 ENCOUNTER — Encounter: Payer: Self-pay | Admitting: Emergency Medicine

## 2019-03-04 ENCOUNTER — Inpatient Hospital Stay (HOSPITAL_COMMUNITY)
Admission: AD | Admit: 2019-03-04 | Payer: Medicare Other | Source: Other Acute Inpatient Hospital | Admitting: Internal Medicine

## 2019-03-04 ENCOUNTER — Inpatient Hospital Stay
Admission: EM | Admit: 2019-03-04 | Discharge: 2019-03-08 | DRG: 228 | Disposition: A | Discharge status: Home / Self Care | Payer: Medicare Other | Source: Ambulatory Visit | Attending: Internal Medicine | Admitting: Internal Medicine

## 2019-03-04 DIAGNOSIS — S0011XA Contusion of right eyelid and periocular area, initial encounter: Secondary | ICD-10-CM | POA: Diagnosis present

## 2019-03-04 DIAGNOSIS — Y92481 Parking lot as the place of occurrence of the external cause: Secondary | ICD-10-CM | POA: Diagnosis not present

## 2019-03-04 DIAGNOSIS — Z9181 History of falling: Secondary | ICD-10-CM | POA: Diagnosis not present

## 2019-03-04 DIAGNOSIS — Z8619 Personal history of other infectious and parasitic diseases: Secondary | ICD-10-CM | POA: Diagnosis not present

## 2019-03-04 DIAGNOSIS — K219 Gastro-esophageal reflux disease without esophagitis: Secondary | ICD-10-CM | POA: Diagnosis present

## 2019-03-04 DIAGNOSIS — E039 Hypothyroidism, unspecified: Secondary | ICD-10-CM | POA: Diagnosis present

## 2019-03-04 DIAGNOSIS — I10 Essential (primary) hypertension: Secondary | ICD-10-CM | POA: Diagnosis not present

## 2019-03-04 DIAGNOSIS — Z20828 Contact with and (suspected) exposure to other viral communicable diseases: Secondary | ICD-10-CM | POA: Diagnosis present

## 2019-03-04 DIAGNOSIS — S065X0A Traumatic subdural hemorrhage without loss of consciousness, initial encounter: Secondary | ICD-10-CM | POA: Diagnosis present

## 2019-03-04 DIAGNOSIS — S00531A Contusion of lip, initial encounter: Secondary | ICD-10-CM | POA: Diagnosis present

## 2019-03-04 DIAGNOSIS — F418 Other specified anxiety disorders: Secondary | ICD-10-CM | POA: Diagnosis present

## 2019-03-04 DIAGNOSIS — I4510 Unspecified right bundle-branch block: Secondary | ICD-10-CM | POA: Diagnosis present

## 2019-03-04 DIAGNOSIS — I442 Atrioventricular block, complete: Principal | ICD-10-CM | POA: Diagnosis present

## 2019-03-04 DIAGNOSIS — I459 Conduction disorder, unspecified: Secondary | ICD-10-CM | POA: Diagnosis present

## 2019-03-04 DIAGNOSIS — Z7982 Long term (current) use of aspirin: Secondary | ICD-10-CM | POA: Diagnosis not present

## 2019-03-04 DIAGNOSIS — W01198A Fall on same level from slipping, tripping and stumbling with subsequent striking against other object, initial encounter: Secondary | ICD-10-CM | POA: Diagnosis present

## 2019-03-04 DIAGNOSIS — E890 Postprocedural hypothyroidism: Secondary | ICD-10-CM | POA: Diagnosis present

## 2019-03-04 DIAGNOSIS — K5790 Diverticulosis of intestine, part unspecified, without perforation or abscess without bleeding: Secondary | ICD-10-CM | POA: Diagnosis present

## 2019-03-04 DIAGNOSIS — Z8249 Family history of ischemic heart disease and other diseases of the circulatory system: Secondary | ICD-10-CM | POA: Diagnosis not present

## 2019-03-04 DIAGNOSIS — S065X9A Traumatic subdural hemorrhage with loss of consciousness of unspecified duration, initial encounter: Secondary | ICD-10-CM | POA: Diagnosis not present

## 2019-03-04 DIAGNOSIS — M797 Fibromyalgia: Secondary | ICD-10-CM | POA: Diagnosis present

## 2019-03-04 DIAGNOSIS — Z79890 Hormone replacement therapy (postmenopausal): Secondary | ICD-10-CM | POA: Diagnosis not present

## 2019-03-04 DIAGNOSIS — I5032 Chronic diastolic (congestive) heart failure: Secondary | ICD-10-CM | POA: Diagnosis present

## 2019-03-04 DIAGNOSIS — R350 Frequency of micturition: Secondary | ICD-10-CM | POA: Diagnosis present

## 2019-03-04 DIAGNOSIS — Z79899 Other long term (current) drug therapy: Secondary | ICD-10-CM

## 2019-03-04 DIAGNOSIS — W19XXXA Unspecified fall, initial encounter: Secondary | ICD-10-CM | POA: Diagnosis present

## 2019-03-04 DIAGNOSIS — Z888 Allergy status to other drugs, medicaments and biological substances status: Secondary | ICD-10-CM | POA: Diagnosis not present

## 2019-03-04 DIAGNOSIS — S065XAA Traumatic subdural hemorrhage with loss of consciousness status unknown, initial encounter: Secondary | ICD-10-CM | POA: Diagnosis present

## 2019-03-04 LAB — TROPONIN I (HIGH SENSITIVITY)
Troponin I (High Sensitivity): 6 ng/L (ref <18)
Troponin I (High Sensitivity): 7 ng/L (ref <18)

## 2019-03-04 LAB — TSH: TSH: 1.392 u[IU]/mL (ref 0.350–4.500)

## 2019-03-04 LAB — CBC WITH DIFFERENTIAL/PLATELET
Abs Immature Granulocytes: 0.02 10*3/uL (ref 0.00–0.07)
Basophils Absolute: 0 10*3/uL (ref 0.0–0.1)
Basophils Relative: 0 %
Eosinophils Absolute: 0.1 10*3/uL (ref 0.0–0.5)
Eosinophils Relative: 1 %
HCT: 38.6 % (ref 36.0–46.0)
Hemoglobin: 12.6 g/dL (ref 12.0–15.0)
Immature Granulocytes: 0 %
Lymphocytes Relative: 16 %
Lymphs Abs: 1.1 10*3/uL (ref 0.7–4.0)
MCH: 30.1 pg (ref 26.0–34.0)
MCHC: 32.6 g/dL (ref 30.0–36.0)
MCV: 92.3 fL (ref 80.0–100.0)
Monocytes Absolute: 0.5 10*3/uL (ref 0.1–1.0)
Monocytes Relative: 8 %
Neutro Abs: 4.8 10*3/uL (ref 1.7–7.7)
Neutrophils Relative %: 75 %
Platelets: 144 10*3/uL — ABNORMAL LOW (ref 150–400)
RBC: 4.18 MIL/uL (ref 3.87–5.11)
RDW: 13.7 % (ref 11.5–15.5)
WBC: 6.4 10*3/uL (ref 4.0–10.5)
nRBC: 0 % (ref 0.0–0.2)

## 2019-03-04 LAB — COMPREHENSIVE METABOLIC PANEL
ALT: 19 U/L (ref 0–44)
AST: 22 U/L (ref 15–41)
Albumin: 3.9 g/dL (ref 3.5–5.0)
Alkaline Phosphatase: 67 U/L (ref 38–126)
Anion gap: 11 (ref 5–15)
BUN: 25 mg/dL — ABNORMAL HIGH (ref 8–23)
CO2: 23 mmol/L (ref 22–32)
Calcium: 8.6 mg/dL — ABNORMAL LOW (ref 8.9–10.3)
Chloride: 108 mmol/L (ref 98–111)
Creatinine, Ser: 0.91 mg/dL (ref 0.44–1.00)
GFR calc Af Amer: >60 mL/min (ref >60)
GFR calc non Af Amer: >60 mL/min (ref >60)
Glucose, Bld: 135 mg/dL — ABNORMAL HIGH (ref 70–99)
Potassium: 3.7 mmol/L (ref 3.5–5.1)
Sodium: 142 mmol/L (ref 135–145)
Total Bilirubin: 0.9 mg/dL (ref 0.3–1.2)
Total Protein: 6.9 g/dL (ref 6.5–8.1)

## 2019-03-04 LAB — MAGNESIUM: Magnesium: 1.8 mg/dL (ref 1.7–2.4)

## 2019-03-04 MED ORDER — VITAMIN C 500 MG PO TABS
1000.0000 mg | ORAL_TABLET | Freq: Every day | ORAL | Status: DC
  Administered 2019-03-05 – 2019-03-06 (×2): 1000 mg via ORAL
  Filled 2019-03-04 (×2): qty 2

## 2019-03-04 MED ORDER — BACITRACIN ZINC 500 UNIT/GM EX OINT
TOPICAL_OINTMENT | Freq: Two times a day (BID) | CUTANEOUS | Status: DC
  Administered 2019-03-05 – 2019-03-06 (×4): 1 via TOPICAL
  Administered 2019-03-07: 21:00:00 via TOPICAL
  Filled 2019-03-04 (×4): qty 0.9

## 2019-03-04 MED ORDER — ZINC SULFATE 220 (50 ZN) MG PO CAPS
220.0000 mg | ORAL_CAPSULE | Freq: Every day | ORAL | Status: DC
  Administered 2019-03-05 – 2019-03-08 (×3): 220 mg via ORAL
  Filled 2019-03-04 (×3): qty 1

## 2019-03-04 MED ORDER — ZINC GLUCONATE 50 MG PO TABS: 50.0000 mg | ORAL_TABLET | Freq: Every day | ORAL | Status: DC

## 2019-03-04 MED ORDER — PANTOPRAZOLE SODIUM 40 MG PO TBEC
40.0000 mg | DELAYED_RELEASE_TABLET | Freq: Every day | ORAL | Status: DC
  Administered 2019-03-05 – 2019-03-08 (×3): 40 mg via ORAL
  Filled 2019-03-04 (×3): qty 1

## 2019-03-04 MED ORDER — ONDANSETRON 4 MG PO TBDP: 4.0000 mg | ORAL_TABLET | Freq: Three times a day (TID) | ORAL | Status: DC | PRN

## 2019-03-04 MED ORDER — ASPIRIN EC 81 MG PO TBEC: 81.0000 mg | DELAYED_RELEASE_TABLET | Freq: Every day | ORAL | Status: DC

## 2019-03-04 MED ORDER — ALPRAZOLAM 0.5 MG PO TABS
0.5000 mg | ORAL_TABLET | Freq: Two times a day (BID) | ORAL | Status: DC | PRN
  Administered 2019-03-05 – 2019-03-06 (×5): 0.5 mg via ORAL
  Filled 2019-03-04 (×5): qty 1

## 2019-03-04 MED ORDER — LISINOPRIL 10 MG PO TABS
10.0000 mg | ORAL_TABLET | Freq: Every day | ORAL | Status: DC
  Administered 2019-03-05: 10 mg via ORAL
  Filled 2019-03-04: qty 1

## 2019-03-04 MED ORDER — SERTRALINE HCL 50 MG PO TABS
100.0000 mg | ORAL_TABLET | Freq: Every day | ORAL | Status: DC
  Administered 2019-03-05 – 2019-03-08 (×3): 100 mg via ORAL
  Filled 2019-03-04 (×3): qty 2

## 2019-03-04 MED ORDER — ACETAMINOPHEN 325 MG PO TABS
650.0000 mg | ORAL_TABLET | Freq: Four times a day (QID) | ORAL | Status: DC | PRN
  Administered 2019-03-05 – 2019-03-08 (×5): 650 mg via ORAL
  Filled 2019-03-04 (×6): qty 2

## 2019-03-04 MED ORDER — LEVOTHYROXINE SODIUM 125 MCG PO TABS
125.0000 ug | ORAL_TABLET | Freq: Every day | ORAL | Status: DC
  Administered 2019-03-05 – 2019-03-08 (×3): 125 ug via ORAL
  Filled 2019-03-04: qty 1
  Filled 2019-03-04: qty 3
  Filled 2019-03-04 (×2): qty 1

## 2019-03-04 NOTE — ED Notes (Signed)
Pt ambulatory to toilet to urinate. Pt has steady gait,denied feeling dizzy. Pt then placed on zoll pads.

## 2019-03-04 NOTE — ED Provider Notes (Signed)
[facility] Emergency Department Provider Note  ____________________________________________   First MD Initiated Contact with Patient 03/04/19 1248     (approximate)  I have reviewed the triage vital signs and the nursing notes.   HISTORY  Chief Complaint Fall   HPI Brandy Golden is a 73 y.o. female presents to the ED with injury sustained in a fall yesterday.  Patient states that she tripped over a piece of concrete and fell face first.  She is unaware of any LOC but reports that she did break part of a bridge for her front teeth.  Patient went to [place] [facility] last evening but left without being seen.  Patient has bruising to the right side of her face.  She also complains of low back pain and diffuse soreness over her body.        Past Medical History:  Diagnosis Date   Allergy    Anemia, iron deficiency    Anxiety    Blood transfusion without reported diagnosis    Cancer ([place])    bacal cell nd squmous cell skin cancers   Cataract    Colon polyps    Depression    Diarrhea    Diverticulosis    Dysrhythmia    Fibrocystic Breast disease    pt denies   Gastrointestinal hemorrhage, hx of    GERD (gastroesophageal reflux disease)    History of hiatal hernia    Hx of basal cell carcinoma    and squamous cell skin cancer   Hypertension    IBS (irritable bowel syndrome)    Internal hemorrhoids without mention of complication    LBBB (left bundle branch block)    Unspecified hypothyroidism     Patient Active Problem List   Diagnosis Date Noted   RECTAL BLEEDING 12/02/2009   ENLARGEMENT OF LYMPH NODES 12/02/2009   FLATULENCE-GAS-BLOATING 12/02/2009   PERSONAL HX COLONIC POLYPS 12/02/2009   HIATAL HERNIA 07/08/2009   COLONIC POLYPS 11/26/2008   CARCINOMA, BASAL CELL 11/19/2008   UNSPECIFIED HYPOTHYROIDISM 11/19/2008   ANEMIA, IRON DEFICIENCY 11/19/2008   ANXIETY 11/19/2008   HYPERTENSION 11/19/2008    INTERNAL HEMORRHOIDS 11/19/2008   GERD 11/19/2008   DIVERTICULOSIS, COLON 11/19/2008   IRRITABLE BOWEL SYNDROME 11/19/2008   FIBROCYSTIC BREAST DISEASE 11/19/2008   GASTROINTESTINAL HEMORRHAGE, HX OF 11/19/2008    Past Surgical History:  Procedure Laterality Date   ABDOMINAL HYSTERECTOMY     CATARACT EXTRACTION W/PHACO Left 12/04/2017   Procedure: CATARACT EXTRACTION PHACO AND INTRAOCULAR LENS PLACEMENT ([place]);  Surgeon: Birder Robson, MD;  Location: [facility] ORS;  Service: Ophthalmology;  Laterality: Left;  [place] 01: 13.5 AP% 18.8 CDE 13.85 Fluid Pack lot # [id number]   CATARACT EXTRACTION W/PHACO Right 12/25/2017   Procedure: CATARACT EXTRACTION PHACO AND INTRAOCULAR LENS PLACEMENT (IOC);  Surgeon: Birder Robson, MD;  Location: [facility] ORS;  Service: Ophthalmology;  Laterality: Right;  [place]  00:35 AP% 17.5 CDE 6.23 Fluid pack lot # [id number]   COLONOSCOPY     FOOT SURGERY     HERNIA REPAIR     Laparoscopic repair of large hiatal hernia/nissen  07/11   Dr. Johnathan Hausen   Left foot surgery     nissen fundoplycation     rhinoplasty with submocosal resection     Submucosal resection     THROAT SURGERY     VOCAL CORD   THYROIDECTOMY     UPPER GASTROINTESTINAL ENDOSCOPY     Vocal cord repair      Prior to Admission  medications   Medication Sig Start Date End Date Taking? Authorizing Provider  vitamin C (ASCORBIC ACID) 500 MG tablet Take 500 mg by mouth daily.   Yes [provider]  zinc gluconate 50 MG tablet Take 50 mg by mouth daily.   Yes [provider]  ALPRAZolam Duanne Moron) 0.5 MG tablet Take 0.5 mg by mouth at bedtime as needed for anxiety or sleep.  05/12/13   [provider]  aspirin 81 MG tablet Take 81 mg by mouth at bedtime.     [provider]  Biotin 5 MG CAPS Take 5 mg by mouth 2 (two) times daily.    [provider]  Ca Carbonate-Mag Hydroxide (ROLAIDS PO) Take 2 each by mouth daily as needed (for acid reflux).     [provider]  Calcium Carb-Cholecalciferol (CALCIUM + VITAMIN D3 PO) Take 2 tablets by mouth 2 (two) times daily.    [provider]  cetirizine (ZYRTEC) 10 MG tablet Take 10 mg by mouth daily as needed for allergies.    [provider]  Cholecalciferol (VITAMIN D3) 1000 units CAPS Take 1,000 Units by mouth daily.    [provider]  hyoscyamine (LEVSIN/SL) 0.125 MG SL tablet Place 1-2 tablets (0.125-0.25 mg total) under the tongue every 6 (six) hours as needed. Patient taking differently: Place 0.125-0.25 mg under the tongue every 6 (six) hours as needed for cramping.  08/20/13   Pyrtle, Lajuan Lines, MD  lisinopril (PRINIVIL,ZESTRIL) 10 MG tablet Take 10 mg by mouth daily.  06/05/13   [provider]  Multiple Vitamin (MULTIVITAMIN) tablet Take 1 tablet by mouth daily.    [provider]  omeprazole (PRILOSEC) 20 MG capsule Take 40 mg by mouth 2 (two) times daily.    [provider]  oxymetazoline (AFRIN) 0.05 % nasal spray Place 2 sprays into both nostrils 2 (two) times daily as needed for congestion.    [provider]  sertraline (ZOLOFT) 100 MG tablet Take 100 mg by mouth daily.  06/05/13   [provider]  SYNTHROID 125 MCG tablet Take 125 mcg by mouth daily before breakfast.  04/17/13   [provider]    Allergies Other  Family History  Problem Relation Age of Onset   Diabetes Father        2 aunts, 1 cousin   CVA Father    Heart disease Mother    Breast cancer Sister    Colitis Cousin    Breast cancer Cousin    Breast cancer Maternal Aunt    Colon cancer Neg Hx    Esophageal cancer Neg Hx    Rectal cancer Neg Hx    Stomach cancer Neg Hx     Social History Social History   Tobacco Use   Smoking status: Never Smoker   Smokeless tobacco: Never Used  Substance Use Topics   Alcohol use: Yes    Comment: Occassional wine   Drug use: No    Review of Systems Constitutional:  No fever/chills Eyes: Ecchymosis right eye decreasing vision.  Scratches on glasses. ENT: No sore throat. Cardiovascular: Denies chest pain. Respiratory: Denies shortness of breath. Gastrointestinal: No abdominal pain.  No nausea, no vomiting. Genitourinary: Negative for dysuria. Musculoskeletal: Positive low back pain. Skin: Multiple facial abrasions. Neurological: Negative for headaches, focal weakness or numbness.   ____________________________________________   PHYSICAL EXAM:  VITAL SIGNS: ED Triage Vitals  Enc Vitals Group     BP 03/04/19 1318 (!) 154/45  Pulse Rate 03/04/19 1318 (!) 45     Resp 03/04/19 1318 16     Temp 03/04/19 1318 98 F (36.7 C)     Temp Source 03/04/19 1318 Oral     SpO2 03/04/19 1318 99 %     Weight 03/04/19 1219 208 lb 15.9 oz (94.8 kg)     Height --      Head Circumference --      Peak Flow --      Pain Score 03/04/19 1216 6     Pain Loc --      Pain Edu? --      Excl. in [place]? --    ____________________________________________   LABS (all labs ordered are listed, but only abnormal results are displayed)  Labs Reviewed  CBC WITH DIFFERENTIAL/PLATELET - Abnormal; Notable for the following components:      Result Value   Platelets 144 (*)    All other components within normal limits  COMPREHENSIVE METABOLIC PANEL - Abnormal; Notable for the following components:   Glucose, Bld 135 (*)    BUN 25 (*)    Calcium 8.6 (*)    All other components within normal limits  SARS CORONAVIRUS 2 (TAT 6-24 HRS)  MAGNESIUM  TSH  TROPONIN I (HIGH SENSITIVITY)  TROPONIN I (HIGH SENSITIVITY)   RADIOLOGY   Official radiology report(s): Dg Lumbar Spine 2-3 Views  Result Date: 03/04/2019 CLINICAL DATA:  Fall EXAM: LUMBAR SPINE - 2-3 VIEW COMPARISON:  None. FINDINGS: Mild dextrocurvature. Lumbar lordosis is maintained. No acute fracture identified. Multilevel degenerative changes including disc height loss, endplate osteophytes, and facet  hypertrophy. IMPRESSION: No acute fracture identified.  Multilevel degenerative changes. Electronically Signed   By: Macy Mis M.D.   On: 03/04/2019 13:23   Ct Head Wo Contrast  Result Date: 03/04/2019 CLINICAL DATA:  Facial injury after fall last night. EXAM: CT HEAD WITHOUT CONTRAST CT MAXILLOFACIAL WITHOUT CONTRAST CT CERVICAL SPINE WITHOUT CONTRAST TECHNIQUE: Multidetector CT imaging of the head, cervical spine, and maxillofacial structures were performed using the standard protocol without intravenous contrast. Multiplanar CT image reconstructions of the cervical spine and maxillofacial structures were also generated. COMPARISON:  None. FINDINGS: CT HEAD FINDINGS Brain: Mild chronic ischemic white matter disease is noted. No mass effect or midline shift is noted. Ventricular size is within normal limits. Probable small subdural hematoma is seen involving the para falcine region anteriorly. No mass lesion or acute infarction is noted. Vascular: No hyperdense vessel or unexpected calcification. Skull: Normal. Negative for fracture or focal lesion. Other: Small right frontal scalp hematoma is noted. CT MAXILLOFACIAL FINDINGS Osseous: No fracture or mandibular dislocation. No destructive process. Orbits: Negative. No traumatic or inflammatory finding. Sinuses: Clear. Soft tissues: Right supraorbital soft tissue contusion is noted. CT CERVICAL SPINE FINDINGS Alignment: Normal. Skull base and vertebrae: No acute fracture. No primary bone lesion or focal pathologic process. Soft tissues and spinal canal: No prevertebral fluid or swelling. No visible canal hematoma. Disc levels: Severe degenerative disc disease is noted at C3-4, C4-5, C5-6 and C6-7. Upper chest: Negative. Other: None. IMPRESSION: Probable small anterior parafalcine subdural hematoma is noted. No mass effect or midline shift is noted. Critical Value/emergent results were called by telephone at the time of interpretation on 03/04/2019 at 1:35 pm  to [facility], who verbally acknowledged these results. Small right frontal scalp and supraorbital contusions or hematoma are present. No osseous abnormality is noted in maxillofacial region. Severe multilevel degenerative disc disease is noted in the cervical spine. No  acute fracture or spondylolisthesis is noted. Electronically Signed   By: Marijo Conception M.D.   On: 03/04/2019 13:36   Ct Cervical Spine Wo Contrast  Result Date: 03/04/2019 CLINICAL DATA:  Facial injury after fall last night. EXAM: CT HEAD WITHOUT CONTRAST CT MAXILLOFACIAL WITHOUT CONTRAST CT CERVICAL SPINE WITHOUT CONTRAST TECHNIQUE: Multidetector CT imaging of the head, cervical spine, and maxillofacial structures were performed using the standard protocol without intravenous contrast. Multiplanar CT image reconstructions of the cervical spine and maxillofacial structures were also generated. COMPARISON:  None. FINDINGS: CT HEAD FINDINGS Brain: Mild chronic ischemic white matter disease is noted. No mass effect or midline shift is noted. Ventricular size is within normal limits. Probable small subdural hematoma is seen involving the para falcine region anteriorly. No mass lesion or acute infarction is noted. Vascular: No hyperdense vessel or unexpected calcification. Skull: Normal. Negative for fracture or focal lesion. Other: Small right frontal scalp hematoma is noted. CT MAXILLOFACIAL FINDINGS Osseous: No fracture or mandibular dislocation. No destructive process. Orbits: Negative. No traumatic or inflammatory finding. Sinuses: Clear. Soft tissues: Right supraorbital soft tissue contusion is noted. CT CERVICAL SPINE FINDINGS Alignment: Normal. Skull base and vertebrae: No acute fracture. No primary bone lesion or focal pathologic process. Soft tissues and spinal canal: No prevertebral fluid or swelling. No visible canal hematoma. Disc levels: Severe degenerative disc disease is noted at C3-4, C4-5, C5-6 and C6-7. Upper  chest: Negative. Other: None. IMPRESSION: Probable small anterior parafalcine subdural hematoma is noted. No mass effect or midline shift is noted. Critical Value/emergent results were called by telephone at the time of interpretation on 03/04/2019 at 1:35 pm to [facility], who verbally acknowledged these results. Small right frontal scalp and supraorbital contusions or hematoma are present. No osseous abnormality is noted in maxillofacial region. Severe multilevel degenerative disc disease is noted in the cervical spine. No acute fracture or spondylolisthesis is noted. Electronically Signed   By: Marijo Conception M.D.   On: 03/04/2019 13:36   Ct Maxillofacial Wo Contrast  Result Date: 03/04/2019 CLINICAL DATA:  Facial injury after fall last night. EXAM: CT HEAD WITHOUT CONTRAST CT MAXILLOFACIAL WITHOUT CONTRAST CT CERVICAL SPINE WITHOUT CONTRAST TECHNIQUE: Multidetector CT imaging of the head, cervical spine, and maxillofacial structures were performed using the standard protocol without intravenous contrast. Multiplanar CT image reconstructions of the cervical spine and maxillofacial structures were also generated. COMPARISON:  None. FINDINGS: CT HEAD FINDINGS Brain: Mild chronic ischemic white matter disease is noted. No mass effect or midline shift is noted. Ventricular size is within normal limits. Probable small subdural hematoma is seen involving the para falcine region anteriorly. No mass lesion or acute infarction is noted. Vascular: No hyperdense vessel or unexpected calcification. Skull: Normal. Negative for fracture or focal lesion. Other: Small right frontal scalp hematoma is noted. CT MAXILLOFACIAL FINDINGS Osseous: No fracture or mandibular dislocation. No destructive process. Orbits: Negative. No traumatic or inflammatory finding. Sinuses: Clear. Soft tissues: Right supraorbital soft tissue contusion is noted. CT CERVICAL SPINE FINDINGS Alignment: Normal. Skull base and vertebrae: No  acute fracture. No primary bone lesion or focal pathologic process. Soft tissues and spinal canal: No prevertebral fluid or swelling. No visible canal hematoma. Disc levels: Severe degenerative disc disease is noted at C3-4, C4-5, C5-6 and C6-7. Upper chest: Negative. Other: None. IMPRESSION: Probable small anterior parafalcine subdural hematoma is noted. No mass effect or midline shift is noted. Critical Value/emergent results were called by telephone at the time of interpretation on 03/04/2019 at  1:35 pm to Malon Kindle, who verbally acknowledged these results. Small right frontal scalp and supraorbital contusions or hematoma are present. No osseous abnormality is noted in maxillofacial region. Severe multilevel degenerative disc disease is noted in the cervical spine. No acute fracture or spondylolisthesis is noted. Electronically Signed   By: Marijo Conception M.D.   On: 03/04/2019 13:36    ____________________________________________   PROCEDURES  Procedure(s) performed (including Critical Care):  Procedures   ____________________________________________   INITIAL IMPRESSION / ASSESSMENT AND PLAN / ED COURSE  As part of my medical decision making, I reviewed the following data within the electronic MEDICAL RECORD NUMBER Notes from prior ED visits and [place] Controlled Substance Database  Dr. Ellender Hose was asked to see the patient after vital signs showed a pulse rate of 45 after patient returned from CT scan.  Dr. Ellender Hose then took over the care of the patient. ____________________________________________   FINAL CLINICAL IMPRESSION(S) / ED DIAGNOSES  Final diagnoses:  None     ED Discharge Orders    None       Note:  This document was prepared using Dragon voice recognition software and may include unintentional dictation errors.    Johnn Hai, PA-C 03/04/19 1647    Arta Silence, MD 03/05/19 1221

## 2019-03-04 NOTE — ED Notes (Signed)
Pt provided meal tray

## 2019-03-04 NOTE — ED Notes (Signed)
Cardiology PA at bedside. 

## 2019-03-04 NOTE — Consult Note (Signed)
Cardiology Consultation:   Patient ID: Brandy Golden MRN: [id number]; DOB: Mar 10, 1946  Admit date: 03/04/2019 Date of Consult: 03/04/2019  Primary Care Provider: Shon Baton, MD Primary Cardiologist: [facility], Dr. Saunders Revel rounding  Primary Electrophysiologist:  None    Patient Profile:   Brandy Golden is a 73 y.o. female with no known history of cardiac dz or arrhythmia, RBBB, documented HTN, documented LBBB, family history of early cardiac death as outlined below, excision of right mediastinal / paratracheal mass 08/2013, L medialization laryngoplasty 05/2014, revision of L medialization thyroplasty 07/2016, GERD, IBS, hiatal hernia, nissen fundoplication ([id number]), hysterectomy, fibromyalgia, h/o GIB and transfusion not due to anticoagulation, and who is being seen today for the evaluation of 2:1 heart block / transfer to [facility] for pacemaker evaluation at the request of Dr. Ellender Hose.  History of Present Illness:   Ms. Brandy Golden is a 73 yo female with PMH as above. No reported hx of heart disease or arrhythmia. She reportedly completed a stress test by [facility] in the early 2000s and underwent a heart monitor, both of which were unrevealing per patient and not available on review of EMR. She also reports frequent EKGs at her PCP without significant findings / arrhythmia / CHB; however, these are also not available on review of EMR. She reportes her HR is usual ~80s bpm. She reported a history of RBBB with a documented history of LBBB (media tab). She has a family history of early cardiac death (mother age 38, uncle age 16). She also reports HTN in her mother but denied a personal history of HTN, despite documentation of HTN on review of the media tab.  She denied current or past tobacco or drug use. She has "a sip of wine" once or twice a year.   In September 2020, she and her husband were COVID-19 positive but are not aware of any complications during or after recovery from coronavirus.   She initially  denied any past history of syncope; however, on further questioning, she admitted to "ending up on her back in her closet earlier this year." She was unsure if she lost consciousness. The event was unwitnessed by her husband, who found her later in the closet and still on her back. She denied any CP, palpitations, racing HR, or presyncope symptoms before the event. Also, of note, she has experienced progressive SOB/DOE over the last 6 months and with even minimal activity. She stated she cannot cook dinner without becoming weak, SOB, flushed, and diaphoretic. She also noted progressive dizziness within the last couple of months.  Yesterday, 03/03/2019, the patient was in [place] with her husband to assist with driving him home after his toe surgery. After the surgery, they went back to their car in the parking lot. She walked over to the driver's side of the car, where she tripped over a high curb / median that separated the car spaces in the parking lot. She then fell and struck her forehead on the curb but did not lose consciousness. She did not hit her chest. No reported presyncope, lightheadedness, chest pain, racing HR, palpitations, diaphoresis, or nausea before the fall. She stated several times today that she is certain that she had a mechanical fall, reporting "I hit my foot against the curb and then fell down." She then presented to [facility] ED for evaluation; however, she became anxious regarding possible COVID-19 exposure and went back home before being evaluated that night. The next morning (today, 12/1), she went to the walk-in  clinic and was sent to [facility] ED for further evaluation.   In the ED, she continued to deny chest pain, as well as symptoms of presyncope.  Physical exam was significant for contusions/ecchymosis of the face and right periorbital area with superficial abrasions and LUE ecchymosis as well. CT imagining showed probable small anterior parafalcine subdural hematoma, small  right frontal scalp and supraorbital contusions or hematoma, and severe degenerative disc disease of the cervical spine. No acute fracture was identified.  Labs showed sodium 142, potassium 3.7, glucose 135, creatinine 0.91, BUN 25, magnesium 1.8, albumin 3.9, AST 22, ALT 19, HS troponin 6  7, WBC 6.4, hemoglobin 12.6, plts 144, RBC 4.18, TSH 1.392, COVID-19 results pending. EKG showed 2:1 AV block with junctional escape versus CHB, 42 bpm, right bundle branch block and was without significant ST/T changes.  Heart Pathway Score:     Past Medical History:  Diagnosis Date   Allergy    Anemia, iron deficiency    Anxiety    Blood transfusion without reported diagnosis    Cancer ([place])    bacal cell nd squmous cell skin cancers   Cataract    Colon polyps    Depression    Diarrhea    Diverticulosis    Dysrhythmia    Fibrocystic Breast disease    pt denies   Gastrointestinal hemorrhage, hx of    GERD (gastroesophageal reflux disease)    History of hiatal hernia    Hx of basal cell carcinoma    and squamous cell skin cancer   Hypertension    IBS (irritable bowel syndrome)    Internal hemorrhoids without mention of complication    LBBB (left bundle branch block)    Unspecified hypothyroidism     Past Surgical History:  Procedure Laterality Date   ABDOMINAL HYSTERECTOMY     CATARACT EXTRACTION W/PHACO Left 12/04/2017   Procedure: CATARACT EXTRACTION PHACO AND INTRAOCULAR LENS PLACEMENT ([place]);  Surgeon: Birder Robson, MD;  Location: [facility] ORS;  Service: Ophthalmology;  Laterality: Left;  [place] 01: 13.5 AP% 18.8 CDE 13.85 Fluid Pack lot # [id number]   CATARACT EXTRACTION W/PHACO Right 12/25/2017   Procedure: CATARACT EXTRACTION PHACO AND INTRAOCULAR LENS PLACEMENT ([facility]);  Surgeon: Birder Robson, MD;  Location: [facility] ORS;  Service: Ophthalmology;  Laterality: Right;  [place]  00:35 AP% 17.5 CDE 6.23 Fluid pack lot # [id number]   COLONOSCOPY     FOOT SURGERY      HERNIA REPAIR     Laparoscopic repair of large hiatal hernia/nissen  07/11   Dr. Johnathan Hausen   Left foot surgery     nissen fundoplycation     rhinoplasty with submocosal resection     Submucosal resection     THROAT SURGERY     VOCAL CORD   THYROIDECTOMY     UPPER GASTROINTESTINAL ENDOSCOPY     Vocal cord repair       Home Medications:  Prior to Admission medications   Medication Sig Start Date End Date Taking? Authorizing Provider  ALPRAZolam Duanne Moron) 0.5 MG tablet Take 0.5 mg by mouth 2 (two) times daily as needed for anxiety or sleep.  05/12/13  Yes [provider]  aspirin 81 MG tablet Take 81 mg by mouth at bedtime.    Yes [provider]  Biotin 5 MG CAPS Take 5 mg by mouth 2 (two) times daily.   Yes [provider]  Ca Carbonate-Mag Hydroxide (ROLAIDS PO) Take 2 each by mouth daily as needed (for acid reflux).  Yes [provider]  Calcium Carb-Cholecalciferol (CALCIUM + VITAMIN D3 PO) Take 2 tablets by mouth 2 (two) times daily.   Yes [provider]  Cholecalciferol (VITAMIN D3) 1000 units CAPS Take 1,000 Units by mouth daily.   Yes [provider]  hyoscyamine (LEVSIN SL) 0.125 MG SL tablet Place 0.125-0.25 mg under the tongue daily as needed for cramping.    Yes [provider]  lisinopril (PRINIVIL,ZESTRIL) 10 MG tablet Take 10 mg by mouth daily.  06/05/13  Yes [provider]  omeprazole (PRILOSEC) 20 MG capsule Take 20 mg by mouth 2 (two) times daily before a meal.    Yes [provider]  oxymetazoline (AFRIN) 0.05 % nasal spray Place 2 sprays into both nostrils 2 (two) times daily as needed for congestion.   Yes [provider]  sertraline (ZOLOFT) 100 MG tablet Take 100 mg by mouth daily.  06/05/13  Yes [provider]  SYNTHROID 125 MCG tablet Take 125 mcg by mouth daily before breakfast.  04/17/13  Yes [provider]  vitamin C (ASCORBIC ACID) 500 MG tablet  Take 1,000 mg by mouth daily.    Yes [provider]  zinc gluconate 50 MG tablet Take 50 mg by mouth daily.   Yes [provider]    Inpatient Medications: Scheduled Meds:  Continuous Infusions:  sodium chloride     PRN Meds:   Allergies:    Allergies  Allergen Reactions   Other     MYCINS    Social History:   Social History   Socioeconomic History   Marital status: Married    Spouse name: Not on file   Number of children: 2   Years of education: Not on file   Highest education level: Not on file  Occupational History   Occupation: Retired Scientist, research (physical sciences) strain: Not on file   Food insecurity    Worry: Not on file    Inability: Not on Lexicographer needs    Medical: Not on file    Non-medical: Not on file  Tobacco Use   Smoking status: Never Smoker   Smokeless tobacco: Never Used  Substance and Sexual Activity   Alcohol use: Yes    Comment: Occassional wine   Drug use: No   Sexual activity: Not on file  Lifestyle   Physical activity    Days per week: Not on file    Minutes per session: Not on file   Stress: Not on file  Relationships   Social connections    Talks on phone: Not on file    Gets together: Not on file    Attends religious service: Not on file    Active member of club or organization: Not on file    Attends meetings of clubs or organizations: Not on file    Relationship status: Not on file   Intimate partner violence    Fear of current or ex partner: Not on file    Emotionally abused: Not on file    Physically abused: Not on file    Forced sexual activity: Not on file  Other Topics Concern   Not on file  Social History Narrative   Not on file    Family History:   Early cardiac death in mother, age 52, and uncle, age 24. HTN in mother. Family History  Problem Relation Age of Onset   Diabetes Father        2  aunts, 1 cousin   CVA Father    Heart disease  Mother    Breast cancer Sister    Colitis Cousin    Breast cancer Cousin    Breast cancer Maternal Aunt    Colon cancer Neg Hx    Esophageal cancer Neg Hx    Rectal cancer Neg Hx    Stomach cancer Neg Hx      ROS:  Please see the history of present illness.  Review of Systems  Constitutional: Positive for diaphoresis and malaise/fatigue.       Reports increased fatigue.  With minimal exertion, SOB/DOE and diaphoresis.  Respiratory: Positive for shortness of breath. Negative for cough, hemoptysis and wheezing.        Progressive SOB/DOE with minimal exertion over last 6 months.  Cardiovascular: Negative for chest pain, palpitations, orthopnea and leg swelling.  Gastrointestinal: Negative for blood in stool, diarrhea, melena, nausea and vomiting.  Genitourinary: Negative for hematuria.  Musculoskeletal: Positive for falls.       Fall 11/30  Neurological: Positive for dizziness and weakness. Negative for focal weakness and loss of consciousness.       Progressive dizziness over the last 2 months.  Progressive weakness over the last 6 months. No reported presyncope or CP before fall on 03/03/19. No LOC with fall.  All other systems reviewed and are negative.   All other ROS reviewed and negative.     Physical Exam/Data:   Vitals:   03/04/19 1445 03/04/19 1500 03/04/19 1505 03/04/19 1530  BP:   (!) 133/47 (!) 146/49  Pulse: (!) 43 (!) 42 (!) 42   Resp: 19 18 19  (!) 22  Temp:      TempSrc:      SpO2: 96% 95% 96%   Weight:       No intake or output data in the 24 hours ending 03/04/19 1701 Last 3 Weights 03/04/2019 02/05/2019 01/28/2019  Weight (lbs) 208 lb 15.9 oz 209 lb 209 lb 6.4 oz  Weight (kg) 94.8 kg 94.802 kg 94.983 kg     Body mass index is 30.86 kg/m.  General:  Well nourished, well developed, in no acute distress. Ecchymosis of face and L arm.  HEENT: normal Neck: no JVD Vascular: No carotid bruits; radial pulses 2+ bilaterally Cardiac:  normal S1, S2;  bradycardic; no murmur. Anterior chest not TTP. Pacer pads in place. Lungs:  Poor inspiratory effort, clear to auscultation bilaterally, no wheezing, rhonchi or rales  Abd: soft, nontender, no hepatomegaly  Ext: no significant bilateral lower extremity edema.  Musculoskeletal:  No deformities, BUE and BLE strength normal and equal Skin: warm and dry  Neuro:  No focal abnormalities noted Psych:  Normal affect   EKG:  The EKG was personally reviewed and demonstrates: EKG showed 2:1 AV block with junctional escape versus CHB, 42 bpm, right bundle branch block, no significant ST/T changes. Telemetry:  Telemetry was personally reviewed and demonstrates: Bradycardic rates / AVB with rates 30-40s  Relevant CV Studies: No current studies available  Laboratory Data:  High Sensitivity Troponin:   Recent Labs  Lab 03/04/19 1411 03/04/19 1602  TROPONINIHS 6 7     Cardiac EnzymesNo results for input(s): TROPONINI in the last 168 hours. No results for input(s): TROPIPOC in the last 168 hours.  Chemistry Recent Labs  Lab 03/04/19 1411  NA 142  K 3.7  CL 108  CO2 23  GLUCOSE 135*  BUN 25*  CREATININE 0.91  CALCIUM 8.6*  GFRNONAA >60  GFRAA >60  ANIONGAP 11    Recent Labs  Lab 03/04/19 1411  PROT 6.9  ALBUMIN 3.9  AST 22  ALT 19  ALKPHOS 67  BILITOT 0.9   Hematology Recent Labs  Lab 03/04/19 1411  WBC 6.4  RBC 4.18  HGB 12.6  HCT 38.6  MCV 92.3  MCH 30.1  MCHC 32.6  RDW 13.7  PLT 144*   BNPNo results for input(s): BNP, PROBNP in the last 168 hours.  DDimer No results for input(s): DDIMER in the last 168 hours.   Radiology/Studies:  Dg Lumbar Spine 2-3 Views  Result Date: 03/04/2019 CLINICAL DATA:  Fall EXAM: LUMBAR SPINE - 2-3 VIEW COMPARISON:  None. FINDINGS: Mild dextrocurvature. Lumbar lordosis is maintained. No acute fracture identified. Multilevel degenerative changes including disc height loss, endplate osteophytes, and facet hypertrophy. IMPRESSION: No  acute fracture identified.  Multilevel degenerative changes. Electronically Signed   By: Macy Mis M.D.   On: 03/04/2019 13:23   Ct Head Wo Contrast  Result Date: 03/04/2019 CLINICAL DATA:  Facial injury after fall last night. EXAM: CT HEAD WITHOUT CONTRAST CT MAXILLOFACIAL WITHOUT CONTRAST CT CERVICAL SPINE WITHOUT CONTRAST TECHNIQUE: Multidetector CT imaging of the head, cervical spine, and maxillofacial structures were performed using the standard protocol without intravenous contrast. Multiplanar CT image reconstructions of the cervical spine and maxillofacial structures were also generated. COMPARISON:  None. FINDINGS: CT HEAD FINDINGS Brain: Mild chronic ischemic white matter disease is noted. No mass effect or midline shift is noted. Ventricular size is within normal limits. Probable small subdural hematoma is seen involving the para falcine region anteriorly. No mass lesion or acute infarction is noted. Vascular: No hyperdense vessel or unexpected calcification. Skull: Normal. Negative for fracture or focal lesion. Other: Small right frontal scalp hematoma is noted. CT MAXILLOFACIAL FINDINGS Osseous: No fracture or mandibular dislocation. No destructive process. Orbits: Negative. No traumatic or inflammatory finding. Sinuses: Clear. Soft tissues: Right supraorbital soft tissue contusion is noted. CT CERVICAL SPINE FINDINGS Alignment: Normal. Skull base and vertebrae: No acute fracture. No primary bone lesion or focal pathologic process. Soft tissues and spinal canal: No prevertebral fluid or swelling. No visible canal hematoma. Disc levels: Severe degenerative disc disease is noted at C3-4, C4-5, C5-6 and C6-7. Upper chest: Negative. Other: None. IMPRESSION: Probable small anterior parafalcine subdural hematoma is noted. No mass effect or midline shift is noted. Critical Value/emergent results were called by telephone at the time of interpretation on 03/04/2019 at 1:35 pm to [facility], who verbally acknowledged these results. Small right frontal scalp and supraorbital contusions or hematoma are present. No osseous abnormality is noted in maxillofacial region. Severe multilevel degenerative disc disease is noted in the cervical spine. No acute fracture or spondylolisthesis is noted. Electronically Signed   By: Marijo Conception M.D.   On: 03/04/2019 13:36   Ct Cervical Spine Wo Contrast  Result Date: 03/04/2019 CLINICAL DATA:  Facial injury after fall last night. EXAM: CT HEAD WITHOUT CONTRAST CT MAXILLOFACIAL WITHOUT CONTRAST CT CERVICAL SPINE WITHOUT CONTRAST TECHNIQUE: Multidetector CT imaging of the head, cervical spine, and maxillofacial structures were performed using the standard protocol without intravenous contrast. Multiplanar CT image reconstructions of the cervical spine and maxillofacial structures were also generated. COMPARISON:  None. FINDINGS: CT HEAD FINDINGS Brain: Mild chronic ischemic white matter disease is noted. No mass effect or midline shift is noted. Ventricular size is within normal limits. Probable small subdural hematoma is seen involving the para falcine region anteriorly. No mass lesion  or acute infarction is noted. Vascular: No hyperdense vessel or unexpected calcification. Skull: Normal. Negative for fracture or focal lesion. Other: Small right frontal scalp hematoma is noted. CT MAXILLOFACIAL FINDINGS Osseous: No fracture or mandibular dislocation. No destructive process. Orbits: Negative. No traumatic or inflammatory finding. Sinuses: Clear. Soft tissues: Right supraorbital soft tissue contusion is noted. CT CERVICAL SPINE FINDINGS Alignment: Normal. Skull base and vertebrae: No acute fracture. No primary bone lesion or focal pathologic process. Soft tissues and spinal canal: No prevertebral fluid or swelling. No visible canal hematoma. Disc levels: Severe degenerative disc disease is noted at C3-4, C4-5, C5-6 and C6-7. Upper chest: Negative. Other:  None. IMPRESSION: Probable small anterior parafalcine subdural hematoma is noted. No mass effect or midline shift is noted. Critical Value/emergent results were called by telephone at the time of interpretation on 03/04/2019 at 1:35 pm to [facility], who verbally acknowledged these results. Small right frontal scalp and supraorbital contusions or hematoma are present. No osseous abnormality is noted in maxillofacial region. Severe multilevel degenerative disc disease is noted in the cervical spine. No acute fracture or spondylolisthesis is noted. Electronically Signed   By: Marijo Conception M.D.   On: 03/04/2019 13:36   Ct Maxillofacial Wo Contrast  Result Date: 03/04/2019 CLINICAL DATA:  Facial injury after fall last night. EXAM: CT HEAD WITHOUT CONTRAST CT MAXILLOFACIAL WITHOUT CONTRAST CT CERVICAL SPINE WITHOUT CONTRAST TECHNIQUE: Multidetector CT imaging of the head, cervical spine, and maxillofacial structures were performed using the standard protocol without intravenous contrast. Multiplanar CT image reconstructions of the cervical spine and maxillofacial structures were also generated. COMPARISON:  None. FINDINGS: CT HEAD FINDINGS Brain: Mild chronic ischemic white matter disease is noted. No mass effect or midline shift is noted. Ventricular size is within normal limits. Probable small subdural hematoma is seen involving the para falcine region anteriorly. No mass lesion or acute infarction is noted. Vascular: No hyperdense vessel or unexpected calcification. Skull: Normal. Negative for fracture or focal lesion. Other: Small right frontal scalp hematoma is noted. CT MAXILLOFACIAL FINDINGS Osseous: No fracture or mandibular dislocation. No destructive process. Orbits: Negative. No traumatic or inflammatory finding. Sinuses: Clear. Soft tissues: Right supraorbital soft tissue contusion is noted. CT CERVICAL SPINE FINDINGS Alignment: Normal. Skull base and vertebrae: No acute fracture. No  primary bone lesion or focal pathologic process. Soft tissues and spinal canal: No prevertebral fluid or swelling. No visible canal hematoma. Disc levels: Severe degenerative disc disease is noted at C3-4, C4-5, C5-6 and C6-7. Upper chest: Negative. Other: None. IMPRESSION: Probable small anterior parafalcine subdural hematoma is noted. No mass effect or midline shift is noted. Critical Value/emergent results were called by telephone at the time of interpretation on 03/04/2019 at 1:35 pm to [facility], who verbally acknowledged these results. Small right frontal scalp and supraorbital contusions or hematoma are present. No osseous abnormality is noted in maxillofacial region. Severe multilevel degenerative disc disease is noted in the cervical spine. No acute fracture or spondylolisthesis is noted. Electronically Signed   By: Marijo Conception M.D.   On: 03/04/2019 13:36    Assessment and Plan:   Complete heart block  --1 day s/p patient reported mechanical fall. Denies CP or presyncope before fall as outlined in HPI. Of note, she notes progressive SOB/DOE, weakness, and dizziness over the last months.  --EKG concerning for 2:1 AVB with junctional escape versus CHB. No known previous history of AVB or heart dz; however, patient does have documented history of alternating bundle branch blocks  with known RBBB on EKG and documented LBBB (media tab). No significant ST/T changes on EKG. HS Tn 6  7. Telemetry significant for rates 30-40s. --Transfer pending from [facility] ED to stepdown at [facility] today (12/1) with EP formal evaluation and pacemaker insertion. [facility] and [facility] are aware of this transfer. --Started on heart healthy diet in the [facility] ED with plan to make NPO after midnight tonight for EP evaluation and possible pacemaker insertion tomorrow. --No AV nodal blocking agents.  --No heparin / anticoagulation as below given hematoma on CT.  --Echo pending to evaluate EF  and for acute structural changes or valvular abnormalities. If not obtained at [facility], will need ordered once transferred to [facility].  Mechanical fall --S/p 03/03/19 mechanical fall. No CP/presyncope before fall. Reportedly tripped / hit her foot on a median in a parking lot and fell, hitting her face. No LOC. CT Imaging shows small subdural hematoma as above. No heparin or other anticoagulation / antiplatelets, given her hematoma. Continue to monitor.  HTN --No AV nodal blocking agents, given her CHB as above with need for pacemaker evaluation.   For questions or updates, please contact [place] Please consult [email or web address] for contact info under     Signed, Arvil Chaco, PA-C  03/04/2019 5:01 PM

## 2019-03-04 NOTE — ED Notes (Signed)
Report called to Alexie RN  Pt moved to room 18

## 2019-03-04 NOTE — ED Notes (Signed)
Pt provided lemon swabs and chapstick to wet her dry lips. Husband at bedside. A&O, no distress noted at this time.

## 2019-03-04 NOTE — ED Notes (Signed)
See triage note  Presents s/p fall yesterday  States she tripped over a concrete piece and fell face first  Bruising noted to right eye   Also having lower back and bilateral arm soreness

## 2019-03-04 NOTE — Progress Notes (Signed)
Neurosurgery consulted about patient given history of fall and CT head with small thickening along falx anteriorly. Given the small size, no intervention needed but we would recommend a repeat CT head in AM to fully evaluate for any progression.

## 2019-03-04 NOTE — ED Provider Notes (Addendum)
Medical screening examination/treatment/procedure(s) were conducted as a shared visit with non-physician practitioner(s) and myself.  I personally evaluated the patient during the encounter. Briefly, the patient is a 73 yo F with PMHx .anxiety, depression, HTN, here with fall. Pt fell yesterday at [employer] on the parking dividiers, struck her face. No LOC. Since then, she has had mild headache, occasional dizziness, and felt weak. She did not lose consciousness. See HPI on PA note.  ROS: +weakness, +headache, +wound. Denies any other issues. No dental pain or trauma. No CP, SOB, abd pain, n/v. 12 point ROS o/w negative.  PEx: Contusions and ecchymoses to face, right periorbital area, with superficial abrasions. Midface stable. No hemotympanum. No septal hematoma. No active bleeding. No dental trauma. Heart RRR. Bruising L hand, no bony TTP. Mild swelling L ankle. No hip pain. No other secondary trauma.  MDM: CT imaging shows small anterior subdural hematoma. Nearly 24 hr post injury, not on blood thinners. Discussed with Dr. Lacinda Axon - could repeat scan in another 12-24 hours to determine if safe to start anticoagulation if needed, otherwise she is clear.  Of note, HR 40s in room. EKG shows what seems like intermittent CHB. No h/o same. Labs largely unremarkable. While I don't think this is what triggered her fall, suspect it could be somewhat reactive to her head injury exacerbating an underlying conduction abnormality. Dr. Saunders Revel consulted, will see if we can transfer to [facility] with plan to admit.  Admit to [facility] VSS.  EKG 1: Suspect CHB vs 2:1 2nd degree block with occasional junctional escape, VR 63. QRS widened at 144. No ST elevations.  EKG 2: Likely 2:1 AV block with rate 42, no ischemia noted.     CRITICAL CARE Performed by: Evonnie Pat   Total critical care time: 35 minutes  Critical care time was exclusive of separately billable procedures and treating other patients.  Critical care  was necessary to treat or prevent imminent or life-threatening deterioration.  Critical care was time spent personally by me on the following activities: development of treatment plan with patient and/or surrogate as well as nursing, discussions with consultants, evaluation of patient's response to treatment, examination of patient, obtaining history from patient or surrogate, ordering and performing treatments and interventions, ordering and review of laboratory studies, ordering and review of radiographic studies, pulse oximetry and re-evaluation of patient's condition.   Duffy Bruce, MD 03/04/19 1640    Duffy Bruce, MD 03/04/19 [phone]

## 2019-03-04 NOTE — ED Notes (Signed)
Pt provided meal tray- on phone at this time. NAD noted.

## 2019-03-04 NOTE — ED Notes (Signed)
Pt assisted to toilet 

## 2019-03-04 NOTE — ED Notes (Signed)
Pt assisted to toilet and changed into hospital gown. Cardiac pads applied and bedside cardiac monitor attatched. PA for cardiology in room speaking with  Pt and husband at this time.

## 2019-03-04 NOTE — ED Triage Notes (Addendum)
Fall last night, tripped over concrete parking block and fell forward hitting face.  No LOC.  Initially went to [facility], referred to ED for evaluation.  Right eye eccymosis seen.  Patient is AAOx3.  Skin warm and dry. NAD

## 2019-03-05 ENCOUNTER — Inpatient Hospital Stay (HOSPITAL_COMMUNITY)
Admit: 2019-03-05 | Discharge: 2019-03-05 | Disposition: A | Discharge status: Home / Self Care | Payer: Medicare Other | Attending: Internal Medicine | Admitting: Internal Medicine

## 2019-03-05 ENCOUNTER — Inpatient Hospital Stay: Payer: Medicare Other

## 2019-03-05 DIAGNOSIS — I5032 Chronic diastolic (congestive) heart failure: Secondary | ICD-10-CM | POA: Diagnosis present

## 2019-03-05 DIAGNOSIS — I442 Atrioventricular block, complete: Secondary | ICD-10-CM

## 2019-03-05 DIAGNOSIS — K219 Gastro-esophageal reflux disease without esophagitis: Secondary | ICD-10-CM

## 2019-03-05 DIAGNOSIS — F418 Other specified anxiety disorders: Secondary | ICD-10-CM | POA: Diagnosis present

## 2019-03-05 DIAGNOSIS — S065X9A Traumatic subdural hemorrhage with loss of consciousness of unspecified duration, initial encounter: Secondary | ICD-10-CM

## 2019-03-05 DIAGNOSIS — I459 Conduction disorder, unspecified: Secondary | ICD-10-CM

## 2019-03-05 DIAGNOSIS — W19XXXA Unspecified fall, initial encounter: Secondary | ICD-10-CM

## 2019-03-05 LAB — ECHOCARDIOGRAM COMPLETE: Weight: 3343.94 oz

## 2019-03-05 LAB — SARS CORONAVIRUS 2 (TAT 6-24 HRS): SARS Coronavirus 2: NEGATIVE

## 2019-03-05 MED ORDER — VITAMIN D 25 MCG (1000 UNIT) PO TABS
1000.0000 [IU] | ORAL_TABLET | Freq: Every day | ORAL | Status: DC
  Administered 2019-03-06 – 2019-03-08 (×2): 1000 [IU] via ORAL
  Filled 2019-03-05 (×2): qty 1

## 2019-03-05 MED ORDER — ALPRAZOLAM 0.5 MG PO TABS
0.5000 mg | ORAL_TABLET | Freq: Once | ORAL | Status: AC
  Administered 2019-03-05: 0.5 mg via ORAL

## 2019-03-05 MED ORDER — ALUM & MAG HYDROXIDE-SIMETH 200-200-20 MG/5ML PO SUSP
30.0000 mL | ORAL | Status: DC | PRN
  Administered 2019-03-05 – 2019-03-06 (×4): 30 mL via ORAL
  Filled 2019-03-05 (×4): qty 30

## 2019-03-05 MED ORDER — CALCIUM CARBONATE ANTACID 500 MG PO CHEW
1.0000 | CHEWABLE_TABLET | Freq: Every day | ORAL | Status: DC | PRN
  Administered 2019-03-06 – 2019-03-07 (×2): 200 mg via ORAL
  Filled 2019-03-05 (×4): qty 1

## 2019-03-05 MED ORDER — CALCIUM CARBONATE-VITAMIN D 500-200 MG-UNIT PO TABS
ORAL_TABLET | Freq: Two times a day (BID) | ORAL | Status: DC
  Administered 2019-03-06 – 2019-03-08 (×4): 1 via ORAL
  Filled 2019-03-05 (×5): qty 1

## 2019-03-05 MED ORDER — HYDRALAZINE HCL 25 MG PO TABS
25.0000 mg | ORAL_TABLET | Freq: Three times a day (TID) | ORAL | Status: DC | PRN
  Filled 2019-03-05: qty 1

## 2019-03-05 MED ORDER — BIOTIN 5 MG PO CAPS: 5.0000 mg | ORAL_CAPSULE | Freq: Two times a day (BID) | ORAL | Status: DC

## 2019-03-05 MED ORDER — SODIUM CHLORIDE 0.9 % IV SOLN
Freq: Once | INTRAVENOUS | Status: AC
  Administered 2019-03-05: 14:00:00 via INTRAVENOUS

## 2019-03-05 MED ORDER — ONDANSETRON HCL 4 MG PO TABS: 4.0000 mg | ORAL_TABLET | Freq: Four times a day (QID) | ORAL | Status: DC | PRN

## 2019-03-05 MED ORDER — PERFLUTREN LIPID MICROSPHERE
1.0000 mL | INTRAVENOUS | Status: AC | PRN
  Administered 2019-03-05: 2 mL via INTRAVENOUS
  Filled 2019-03-05: qty 10

## 2019-03-05 MED ORDER — ONDANSETRON HCL 4 MG/2ML IJ SOLN
4.0000 mg | Freq: Four times a day (QID) | INTRAMUSCULAR | Status: DC | PRN
  Filled 2019-03-05: qty 2

## 2019-03-05 MED ORDER — HYOSCYAMINE SULFATE 0.125 MG SL SUBL
0.1250 mg | SUBLINGUAL_TABLET | Freq: Every day | SUBLINGUAL | Status: DC | PRN
  Administered 2019-03-05 – 2019-03-07 (×2): 0.125 mg via SUBLINGUAL
  Filled 2019-03-05 (×6): qty 1

## 2019-03-05 NOTE — ED Notes (Signed)
Patient transported to CT 

## 2019-03-05 NOTE — ED Notes (Signed)
Called [facility] [phone] still waiting for bed assignment

## 2019-03-05 NOTE — ED Notes (Signed)
Covid screen completed yesterday resulted NEGATIVE

## 2019-03-05 NOTE — Progress Notes (Signed)
[place]  Patient has been awaiting transport to [facility] for EP evaluation and PPM placement.  However, she and her husband now wish to remain at [facility] and have device implanted here by Dr. Saralyn Pilar.  I have spoken with Dr. Kerman Passey and informed Dr. Lorinda Creed of this.  We will defer further cardiology evaluation/management to [facility] and sign off at this time.  Nelva Bush, MD [facility] 03/05/19 @ 1:46 PM

## 2019-03-05 NOTE — ED Notes (Signed)
Dietary called for food tray.

## 2019-03-05 NOTE — ED Notes (Signed)
Pt up to the toilet with a steady gait 

## 2019-03-05 NOTE — ED Notes (Signed)
[facility] called for report, as per Md patient no longer wants to be transferred.

## 2019-03-05 NOTE — ED Provider Notes (Signed)
-----------------------------------------   6:08 AM on 03/05/2019 -----------------------------------------   Blood pressure (!) 160/56, pulse (!) 41, temperature 98 F (36.7 C), temperature source Oral, resp. rate 17, weight 94.8 kg, SpO2 95 %.  The patient is sleeping at this time.  There have been no acute events since the last update.  Awaiting transfer to [facility] this morning.   Paulette Blanch, MD 03/05/19 [phone]

## 2019-03-05 NOTE — Progress Notes (Signed)
BP stable.  Awaiting transfer to [facility] for evaluation by EP.  No new recommendations this time.

## 2019-03-05 NOTE — ED Notes (Signed)
Called [facility], spoke to [place] still waiting for bed assignment [phone]

## 2019-03-05 NOTE — ED Notes (Signed)
Assumed care of patient aox4, reports feeling a little anxious requesting a dose of xanax which she takes at home. Patient awaiting transfer for subdural bleed. Vss, s/o at bedside. Will monitor and keep safe.

## 2019-03-05 NOTE — ED Provider Notes (Addendum)
-----------------------------------------   12:42 PM on 03/05/2019 -----------------------------------------  We are still awaiting transfer to [facility].  We have called around 9 AM this morning and again at 12 PM.  They are still not able to give [place] an ETA for when she will be assigned to bed, at this point we are not even sure that this will be today.  Patient has been in our ER for over 24 hours n.p.o.  I will remove the patient's n.p.o. status at this time and allow the patient to eat as it is not clear when the patient will be able to go to [facility] for her procedure.   Harvest Dark, MD 03/05/19 1243  ----------------------------------------- 1:29 PM on 03/05/2019 -----------------------------------------  Dr. Saralyn Pilar had been made aware of the patient through a patient family member.  They had spoken to Dr. Saralyn Pilar and he was willing to perform the pacemaker here if the primary cardiology team was agreeable.  I spoke to Dr. Saunders Revel of cardiology here who was consulting on the patient yesterday, they are agreeable to this plan of care if the patient wishes to stay here they believe it would be reasonable for [place] to take over as consulting cardiologist.  The patient strongly wishes to stay at [facility] for her procedure.  We will admit to the hospitalist with [place] consulting.  We have canceled the [facility] transfer to [facility].    Harvest Dark, MD 03/05/19 1330

## 2019-03-05 NOTE — Progress Notes (Signed)
*  PRELIMINARY RESULTS* Echocardiogram 2D Echocardiogram has been performed.  Wallie Char Harry Bark 03/05/2019, 10:01 AM

## 2019-03-05 NOTE — H&P (Signed)
History and Physical    Brandy Golden [id number] DOB: 1945/10/15 DOA: 03/04/2019  Referring MD/NP/PA:   PCP: Shon Baton, MD   Patient coming from:  The patient is coming from home.  At baseline, pt is independent for most of ADL.        Chief Complaint: fall   HPI: Brandy Golden is a 73 y.o. female with medical history significant of hypertension, GERD, hypothyroidism, depression, anxiety, GI bleeding, diverticulosis, subdural hematoma, dCHF, left bundle blockade, who presents with fall.  Pt states that she fell in the garage on Monday, no loss of consciousness.  Denies unilateral weakness, numbness or tingling in extremities.  No facial droop or slurred speech.  Patient also denies chest pain, shortness of breath.  She states that she has generalized weakness.  She fell forward, striking her face, causing swelling and ecchymosis around right eye and lips. She went to the [facility] emergency department for further evaluation but left before being seen. She subsequently presented to the [facility] emergency department today and was found to be bradycardic with intermittent 2-1 AV block and third-degree AV block.  CT of the head was also notable for a small right frontal subdural hematoma.  Neurosurgery recommended conservative management with repeat CT scan in 12 to 24 hours.  Patient does not have nausea, vomiting, diarrhea or abdominal pain.  No symptoms of UTI.  ED Course: pt was found to have WBC 6.4, negative COVID-19 test, electrolytes renal function okay, temperature normal, soft blood pressure, bradycardia, oxygen sat 91 to 97% on room air. Pt is admitted to [facility] as inpt. Card was consulted -->planning pacemaker placement on 03/06/19.  Review of Systems:   General: no fevers, chills, no body weight gain, has fatigue HEENT: no blurry vision, hearing changes or sore throat Respiratory: no dyspnea, coughing, wheezing CV: no chest pain, no palpitations GI: no nausea, vomiting,  abdominal pain, diarrhea, constipation GU: no dysuria, burning on urination, increased urinary frequency, hematuria  Ext: no leg edema Neuro: no unilateral weakness, numbness, or tingling, no vision change or hearing loss. Has fall. Skin: has swelling and ecchymosis around right eye and lips MSK: No muscle spasm, no deformity, no limitation of range of movement in spin Heme: No easy bruising.  Travel history: No recent long distant travel.  Allergy:  Allergies  Allergen Reactions   Other     MYCINS    Past Medical History:  Diagnosis Date   Allergy    Anemia, iron deficiency    Anxiety    Blood transfusion without reported diagnosis    Cancer ([place])    bacal cell nd squmous cell skin cancers   Cataract    Colon polyps    Depression    Diarrhea    Diverticulosis    Dysrhythmia    Fibrocystic Breast disease    pt denies   Gastrointestinal hemorrhage, hx of    GERD (gastroesophageal reflux disease)    History of hiatal hernia    Hx of basal cell carcinoma    and squamous cell skin cancer   Hypertension    IBS (irritable bowel syndrome)    Internal hemorrhoids without mention of complication    LBBB (left bundle branch block)    Unspecified hypothyroidism     Past Surgical History:  Procedure Laterality Date   ABDOMINAL HYSTERECTOMY     CATARACT EXTRACTION W/PHACO Left 12/04/2017   Procedure: CATARACT EXTRACTION PHACO AND INTRAOCULAR LENS PLACEMENT ([place]);  Surgeon: Birder Robson, MD;  Location: [facility] ORS;  Service: Ophthalmology;  Laterality: Left;  [place] 01: 13.5 AP% 18.8 CDE 13.85 Fluid Pack lot # [id number]   CATARACT EXTRACTION W/PHACO Right 12/25/2017   Procedure: CATARACT EXTRACTION PHACO AND INTRAOCULAR LENS PLACEMENT (IOC);  Surgeon: Birder Robson, MD;  Location: [facility] ORS;  Service: Ophthalmology;  Laterality: Right;  [place]  00:35 AP% 17.5 CDE 6.23 Fluid pack lot # [id number]   COLONOSCOPY     FOOT SURGERY     HERNIA REPAIR      Laparoscopic repair of large hiatal hernia/nissen  07/11   Dr. Johnathan Hausen   Left foot surgery     nissen fundoplycation     rhinoplasty with submocosal resection     Submucosal resection     THROAT SURGERY     VOCAL CORD   THYROIDECTOMY     UPPER GASTROINTESTINAL ENDOSCOPY     Vocal cord repair      Social History:  reports that she has never smoked. She has never used smokeless tobacco. She reports current alcohol use. She reports that she does not use drugs.  Family History:  Family History  Problem Relation Age of Onset   Diabetes Father        2 aunts, 1 cousin   CVA Father    Heart disease Mother    Breast cancer Sister    Colitis Cousin    Breast cancer Cousin    Breast cancer Maternal Aunt    Colon cancer Neg Hx    Esophageal cancer Neg Hx    Rectal cancer Neg Hx    Stomach cancer Neg Hx      Prior to Admission medications   Medication Sig Start Date End Date Taking? Authorizing Provider  ALPRAZolam Duanne Moron) 0.5 MG tablet Take 0.5 mg by mouth 2 (two) times daily as needed for anxiety or sleep.  05/12/13  Yes [provider]  aspirin 81 MG tablet Take 81 mg by mouth at bedtime.    Yes [provider]  Biotin 5 MG CAPS Take 5 mg by mouth 2 (two) times daily.   Yes [provider]  Ca Carbonate-Mag Hydroxide (ROLAIDS PO) Take 2 each by mouth daily as needed (for acid reflux).   Yes [provider]  Calcium Carb-Cholecalciferol (CALCIUM + VITAMIN D3 PO) Take 2 tablets by mouth 2 (two) times daily.   Yes [provider]  Cholecalciferol (VITAMIN D3) 1000 units CAPS Take 1,000 Units by mouth daily.   Yes [provider]  hyoscyamine (LEVSIN SL) 0.125 MG SL tablet Place 0.125-0.25 mg under the tongue daily as needed for cramping.    Yes [provider]  lisinopril (PRINIVIL,ZESTRIL) 10 MG tablet Take 10 mg by mouth daily.  06/05/13  Yes [provider]  omeprazole (PRILOSEC) 20 MG  capsule Take 20 mg by mouth 2 (two) times daily before a meal.    Yes [provider]  oxymetazoline (AFRIN) 0.05 % nasal spray Place 2 sprays into both nostrils 2 (two) times daily as needed for congestion.   Yes [provider]  sertraline (ZOLOFT) 100 MG tablet Take 100 mg by mouth daily.  06/05/13  Yes [provider]  SYNTHROID 125 MCG tablet Take 125 mcg by mouth daily before breakfast.  04/17/13  Yes [provider]  vitamin C (ASCORBIC ACID) 500 MG tablet Take 1,000 mg by mouth daily.    Yes [provider]  zinc gluconate 50 MG tablet Take 50 mg by mouth daily.  Yes [provider]    Physical Exam: Vitals:   03/05/19 1445 03/05/19 1500 03/05/19 1515 03/05/19 1530  BP: (!) 128/113 (!) 150/75 (!) 160/59 (!) 195/66  Pulse: (!) 46 (!) 45 (!) 44 (!) 42  Resp: 16 13 19 20   Temp:      TempSrc:      SpO2: 98% 97% 97% 95%  Weight:       General: Not in acute distress HEENT:       Eyes: PERRL, EOMI, no scleral icterus.       ENT: No discharge from the ears and nose, no pharynx injection, no tonsillar enlargement.        Neck: No JVD, no bruit, no mass felt. Heme: No neck lymph node enlargement. Cardiac: S1/S2, bradycardia, no murmurs, No gallops or rubs. Respiratory: No rales, wheezing, rhonchi or rubs. GI: Soft, nondistended, nontender, no rebound pain, no organomegaly, BS present. GU: No hematuria Ext: No pitting leg edema bilaterally. 2+DP/PT pulse bilaterally. Musculoskeletal: No joint deformities, No joint redness or warmth, no limitation of ROM in spin. Skin: swelling and ecchymosis around right eye and lips Neuro: Alert, oriented X3, cranial nerves II-XII grossly intact, moves all extremities normally Psych: Patient is not psychotic, no suicidal or hemocidal ideation.  Labs on Admission: I have personally reviewed following labs and imaging studies  CBC: Recent Labs  Lab 03/04/19 1411  WBC 6.4  NEUTROABS 4.8  HGB  12.6  HCT 38.6  MCV 92.3  PLT 123456*   Basic Metabolic Panel: Recent Labs  Lab 03/04/19 1404 03/04/19 1411  NA  --  142  K  --  3.7  CL  --  108  CO2  --  23  GLUCOSE  --  135*  BUN  --  25*  CREATININE  --  0.91  CALCIUM  --  8.6*  MG 1.8  --    GFR: Estimated Creatinine Clearance: 65.4 mL/min (by C-G formula based on SCr of 0.91 mg/dL). Liver Function Tests: Recent Labs  Lab 03/04/19 1411  AST 22  ALT 19  ALKPHOS 67  BILITOT 0.9  PROT 6.9  ALBUMIN 3.9   No results for input(s): LIPASE, AMYLASE in the last 168 hours. No results for input(s): AMMONIA in the last 168 hours. Coagulation Profile: No results for input(s): INR, PROTIME in the last 168 hours. Cardiac Enzymes: No results for input(s): CKTOTAL, CKMB, CKMBINDEX, TROPONINI in the last 168 hours. BNP (last 3 results) No results for input(s): PROBNP in the last 8760 hours. HbA1C: No results for input(s): HGBA1C in the last 72 hours. CBG: No results for input(s): GLUCAP in the last 168 hours. Lipid Profile: No results for input(s): CHOL, HDL, LDLCALC, TRIG, CHOLHDL, LDLDIRECT in the last 72 hours. Thyroid Function Tests: Recent Labs    03/04/19 1404  TSH 1.392   Anemia Panel: No results for input(s): VITAMINB12, FOLATE, FERRITIN, TIBC, IRON, RETICCTPCT in the last 72 hours. Urine analysis: No results found for: COLORURINE, APPEARANCEUR, LABSPEC, PHURINE, GLUCOSEU, HGBUR, BILIRUBINUR, KETONESUR, PROTEINUR, UROBILINOGEN, NITRITE, LEUKOCYTESUR Sepsis Labs: @LABRCNTIP (procalcitonin:4,lacticidven:4) ) Recent Results (from the past 240 hour(s))  SARS CORONAVIRUS 2 (TAT 6-24 HRS) Nasopharyngeal Nasopharyngeal Swab     Status: None   Collection Time: 03/04/19  5:33 PM   Specimen: Nasopharyngeal Swab  Result Value Ref Range Status   SARS Coronavirus 2 NEGATIVE NEGATIVE Final    Comment: (NOTE) SARS-CoV-2 target nucleic acids are NOT DETECTED. The SARS-CoV-2 RNA is generally detectable in upper and  lower respiratory  specimens during the acute phase of infection. Negative results do not preclude SARS-CoV-2 infection, do not rule out co-infections with other pathogens, and should not be used as the sole basis for treatment or other patient management decisions. Negative results must be combined with clinical observations, patient history, and epidemiological information. The expected result is Negative. Fact Sheet for Patients: SugarRoll.be Fact Sheet for Healthcare Providers: https://www.woods-mathews.com/ This test is not yet approved or cleared by the [place] FDA and  has been authorized for detection and/or diagnosis of SARS-CoV-2 by FDA under an Emergency Use Authorization (EUA). This EUA will remain  in effect (meaning this test can be used) for the duration of the COVID-19 declaration under Section 56 4(b)(1) of the Act, 21 U.S.C. section 360bbb-3(b)(1), unless the authorization is terminated or revoked sooner. Performed at [facility], [place] [address], [place], [place] [address]      Radiological Exams on Admission: Dg Lumbar Spine 2-3 Views  Result Date: 03/04/2019 CLINICAL DATA:  Fall EXAM: LUMBAR SPINE - 2-3 VIEW COMPARISON:  None. FINDINGS: Mild dextrocurvature. Lumbar lordosis is maintained. No acute fracture identified. Multilevel degenerative changes including disc height loss, endplate osteophytes, and facet hypertrophy. IMPRESSION: No acute fracture identified.  Multilevel degenerative changes. Electronically Signed   By: Macy Mis M.D.   On: 03/04/2019 13:23   Ct Head Wo Contrast  Result Date: 03/05/2019 CLINICAL DATA:  Probable subdural hematoma. Follow-up. EXAM: CT HEAD WITHOUT CONTRAST TECHNIQUE: Contiguous axial images were obtained from the base of the skull through the vertex without intravenous contrast. COMPARISON:  CT head without contrast 03/04/2019 FINDINGS: Brain: Previously noted prominence the  anterior falx has improved, suggesting resolving small hemorrhage. No new hemorrhage is present. A remote lacunar infarct of the right external capsule is noted. No other focal lesions are evident. The brainstem and cerebellum are within normal limits. The ventricles are of normal size. Vascular: The minimal atherosclerotic changes are noted within the cavernous internal carotid arteries. There is no hyperdense vessel. Skull: Calvarium is intact. No focal lytic or blastic lesions are present. Right supraorbital soft tissue swelling is again noted. No foreign body is present. Sinuses/Orbits: The paranasal sinuses and mastoid air cells are clear. The globes and orbits are within normal limits. IMPRESSION: 1. Resolving anterior parafalcine hemorrhage. 2. No new hemorrhage. 3. Right supraorbital soft tissue swelling without an underlying fracture. 4. Remote lacunar infarct of the right external capsule. Electronically Signed   By: San Morelle M.D.   On: 03/05/2019 05:59   Ct Head Wo Contrast  Result Date: 03/04/2019 CLINICAL DATA:  Facial injury after fall last night. EXAM: CT HEAD WITHOUT CONTRAST CT MAXILLOFACIAL WITHOUT CONTRAST CT CERVICAL SPINE WITHOUT CONTRAST TECHNIQUE: Multidetector CT imaging of the head, cervical spine, and maxillofacial structures were performed using the standard protocol without intravenous contrast. Multiplanar CT image reconstructions of the cervical spine and maxillofacial structures were also generated. COMPARISON:  None. FINDINGS: CT HEAD FINDINGS Brain: Mild chronic ischemic white matter disease is noted. No mass effect or midline shift is noted. Ventricular size is within normal limits. Probable small subdural hematoma is seen involving the para falcine region anteriorly. No mass lesion or acute infarction is noted. Vascular: No hyperdense vessel or unexpected calcification. Skull: Normal. Negative for fracture or focal lesion. Other: Small right frontal scalp hematoma  is noted. CT MAXILLOFACIAL FINDINGS Osseous: No fracture or mandibular dislocation. No destructive process. Orbits: Negative. No traumatic or inflammatory finding. Sinuses: Clear. Soft tissues: Right supraorbital soft tissue contusion  is noted. CT CERVICAL SPINE FINDINGS Alignment: Normal. Skull base and vertebrae: No acute fracture. No primary bone lesion or focal pathologic process. Soft tissues and spinal canal: No prevertebral fluid or swelling. No visible canal hematoma. Disc levels: Severe degenerative disc disease is noted at C3-4, C4-5, C5-6 and C6-7. Upper chest: Negative. Other: None. IMPRESSION: Probable small anterior parafalcine subdural hematoma is noted. No mass effect or midline shift is noted. Critical Value/emergent results were called by telephone at the time of interpretation on 03/04/2019 at 1:35 pm to [facility], who verbally acknowledged these results. Small right frontal scalp and supraorbital contusions or hematoma are present. No osseous abnormality is noted in maxillofacial region. Severe multilevel degenerative disc disease is noted in the cervical spine. No acute fracture or spondylolisthesis is noted. Electronically Signed   By: Marijo Conception M.D.   On: 03/04/2019 13:36   Ct Cervical Spine Wo Contrast  Result Date: 03/04/2019 CLINICAL DATA:  Facial injury after fall last night. EXAM: CT HEAD WITHOUT CONTRAST CT MAXILLOFACIAL WITHOUT CONTRAST CT CERVICAL SPINE WITHOUT CONTRAST TECHNIQUE: Multidetector CT imaging of the head, cervical spine, and maxillofacial structures were performed using the standard protocol without intravenous contrast. Multiplanar CT image reconstructions of the cervical spine and maxillofacial structures were also generated. COMPARISON:  None. FINDINGS: CT HEAD FINDINGS Brain: Mild chronic ischemic white matter disease is noted. No mass effect or midline shift is noted. Ventricular size is within normal limits. Probable small subdural hematoma  is seen involving the para falcine region anteriorly. No mass lesion or acute infarction is noted. Vascular: No hyperdense vessel or unexpected calcification. Skull: Normal. Negative for fracture or focal lesion. Other: Small right frontal scalp hematoma is noted. CT MAXILLOFACIAL FINDINGS Osseous: No fracture or mandibular dislocation. No destructive process. Orbits: Negative. No traumatic or inflammatory finding. Sinuses: Clear. Soft tissues: Right supraorbital soft tissue contusion is noted. CT CERVICAL SPINE FINDINGS Alignment: Normal. Skull base and vertebrae: No acute fracture. No primary bone lesion or focal pathologic process. Soft tissues and spinal canal: No prevertebral fluid or swelling. No visible canal hematoma. Disc levels: Severe degenerative disc disease is noted at C3-4, C4-5, C5-6 and C6-7. Upper chest: Negative. Other: None. IMPRESSION: Probable small anterior parafalcine subdural hematoma is noted. No mass effect or midline shift is noted. Critical Value/emergent results were called by telephone at the time of interpretation on 03/04/2019 at 1:35 pm to [facility], who verbally acknowledged these results. Small right frontal scalp and supraorbital contusions or hematoma are present. No osseous abnormality is noted in maxillofacial region. Severe multilevel degenerative disc disease is noted in the cervical spine. No acute fracture or spondylolisthesis is noted. Electronically Signed   By: Marijo Conception M.D.   On: 03/04/2019 13:36   Ct Maxillofacial Wo Contrast  Result Date: 03/04/2019 CLINICAL DATA:  Facial injury after fall last night. EXAM: CT HEAD WITHOUT CONTRAST CT MAXILLOFACIAL WITHOUT CONTRAST CT CERVICAL SPINE WITHOUT CONTRAST TECHNIQUE: Multidetector CT imaging of the head, cervical spine, and maxillofacial structures were performed using the standard protocol without intravenous contrast. Multiplanar CT image reconstructions of the cervical spine and maxillofacial  structures were also generated. COMPARISON:  None. FINDINGS: CT HEAD FINDINGS Brain: Mild chronic ischemic white matter disease is noted. No mass effect or midline shift is noted. Ventricular size is within normal limits. Probable small subdural hematoma is seen involving the para falcine region anteriorly. No mass lesion or acute infarction is noted. Vascular: No hyperdense vessel or unexpected calcification. Skull: Normal. Negative  for fracture or focal lesion. Other: Small right frontal scalp hematoma is noted. CT MAXILLOFACIAL FINDINGS Osseous: No fracture or mandibular dislocation. No destructive process. Orbits: Negative. No traumatic or inflammatory finding. Sinuses: Clear. Soft tissues: Right supraorbital soft tissue contusion is noted. CT CERVICAL SPINE FINDINGS Alignment: Normal. Skull base and vertebrae: No acute fracture. No primary bone lesion or focal pathologic process. Soft tissues and spinal canal: No prevertebral fluid or swelling. No visible canal hematoma. Disc levels: Severe degenerative disc disease is noted at C3-4, C4-5, C5-6 and C6-7. Upper chest: Negative. Other: None. IMPRESSION: Probable small anterior parafalcine subdural hematoma is noted. No mass effect or midline shift is noted. Critical Value/emergent results were called by telephone at the time of interpretation on 03/04/2019 at 1:35 pm to [facility], who verbally acknowledged these results. Small right frontal scalp and supraorbital contusions or hematoma are present. No osseous abnormality is noted in maxillofacial region. Severe multilevel degenerative disc disease is noted in the cervical spine. No acute fracture or spondylolisthesis is noted. Electronically Signed   By: Marijo Conception M.D.   On: 03/04/2019 13:36     EKG: Independently reviewed.  2-1 AV block heart rate, RBBB, LAE, QTc 417.   Assessment/Plan Principal Problem:   Complete heart block (HCC) Active Problems:   Hypothyroidism   Essential  hypertension   GERD   Heart block   Subdural hematoma (HCC)   Depression with anxiety   Chronic diastolic CHF (congestive heart failure) ([place])   Fall   Complete heart block ([place]): Currently HR is at 40-50s. Stable. Dr. Lorinda Creed is planning to place pacemaker tomorrow. -admit to [facility] as inpt -Need pacemaker at bedside -tele monitorng  Hypothyroidism: TSH 1.392 today -synthroid  Essential hypertension: -hold lisinopril due to soft bp -prn Hydralazine  GERD: -PPI  Subdural hematoma ([place]): the repeated CT- head showed resolving anterior parafalcine hemorrhage. -hold ASA  Depression with anxiety: -continue home meds  Chronic diastolic CHF (congestive heart failure) ([place]):  Stable. 2d echo on 03/05/19 showed EF 60-65%. No SOB or signs of CHF exacerbation -check BNP  Fall: Maybe due to A-V block. -PT/OT    Inpatient status:  # Patient requires inpatient status due to high intensity of service, high risk for further deterioration and high frequency of surveillance required.  I certify that at the point of admission it is my clinical judgment that the patient will require inpatient hospital care spanning beyond 2 midnights from the point of admission.   This patient has multiple chronic comorbidities including  hypertension, GERD, hypothyroidism, depression, anxiety, GI bleeding, diverticulosis, subdural hematoma, dCHF, left bundle blockade  Now patient has presenting with Complete heart block, fall and SDH  The worrisome physical exam findings include bradycardia  The initial radiographic and laboratory data are worrisome because of complete A-V block on EKG  Current medical needs: please see my assessment and plan  Predictability of an adverse outcome (risk): pt has complicated medical hx as listed above. Now presents with complete heart block, fall and SDH. She will need pacemaker placement. Her presentation is highly complicated. She is at high risk of deteriorating.  Will need to be treated in hospital for at least two days.        DVT ppx: SCD Code Status: Full code Family Communication:   Yes, patient's husband  at bed side Disposition Plan:  Anticipate discharge back to previous home environment Consults called: Cardiolology Admission status:   SDU/inpation       Date of Service  03/05/2019    Ivor Costa [facility]   If 7PM-7AM, please contact night-coverage [email or web address] Password [email or web address] 03/05/2019, 6:18 PM

## 2019-03-05 NOTE — Progress Notes (Signed)
PHARMACIST - PHYSICIAN ORDER COMMUNICATION  CONCERNING: P&T Medication Policy on Herbal Medications  DESCRIPTION:  This patient's order for:  Biotin CAPS   has been noted.  This product(s) is classified as an "herbal" or natural product. Due to a lack of definitive safety studies or FDA approval, nonstandard manufacturing practices, plus the potential risk of unknown drug-drug interactions while on inpatient medications, the Pharmacy and Therapeutics Committee does not permit the use of "herbal" or natural products of this type within [facility].   ACTION TAKEN: The pharmacy department is unable to verify this order at this time.  Please reevaluate patient's clinical condition at discharge and address if the herbal or natural product(s) should be resumed at that time.  Pernell Dupre, PharmD, BCPS Clinical Pharmacist 03/05/2019 9:50 PM

## 2019-03-05 NOTE — Progress Notes (Signed)
[facility] Cardiology  SUBJECTIVE: Patient laying in bed, denies chest pain or shortness of breath   Vitals:   03/05/19 1145 03/05/19 1200 03/05/19 1215 03/05/19 1245  BP: (!) 153/52 (!) 159/77 (!) 132/94 97/79  Pulse: (!) 39 (!) 42 (!) 42 (!) 40  Resp: 18 19 18 12   Temp:      TempSrc:      SpO2: 92% 97% 96% 97%  Weight:        No intake or output data in the 24 hours ending 03/05/19 1413    PHYSICAL EXAM  General: Well developed, well nourished, in no acute distress HEENT:  Normocephalic and atramatic Neck:  No JVD.  Lungs: Clear bilaterally to auscultation and percussion. Heart: HRRR . Normal S1 and S2 without gallops or murmurs.  Abdomen: Bowel sounds are positive, abdomen soft and non-tender  Msk:  Back normal, normal gait. Normal strength and tone for age. Extremities: No clubbing, cyanosis or edema.   Neuro: Alert and oriented X 3. Psych:  Good affect, responds appropriately   LABS: Basic Metabolic Panel: Recent Labs    03/04/19 1404 03/04/19 1411  NA  --  142  K  --  3.7  CL  --  108  CO2  --  23  GLUCOSE  --  135*  BUN  --  25*  CREATININE  --  0.91  CALCIUM  --  8.6*  MG 1.8  --    Liver Function Tests: Recent Labs    03/04/19 1411  AST 22  ALT 19  ALKPHOS 67  BILITOT 0.9  PROT 6.9  ALBUMIN 3.9   No results for input(s): LIPASE, AMYLASE in the last 72 hours. CBC: Recent Labs    03/04/19 1411  WBC 6.4  NEUTROABS 4.8  HGB 12.6  HCT 38.6  MCV 92.3  PLT 144*   Cardiac Enzymes: No results for input(s): CKTOTAL, CKMB, CKMBINDEX, TROPONINI in the last 72 hours. BNP: Invalid input(s): POCBNP D-Dimer: No results for input(s): DDIMER in the last 72 hours. Hemoglobin A1C: No results for input(s): HGBA1C in the last 72 hours. Fasting Lipid Panel: No results for input(s): CHOL, HDL, LDLCALC, TRIG, CHOLHDL, LDLDIRECT in the last 72 hours. Thyroid Function Tests: Recent Labs    03/04/19 1404  TSH 1.392   Anemia Panel: No results for  input(s): VITAMINB12, FOLATE, FERRITIN, TIBC, IRON, RETICCTPCT in the last 72 hours.  Dg Lumbar Spine 2-3 Views  Result Date: 03/04/2019 CLINICAL DATA:  Fall EXAM: LUMBAR SPINE - 2-3 VIEW COMPARISON:  None. FINDINGS: Mild dextrocurvature. Lumbar lordosis is maintained. No acute fracture identified. Multilevel degenerative changes including disc height loss, endplate osteophytes, and facet hypertrophy. IMPRESSION: No acute fracture identified.  Multilevel degenerative changes. Electronically Signed   By: Macy Mis M.D.   On: 03/04/2019 13:23   Ct Head Wo Contrast  Result Date: 03/05/2019 CLINICAL DATA:  Probable subdural hematoma. Follow-up. EXAM: CT HEAD WITHOUT CONTRAST TECHNIQUE: Contiguous axial images were obtained from the base of the skull through the vertex without intravenous contrast. COMPARISON:  CT head without contrast 03/04/2019 FINDINGS: Brain: Previously noted prominence the anterior falx has improved, suggesting resolving small hemorrhage. No new hemorrhage is present. A remote lacunar infarct of the right external capsule is noted. No other focal lesions are evident. The brainstem and cerebellum are within normal limits. The ventricles are of normal size. Vascular: The minimal atherosclerotic changes are noted within the cavernous internal carotid arteries. There is no hyperdense vessel. Skull: Calvarium is intact. No  focal lytic or blastic lesions are present. Right supraorbital soft tissue swelling is again noted. No foreign body is present. Sinuses/Orbits: The paranasal sinuses and mastoid air cells are clear. The globes and orbits are within normal limits. IMPRESSION: 1. Resolving anterior parafalcine hemorrhage. 2. No new hemorrhage. 3. Right supraorbital soft tissue swelling without an underlying fracture. 4. Remote lacunar infarct of the right external capsule. Electronically Signed   By: San Morelle M.D.   On: 03/05/2019 05:59   Ct Head Wo Contrast  Result Date:  03/04/2019 CLINICAL DATA:  Facial injury after fall last night. EXAM: CT HEAD WITHOUT CONTRAST CT MAXILLOFACIAL WITHOUT CONTRAST CT CERVICAL SPINE WITHOUT CONTRAST TECHNIQUE: Multidetector CT imaging of the head, cervical spine, and maxillofacial structures were performed using the standard protocol without intravenous contrast. Multiplanar CT image reconstructions of the cervical spine and maxillofacial structures were also generated. COMPARISON:  None. FINDINGS: CT HEAD FINDINGS Brain: Mild chronic ischemic white matter disease is noted. No mass effect or midline shift is noted. Ventricular size is within normal limits. Probable small subdural hematoma is seen involving the para falcine region anteriorly. No mass lesion or acute infarction is noted. Vascular: No hyperdense vessel or unexpected calcification. Skull: Normal. Negative for fracture or focal lesion. Other: Small right frontal scalp hematoma is noted. CT MAXILLOFACIAL FINDINGS Osseous: No fracture or mandibular dislocation. No destructive process. Orbits: Negative. No traumatic or inflammatory finding. Sinuses: Clear. Soft tissues: Right supraorbital soft tissue contusion is noted. CT CERVICAL SPINE FINDINGS Alignment: Normal. Skull base and vertebrae: No acute fracture. No primary bone lesion or focal pathologic process. Soft tissues and spinal canal: No prevertebral fluid or swelling. No visible canal hematoma. Disc levels: Severe degenerative disc disease is noted at C3-4, C4-5, C5-6 and C6-7. Upper chest: Negative. Other: None. IMPRESSION: Probable small anterior parafalcine subdural hematoma is noted. No mass effect or midline shift is noted. Critical Value/emergent results were called by telephone at the time of interpretation on 03/04/2019 at 1:35 pm to [facility], who verbally acknowledged these results. Small right frontal scalp and supraorbital contusions or hematoma are present. No osseous abnormality is noted in maxillofacial  region. Severe multilevel degenerative disc disease is noted in the cervical spine. No acute fracture or spondylolisthesis is noted. Electronically Signed   By: Marijo Conception M.D.   On: 03/04/2019 13:36   Ct Cervical Spine Wo Contrast  Result Date: 03/04/2019 CLINICAL DATA:  Facial injury after fall last night. EXAM: CT HEAD WITHOUT CONTRAST CT MAXILLOFACIAL WITHOUT CONTRAST CT CERVICAL SPINE WITHOUT CONTRAST TECHNIQUE: Multidetector CT imaging of the head, cervical spine, and maxillofacial structures were performed using the standard protocol without intravenous contrast. Multiplanar CT image reconstructions of the cervical spine and maxillofacial structures were also generated. COMPARISON:  None. FINDINGS: CT HEAD FINDINGS Brain: Mild chronic ischemic white matter disease is noted. No mass effect or midline shift is noted. Ventricular size is within normal limits. Probable small subdural hematoma is seen involving the para falcine region anteriorly. No mass lesion or acute infarction is noted. Vascular: No hyperdense vessel or unexpected calcification. Skull: Normal. Negative for fracture or focal lesion. Other: Small right frontal scalp hematoma is noted. CT MAXILLOFACIAL FINDINGS Osseous: No fracture or mandibular dislocation. No destructive process. Orbits: Negative. No traumatic or inflammatory finding. Sinuses: Clear. Soft tissues: Right supraorbital soft tissue contusion is noted. CT CERVICAL SPINE FINDINGS Alignment: Normal. Skull base and vertebrae: No acute fracture. No primary bone lesion or focal pathologic process. Soft tissues and spinal canal:  No prevertebral fluid or swelling. No visible canal hematoma. Disc levels: Severe degenerative disc disease is noted at C3-4, C4-5, C5-6 and C6-7. Upper chest: Negative. Other: None. IMPRESSION: Probable small anterior parafalcine subdural hematoma is noted. No mass effect or midline shift is noted. Critical Value/emergent results were called by telephone  at the time of interpretation on 03/04/2019 at 1:35 pm to [facility], who verbally acknowledged these results. Small right frontal scalp and supraorbital contusions or hematoma are present. No osseous abnormality is noted in maxillofacial region. Severe multilevel degenerative disc disease is noted in the cervical spine. No acute fracture or spondylolisthesis is noted. Electronically Signed   By: Marijo Conception M.D.   On: 03/04/2019 13:36   Ct Maxillofacial Wo Contrast  Result Date: 03/04/2019 CLINICAL DATA:  Facial injury after fall last night. EXAM: CT HEAD WITHOUT CONTRAST CT MAXILLOFACIAL WITHOUT CONTRAST CT CERVICAL SPINE WITHOUT CONTRAST TECHNIQUE: Multidetector CT imaging of the head, cervical spine, and maxillofacial structures were performed using the standard protocol without intravenous contrast. Multiplanar CT image reconstructions of the cervical spine and maxillofacial structures were also generated. COMPARISON:  None. FINDINGS: CT HEAD FINDINGS Brain: Mild chronic ischemic white matter disease is noted. No mass effect or midline shift is noted. Ventricular size is within normal limits. Probable small subdural hematoma is seen involving the para falcine region anteriorly. No mass lesion or acute infarction is noted. Vascular: No hyperdense vessel or unexpected calcification. Skull: Normal. Negative for fracture or focal lesion. Other: Small right frontal scalp hematoma is noted. CT MAXILLOFACIAL FINDINGS Osseous: No fracture or mandibular dislocation. No destructive process. Orbits: Negative. No traumatic or inflammatory finding. Sinuses: Clear. Soft tissues: Right supraorbital soft tissue contusion is noted. CT CERVICAL SPINE FINDINGS Alignment: Normal. Skull base and vertebrae: No acute fracture. No primary bone lesion or focal pathologic process. Soft tissues and spinal canal: No prevertebral fluid or swelling. No visible canal hematoma. Disc levels: Severe degenerative disc  disease is noted at C3-4, C4-5, C5-6 and C6-7. Upper chest: Negative. Other: None. IMPRESSION: Probable small anterior parafalcine subdural hematoma is noted. No mass effect or midline shift is noted. Critical Value/emergent results were called by telephone at the time of interpretation on 03/04/2019 at 1:35 pm to [facility], who verbally acknowledged these results. Small right frontal scalp and supraorbital contusions or hematoma are present. No osseous abnormality is noted in maxillofacial region. Severe multilevel degenerative disc disease is noted in the cervical spine. No acute fracture or spondylolisthesis is noted. Electronically Signed   By: Marijo Conception M.D.   On: 03/04/2019 13:36     Echo LVEF 60 to 65%  TELEMETRY: 2-1 AV block:  ASSESSMENT AND PLAN:  Principal Problem:   Complete heart block (HCC) Active Problems:   Hypothyroidism   Essential hypertension   GERD   Heart block   Subdural hematoma (HCC)   Depression with anxiety   Chronic diastolic CHF (congestive heart failure) ([place])   Fall    1.  2-1 AV block heart rate in the 40s, hemodynamically stable, of unknown duration 2.  Mechanical fall, without syncope 3.  Essential hypertension  Recommendations  1.  Patient scheduled for Micra AV leadless pacemaker 03/06/2019.  The risk, benefits and alternatives of Micra AV leadless pacemaker were explained to the patient and informed consent was obtained.   Isaias Cowman, MD, PhD, [facility] 03/05/2019 2:13 PM

## 2019-03-05 NOTE — ED Notes (Signed)
Awaiting soft diet from dietary.

## 2019-03-06 DIAGNOSIS — F418 Other specified anxiety disorders: Secondary | ICD-10-CM

## 2019-03-06 DIAGNOSIS — I442 Atrioventricular block, complete: Principal | ICD-10-CM

## 2019-03-06 DIAGNOSIS — I5032 Chronic diastolic (congestive) heart failure: Secondary | ICD-10-CM

## 2019-03-06 DIAGNOSIS — I10 Essential (primary) hypertension: Secondary | ICD-10-CM

## 2019-03-06 LAB — BASIC METABOLIC PANEL
Anion gap: 8 (ref 5–15)
BUN: 16 mg/dL (ref 8–23)
CO2: 24 mmol/L (ref 22–32)
Calcium: 8.3 mg/dL — ABNORMAL LOW (ref 8.9–10.3)
Chloride: 106 mmol/L (ref 98–111)
Creatinine, Ser: 0.75 mg/dL (ref 0.44–1.00)
GFR calc Af Amer: >60 mL/min (ref >60)
GFR calc non Af Amer: >60 mL/min (ref >60)
Glucose, Bld: 113 mg/dL — ABNORMAL HIGH (ref 70–99)
Potassium: 4.1 mmol/L (ref 3.5–5.1)
Sodium: 138 mmol/L (ref 135–145)

## 2019-03-06 LAB — CBC
HCT: 36.7 % (ref 36.0–46.0)
Hemoglobin: 11.8 g/dL — ABNORMAL LOW (ref 12.0–15.0)
MCH: 29.3 pg (ref 26.0–34.0)
MCHC: 32.2 g/dL (ref 30.0–36.0)
MCV: 91.1 fL (ref 80.0–100.0)
Platelets: 134 10*3/uL — ABNORMAL LOW (ref 150–400)
RBC: 4.03 MIL/uL (ref 3.87–5.11)
RDW: 13.5 % (ref 11.5–15.5)
WBC: 5.7 10*3/uL (ref 4.0–10.5)
nRBC: 0 % (ref 0.0–0.2)

## 2019-03-06 LAB — GLUCOSE, CAPILLARY: Glucose-Capillary: 114 mg/dL — ABNORMAL HIGH (ref 70–99)

## 2019-03-06 LAB — SURGICAL PCR SCREEN
MRSA, PCR: NEGATIVE
Staphylococcus aureus: NEGATIVE

## 2019-03-06 MED ORDER — SODIUM CHLORIDE 0.9 % IV SOLN
INTRAVENOUS | Status: DC
  Administered 2019-03-06: 08:00:00 via INTRAVENOUS

## 2019-03-06 MED ORDER — SODIUM CHLORIDE 0.9 % IV SOLN
INTRAVENOUS | Status: DC
  Administered 2019-03-07: 06:00:00 via INTRAVENOUS

## 2019-03-06 MED ORDER — CEFAZOLIN SODIUM-DEXTROSE 2-4 GM/100ML-% IV SOLN
2.0000 g | INTRAVENOUS | Status: AC
  Administered 2019-03-07: 2 g via INTRAVENOUS
  Filled 2019-03-06: qty 100

## 2019-03-06 MED ORDER — YOU HAVE A PACEMAKER BOOK: Freq: Once | Status: DC

## 2019-03-06 MED ORDER — SODIUM CHLORIDE 0.45 % IV SOLN: INTRAVENOUS | Status: DC

## 2019-03-06 MED ORDER — CEFAZOLIN SODIUM-DEXTROSE 2-4 GM/100ML-% IV SOLN
2.0000 g | INTRAVENOUS | Status: DC
  Filled 2019-03-06: qty 100

## 2019-03-06 MED ORDER — CEFAZOLIN SODIUM-DEXTROSE 2-4 GM/100ML-% IV SOLN: 2.0000 g | INTRAVENOUS | Status: DC

## 2019-03-06 MED ORDER — SODIUM CHLORIDE 0.9 % IV SOLN
INTRAVENOUS | Status: DC
  Administered 2019-03-06 – 2019-03-07 (×2): via INTRAVENOUS

## 2019-03-06 MED ORDER — SODIUM CHLORIDE 0.9 % IV SOLN
80.0000 mg | INTRAVENOUS | Status: AC
  Filled 2019-03-06: qty 2

## 2019-03-06 NOTE — Progress Notes (Signed)
PROGRESS NOTE    Brandy Golden  [id number] DOB: 1945/12/29 DOA: 03/04/2019  PCP: Shon Baton, MD    LOS - 2   Brief Narrative:  73 y.o. female with medical history significant of hypertension, GERD, hypothyroidism, depression, anxiety, GI bleeding, diverticulosis, subdural hematoma, dCHF, left bundle blockade, who presents with fall and generalized weakness.  She fell in her garage on Monday, striking her face, has right eye and lip swelling and ecchymosis.  Went to [facility] ED at the time, but left before being seen.  In the ED here, patient found to be bradycardic with intermittent 2-1 AV block and 3rd degree block.  CT head with small right frontal subdural hematoma, for which neurosurgery recommended conservative management and observation, with repeat CT in 12-24 hours.  Admitted for cardiology consult and pacemaker placement on 12/3.  Subjective 12/3: Patient seen, awake laying in bed, husband at bedside.  She denies chest pain, palpitations, dizziness, SOB or other acute symptoms.  Does have some head / facial pain from her fall.  No acute events reported overnight.    Assessment & Plan:   Principal Problem:   Complete heart block (HCC) Active Problems:   Hypothyroidism   Essential hypertension   GERD   Heart block   Subdural hematoma (HCC)   Depression with anxiety   Chronic diastolic CHF (congestive heart failure) ([place])   Fall   Complete heart block ([place]): since admission, HR remains in 40-50s. Hemodynamically stable.  Dr. Lorinda Creed is planning to place pacemaker today. - continue telemetry monitoring - if hemodynamically unstable, place pacer pads and call cardiology  Hypothyroidism: TSH 1.392 today - Continue Synthroid  Essential hypertension: -hold lisinopril due to soft bp -prn Hydralazine  GERD: -PPI  Subdural hematoma ([place]): the repeated CT- head showed resolving anterior parafalcine hemorrhage. -hold ASA  Depression with anxiety: -continue  home meds  Chronic diastolic CHF (congestive heart failure) ([place]):  Stable. 2d echo on 03/05/19 showed EF 60-65%. No SOB or signs of CHF exacerbation -check BNP  Fall: Maybe due to A-V block. -PT/OT   DVT prophylaxis: SCD's   Code Status: Full Code  Family Communication: husband at bedside  Disposition Plan:  Expect d/c home after pacemaker, pending cardiology clearance   Consultants:   Cardiology  Procedures:   None  Antimicrobials:   None    Objective: Vitals:   03/06/19 0615 03/06/19 0715 03/06/19 0730 03/06/19 0800  BP: (!) 187/72 (!) 169/78 (!) 161/88 (!) 152/75  Pulse: (!) 42 (!) 46 80 (!) 42  Resp: 17 17 19 11   Temp:      TempSrc:      SpO2: 95% 96% 99% 98%  Weight:        Intake/Output Summary (Last 24 hours) at 03/06/2019 0839 Last data filed at 03/06/2019 0534 Gross per 24 hour  Intake 1000 ml  Output --  Net 1000 ml   Filed Weights   03/04/19 1219  Weight: 94.8 kg    Examination:  General exam: awake, alert, no acute distress HEENT: right eye with periorbital ecchymosis and abrasion above eyebrow, clear conjunctiva, moist mucus membranes, hearing grossly normal  Respiratory system: clear to auscultation bilaterally, no wheezes, rales or rhonchi, normal respiratory effort. Cardiovascular system: normal S1/S2, RRR, no JVD, murmurs, rubs, gallops, nonpitting pedal edema.   Gastrointestinal system: soft, non-tender, non-distended abdomen, normal bowel sounds. Central nervous system: alert and oriented x4. no gross focal neurologic deficits, normal speech Extremities: moves all, no edema, normal tone Psychiatry: normal  mood, congruent affect, judgement and insight appear normal    Data Reviewed: I have personally reviewed following labs and imaging studies  CBC: Recent Labs  Lab 03/04/19 1411 03/06/19 0539  WBC 6.4 5.7  NEUTROABS 4.8  --   HGB 12.6 11.8*  HCT 38.6 36.7  MCV 92.3 91.1  PLT 144* [id number]*   Basic Metabolic Panel: Recent  Labs  Lab 03/04/19 1404 03/04/19 1411 03/06/19 0539  NA  --  142 138  K  --  3.7 4.1  CL  --  108 106  CO2  --  23 24  GLUCOSE  --  135* 113*  BUN  --  25* 16  CREATININE  --  0.91 0.75  CALCIUM  --  8.6* 8.3*  MG 1.8  --   --    GFR: Estimated Creatinine Clearance: 74.4 mL/min (by C-G formula based on SCr of 0.75 mg/dL). Liver Function Tests: Recent Labs  Lab 03/04/19 1411  AST 22  ALT 19  ALKPHOS 67  BILITOT 0.9  PROT 6.9  ALBUMIN 3.9   No results for input(s): LIPASE, AMYLASE in the last 168 hours. No results for input(s): AMMONIA in the last 168 hours. Coagulation Profile: No results for input(s): INR, PROTIME in the last 168 hours. Cardiac Enzymes: No results for input(s): CKTOTAL, CKMB, CKMBINDEX, TROPONINI in the last 168 hours. BNP (last 3 results) No results for input(s): PROBNP in the last 8760 hours. HbA1C: No results for input(s): HGBA1C in the last 72 hours. CBG: No results for input(s): GLUCAP in the last 168 hours. Lipid Profile: No results for input(s): CHOL, HDL, LDLCALC, TRIG, CHOLHDL, LDLDIRECT in the last 72 hours. Thyroid Function Tests: Recent Labs    03/04/19 1404  TSH 1.392   Anemia Panel: No results for input(s): VITAMINB12, FOLATE, FERRITIN, TIBC, IRON, RETICCTPCT in the last 72 hours. Sepsis Labs: No results for input(s): PROCALCITON, LATICACIDVEN in the last 168 hours.  Recent Results (from the past 240 hour(s))  SARS CORONAVIRUS 2 (TAT 6-24 HRS) Nasopharyngeal Nasopharyngeal Swab     Status: None   Collection Time: 03/04/19  5:33 PM   Specimen: Nasopharyngeal Swab  Result Value Ref Range Status   SARS Coronavirus 2 NEGATIVE NEGATIVE Final    Comment: (NOTE) SARS-CoV-2 target nucleic acids are NOT DETECTED. The SARS-CoV-2 RNA is generally detectable in upper and lower respiratory specimens during the acute phase of infection. Negative results do not preclude SARS-CoV-2 infection, do not rule out co-infections with other  pathogens, and should not be used as the sole basis for treatment or other patient management decisions. Negative results must be combined with clinical observations, patient history, and epidemiological information. The expected result is Negative. Fact Sheet for Patients: SugarRoll.be Fact Sheet for Healthcare Providers: https://www.woods-mathews.com/ This test is not yet approved or cleared by the [place] FDA and  has been authorized for detection and/or diagnosis of SARS-CoV-2 by FDA under an Emergency Use Authorization (EUA). This EUA will remain  in effect (meaning this test can be used) for the duration of the COVID-19 declaration under Section 56 4(b)(1) of the Act, 21 U.S.C. section 360bbb-3(b)(1), unless the authorization is terminated or revoked sooner. Performed at [facility], [place], [place], [place] [address]          Radiology Studies: Dg Lumbar Spine 2-3 Views  Result Date: 03/04/2019 CLINICAL DATA:  Fall EXAM: LUMBAR SPINE - 2-3 VIEW COMPARISON:  None. FINDINGS: Mild dextrocurvature. Lumbar lordosis is maintained. No acute  fracture identified. Multilevel degenerative changes including disc height loss, endplate osteophytes, and facet hypertrophy. IMPRESSION: No acute fracture identified.  Multilevel degenerative changes. Electronically Signed   By: Macy Mis M.D.   On: 03/04/2019 13:23   Ct Head Wo Contrast  Result Date: 03/05/2019 CLINICAL DATA:  Probable subdural hematoma. Follow-up. EXAM: CT HEAD WITHOUT CONTRAST TECHNIQUE: Contiguous axial images were obtained from the base of the skull through the vertex without intravenous contrast. COMPARISON:  CT head without contrast 03/04/2019 FINDINGS: Brain: Previously noted prominence the anterior falx has improved, suggesting resolving small hemorrhage. No new hemorrhage is present. A remote lacunar infarct of the right external capsule is noted. No  other focal lesions are evident. The brainstem and cerebellum are within normal limits. The ventricles are of normal size. Vascular: The minimal atherosclerotic changes are noted within the cavernous internal carotid arteries. There is no hyperdense vessel. Skull: Calvarium is intact. No focal lytic or blastic lesions are present. Right supraorbital soft tissue swelling is again noted. No foreign body is present. Sinuses/Orbits: The paranasal sinuses and mastoid air cells are clear. The globes and orbits are within normal limits. IMPRESSION: 1. Resolving anterior parafalcine hemorrhage. 2. No new hemorrhage. 3. Right supraorbital soft tissue swelling without an underlying fracture. 4. Remote lacunar infarct of the right external capsule. Electronically Signed   By: San Morelle M.D.   On: 03/05/2019 05:59   Ct Head Wo Contrast  Result Date: 03/04/2019 CLINICAL DATA:  Facial injury after fall last night. EXAM: CT HEAD WITHOUT CONTRAST CT MAXILLOFACIAL WITHOUT CONTRAST CT CERVICAL SPINE WITHOUT CONTRAST TECHNIQUE: Multidetector CT imaging of the head, cervical spine, and maxillofacial structures were performed using the standard protocol without intravenous contrast. Multiplanar CT image reconstructions of the cervical spine and maxillofacial structures were also generated. COMPARISON:  None. FINDINGS: CT HEAD FINDINGS Brain: Mild chronic ischemic white matter disease is noted. No mass effect or midline shift is noted. Ventricular size is within normal limits. Probable small subdural hematoma is seen involving the para falcine region anteriorly. No mass lesion or acute infarction is noted. Vascular: No hyperdense vessel or unexpected calcification. Skull: Normal. Negative for fracture or focal lesion. Other: Small right frontal scalp hematoma is noted. CT MAXILLOFACIAL FINDINGS Osseous: No fracture or mandibular dislocation. No destructive process. Orbits: Negative. No traumatic or inflammatory finding.  Sinuses: Clear. Soft tissues: Right supraorbital soft tissue contusion is noted. CT CERVICAL SPINE FINDINGS Alignment: Normal. Skull base and vertebrae: No acute fracture. No primary bone lesion or focal pathologic process. Soft tissues and spinal canal: No prevertebral fluid or swelling. No visible canal hematoma. Disc levels: Severe degenerative disc disease is noted at C3-4, C4-5, C5-6 and C6-7. Upper chest: Negative. Other: None. IMPRESSION: Probable small anterior parafalcine subdural hematoma is noted. No mass effect or midline shift is noted. Critical Value/emergent results were called by telephone at the time of interpretation on 03/04/2019 at 1:35 pm to [facility], who verbally acknowledged these results. Small right frontal scalp and supraorbital contusions or hematoma are present. No osseous abnormality is noted in maxillofacial region. Severe multilevel degenerative disc disease is noted in the cervical spine. No acute fracture or spondylolisthesis is noted. Electronically Signed   By: Marijo Conception M.D.   On: 03/04/2019 13:36   Ct Cervical Spine Wo Contrast  Result Date: 03/04/2019 CLINICAL DATA:  Facial injury after fall last night. EXAM: CT HEAD WITHOUT CONTRAST CT MAXILLOFACIAL WITHOUT CONTRAST CT CERVICAL SPINE WITHOUT CONTRAST TECHNIQUE: Multidetector CT imaging of the head,  cervical spine, and maxillofacial structures were performed using the standard protocol without intravenous contrast. Multiplanar CT image reconstructions of the cervical spine and maxillofacial structures were also generated. COMPARISON:  None. FINDINGS: CT HEAD FINDINGS Brain: Mild chronic ischemic white matter disease is noted. No mass effect or midline shift is noted. Ventricular size is within normal limits. Probable small subdural hematoma is seen involving the para falcine region anteriorly. No mass lesion or acute infarction is noted. Vascular: No hyperdense vessel or unexpected calcification. Skull:  Normal. Negative for fracture or focal lesion. Other: Small right frontal scalp hematoma is noted. CT MAXILLOFACIAL FINDINGS Osseous: No fracture or mandibular dislocation. No destructive process. Orbits: Negative. No traumatic or inflammatory finding. Sinuses: Clear. Soft tissues: Right supraorbital soft tissue contusion is noted. CT CERVICAL SPINE FINDINGS Alignment: Normal. Skull base and vertebrae: No acute fracture. No primary bone lesion or focal pathologic process. Soft tissues and spinal canal: No prevertebral fluid or swelling. No visible canal hematoma. Disc levels: Severe degenerative disc disease is noted at C3-4, C4-5, C5-6 and C6-7. Upper chest: Negative. Other: None. IMPRESSION: Probable small anterior parafalcine subdural hematoma is noted. No mass effect or midline shift is noted. Critical Value/emergent results were called by telephone at the time of interpretation on 03/04/2019 at 1:35 pm to [facility], who verbally acknowledged these results. Small right frontal scalp and supraorbital contusions or hematoma are present. No osseous abnormality is noted in maxillofacial region. Severe multilevel degenerative disc disease is noted in the cervical spine. No acute fracture or spondylolisthesis is noted. Electronically Signed   By: Marijo Conception M.D.   On: 03/04/2019 13:36   Ct Maxillofacial Wo Contrast  Result Date: 03/04/2019 CLINICAL DATA:  Facial injury after fall last night. EXAM: CT HEAD WITHOUT CONTRAST CT MAXILLOFACIAL WITHOUT CONTRAST CT CERVICAL SPINE WITHOUT CONTRAST TECHNIQUE: Multidetector CT imaging of the head, cervical spine, and maxillofacial structures were performed using the standard protocol without intravenous contrast. Multiplanar CT image reconstructions of the cervical spine and maxillofacial structures were also generated. COMPARISON:  None. FINDINGS: CT HEAD FINDINGS Brain: Mild chronic ischemic white matter disease is noted. No mass effect or midline  shift is noted. Ventricular size is within normal limits. Probable small subdural hematoma is seen involving the para falcine region anteriorly. No mass lesion or acute infarction is noted. Vascular: No hyperdense vessel or unexpected calcification. Skull: Normal. Negative for fracture or focal lesion. Other: Small right frontal scalp hematoma is noted. CT MAXILLOFACIAL FINDINGS Osseous: No fracture or mandibular dislocation. No destructive process. Orbits: Negative. No traumatic or inflammatory finding. Sinuses: Clear. Soft tissues: Right supraorbital soft tissue contusion is noted. CT CERVICAL SPINE FINDINGS Alignment: Normal. Skull base and vertebrae: No acute fracture. No primary bone lesion or focal pathologic process. Soft tissues and spinal canal: No prevertebral fluid or swelling. No visible canal hematoma. Disc levels: Severe degenerative disc disease is noted at C3-4, C4-5, C5-6 and C6-7. Upper chest: Negative. Other: None. IMPRESSION: Probable small anterior parafalcine subdural hematoma is noted. No mass effect or midline shift is noted. Critical Value/emergent results were called by telephone at the time of interpretation on 03/04/2019 at 1:35 pm to [facility], who verbally acknowledged these results. Small right frontal scalp and supraorbital contusions or hematoma are present. No osseous abnormality is noted in maxillofacial region. Severe multilevel degenerative disc disease is noted in the cervical spine. No acute fracture or spondylolisthesis is noted. Electronically Signed   By: Marijo Conception M.D.   On: 03/04/2019 13:36  Scheduled Meds:  bacitracin   Topical BID   calcium-vitamin D   Oral BID   cholecalciferol  1,000 Units Oral Daily   gentamicin irrigation  80 mg Irrigation On Call   levothyroxine  125 mcg Oral QAC breakfast   pantoprazole  40 mg Oral Daily   sertraline  100 mg Oral Daily   vitamin C  1,000 mg Oral Daily   zinc sulfate  220 mg Oral  Daily   Continuous Infusions:  sodium chloride     sodium chloride     sodium chloride 50 mL/hr at 03/06/19 0753   sodium chloride 20 mL/hr at 03/06/19 0752    ceFAZolin (ANCEF) IV      ceFAZolin (ANCEF) IV       LOS: 2 days    Time spent: 35-40 minutes    Ezekiel Slocumb, DO [facility] Pager: [phone]  If 7PM-7AM, please contact night-coverage [email or web address] Password [facility] 03/06/2019, 8:39 AM

## 2019-03-06 NOTE — Progress Notes (Signed)
Patient can have heart healthy diet per Dr. Saralyn Pilar. Planning for pacemaker placement tomorrow.   Patient admitted to unit. Oriented to room, call bell, and staff. Bed in lowest position. Fall safety plan reviewed. Full assessment to Epic. Skin assessment verified with Roanna Epley RN. Telemetry box verification with tele clerk- Box#: [id number]. Will continue to monitor.

## 2019-03-06 NOTE — Progress Notes (Signed)
PT Cancellation Note  Patient Details Name: Brandy Golden MRN: [id number] DOB: May 28, 1945   Cancelled Treatment:    Reason Eval/Treat Not Completed: Patient not medically ready Chart reviewed, pt to have pacemaker placed later today.  Spoke with pt and spouse and some PLOF/home information gathered.  HR labile 40s to >100 with no active, supine in bed.  Will hold PT this date and see her tomorrow as medically appropriate.   Kreg Shropshire, DPT 03/06/2019, 3:12 PM

## 2019-03-06 NOTE — Progress Notes (Signed)
OT Cancellation Note  Patient Details Name: Brandy Golden MRN: [id number] DOB: 1945/12/24   Cancelled Treatment:    Reason Eval/Treat Not Completed: Medical issues which prohibited therapy;Other (comment). Thank you for the OT consult. Order received and chart reviewed. Pt noted to be awaiting pacemaker placement this date. Will hold OT evaluation at this time and follow acutely. Will re-attempt at a later time/date as available and pt medically appropriate for OT evaluation.   Shara Blazing, M.S., OTR/L Ascom: [phone] 03/06/19, 10:51 AM

## 2019-03-07 ENCOUNTER — Encounter
Admission: EM | Disposition: A | Discharge status: Home / Self Care | Payer: Self-pay | Source: Ambulatory Visit | Attending: Internal Medicine

## 2019-03-07 ENCOUNTER — Encounter: Payer: Self-pay | Admitting: Cardiology

## 2019-03-07 DIAGNOSIS — E039 Hypothyroidism, unspecified: Secondary | ICD-10-CM

## 2019-03-07 HISTORY — PX: PACEMAKER LEADLESS INSERTION: EP1219

## 2019-03-07 HISTORY — PX: TEMPORARY PACEMAKER: CATH118268

## 2019-03-07 LAB — GLUCOSE, CAPILLARY: Glucose-Capillary: 114 mg/dL — ABNORMAL HIGH (ref 70–99)

## 2019-03-07 SURGERY — PACEMAKER LEADLESS INSERTION
Anesthesia: Moderate Sedation

## 2019-03-07 MED ORDER — ONDANSETRON HCL 4 MG/2ML IJ SOLN: 4.0000 mg | Freq: Four times a day (QID) | INTRAMUSCULAR | Status: DC | PRN

## 2019-03-07 MED ORDER — LISINOPRIL 10 MG PO TABS
10.0000 mg | ORAL_TABLET | Freq: Every day | ORAL | Status: DC
  Administered 2019-03-07 – 2019-03-08 (×2): 10 mg via ORAL
  Filled 2019-03-07 (×2): qty 1

## 2019-03-07 MED ORDER — SODIUM CHLORIDE 0.9 % IV SOLN: 250.0000 mL | INTRAVENOUS | Status: DC | PRN

## 2019-03-07 MED ORDER — HEPARIN SODIUM (PORCINE) 1000 UNIT/ML IJ SOLN
INTRAMUSCULAR | Status: AC
  Filled 2019-03-07: qty 1

## 2019-03-07 MED ORDER — IOHEXOL 300 MG/ML  SOLN
INTRAMUSCULAR | Status: DC | PRN
  Administered 2019-03-07: 20 mL

## 2019-03-07 MED ORDER — HEPARIN SODIUM (PORCINE) 1000 UNIT/ML IJ SOLN
INTRAMUSCULAR | Status: DC | PRN
  Administered 2019-03-07: 4000 [IU] via INTRAVENOUS

## 2019-03-07 MED ORDER — SODIUM CHLORIDE 0.9% FLUSH
3.0000 mL | Freq: Two times a day (BID) | INTRAVENOUS | Status: DC
  Administered 2019-03-07 – 2019-03-08 (×2): 3 mL via INTRAVENOUS

## 2019-03-07 MED ORDER — SODIUM CHLORIDE 0.9% FLUSH: 3.0000 mL | INTRAVENOUS | Status: DC | PRN

## 2019-03-07 MED ORDER — FENTANYL CITRATE (PF) 100 MCG/2ML IJ SOLN
INTRAMUSCULAR | Status: DC | PRN
  Administered 2019-03-07 (×2): 25 ug via INTRAVENOUS

## 2019-03-07 MED ORDER — MIDAZOLAM HCL 2 MG/2ML IJ SOLN
INTRAMUSCULAR | Status: DC | PRN
  Administered 2019-03-07 (×2): 1 mg via INTRAVENOUS

## 2019-03-07 MED ORDER — SODIUM CHLORIDE 0.45 % IV SOLN: INTRAVENOUS | Status: DC

## 2019-03-07 MED ORDER — FENTANYL CITRATE (PF) 100 MCG/2ML IJ SOLN
INTRAMUSCULAR | Status: AC
  Filled 2019-03-07: qty 2

## 2019-03-07 MED ORDER — HEPARIN (PORCINE) IN NACL 2000-0.9 UNIT/L-% IV SOLN
INTRAVENOUS | Status: DC | PRN
  Administered 2019-03-07: 1000 mL

## 2019-03-07 MED ORDER — ACETAMINOPHEN 325 MG PO TABS
650.0000 mg | ORAL_TABLET | ORAL | Status: DC | PRN
  Administered 2019-03-07: 650 mg via ORAL

## 2019-03-07 MED ORDER — MIDAZOLAM HCL 2 MG/2ML IJ SOLN
INTRAMUSCULAR | Status: AC
  Filled 2019-03-07: qty 2

## 2019-03-07 MED ORDER — CEFAZOLIN SODIUM-DEXTROSE 2-4 GM/100ML-% IV SOLN
INTRAVENOUS | Status: AC
  Filled 2019-03-07: qty 100

## 2019-03-07 SURGICAL SUPPLY — 16 items
CABLE ADAPT CONN TEMP 6FT (ADAPTER) ×2 IMPLANT
DILATOR VESSEL 38 20CM 12FR (INTRODUCER) ×2 IMPLANT
DILATOR VESSEL 38 20CM 14FR (INTRODUCER) ×2 IMPLANT
DILATOR VESSEL 38 20CM 18FR (INTRODUCER) ×2 IMPLANT
DILATOR VESSEL 38 20CM 8FR (INTRODUCER) ×2 IMPLANT
GUIDEWIRE SUPER STIFF .035X180 (WIRE) ×2 IMPLANT
MICRA AV TRANSCATH PACING SYS (Pacemaker) ×2 IMPLANT
MICRA INTRODUCER SHEATH (SHEATH) ×2
NEEDLE PERC 18GX7CM (NEEDLE) ×4 IMPLANT
PAD ELECT DEFIB RADIOL ZOLL (MISCELLANEOUS) ×2 IMPLANT
SHEATH AVANTI 6FR X 11CM (SHEATH) ×2 IMPLANT
SHEATH AVANTI 7FRX11 (SHEATH) ×2 IMPLANT
SHEATH INTRODUCER MICRA (SHEATH) ×1 IMPLANT
SYSTEM PACING TRNSCTH AV MICRA (Pacemaker) ×1 IMPLANT
WIRE GUIDERIGHT .035X150 (WIRE) ×2 IMPLANT
WIRE PACING TEMP ST TIP 5 (CATHETERS) ×2 IMPLANT

## 2019-03-07 NOTE — Evaluation (Signed)
Physical Therapy Evaluation Patient Details Name: Brandy Golden MRN: [id number] DOB: Oct 06, 1945 Today's Date: 03/07/2019   History of Present Illness  73 y.o. female with medical history significant of hypertension, GERD, hypothyroidism, depression, anxiety, GI bleeding, diverticulosis, subdural hematoma, dCHF, left bundle blockade, who presents with fall. Pt found to have bradycardia, heart block and ultimately had pacemaker placed 12/4.  Clinical Impression  Pt was able to circumambulate the nurses station (125 ft w/o AD) with no LOBs or overt safety issues.  She did show somewhat rigid gait pattern (mostly at ankles) and endorses chronic b/l LE numbness that likely has lead to multiple falls.  Pt's HR stayed 70s-80s t/o the effort and O2 in the high 90s with only very minimal c/o fatigue on returning to room.  Pt did well and will be able to safely go home with assist from husband.  PT recommending HHPT to address mild balance deficits as well as need to build cardiovascular/activity tolerance.  Pt has cane and walker options at home and PT encouraged her to use them initially until she has been able to work with PT to further confidence and safety.  Follow Up Recommendations Home health PT    Equipment Recommendations  None recommended by PT    Recommendations for Other Services       Precautions / Restrictions Precautions Precautions: Fall Restrictions Weight Bearing Restrictions: No      Mobility  Bed Mobility Overal bed mobility: Modified Independent             General bed mobility comments: Pt able to rise to sitting with little hesitantion, no assist needed  Transfers Overall transfer level: Modified independent Equipment used: Rolling walker (2 wheeled)             General transfer comment: (Pt able to rise from sitting w/o assist, min AD use)  Ambulation/Gait Ambulation/Gait assistance: Min guard Gait Distance (Feet): 250 Feet Assistive device: Rolling  walker (2 wheeled);None       General Gait Details: first 100 ft with walker, 25 ft with hallway rail and finally 125 ft with no AD.  Pt showed some minimal unsteadiness but did not have any LOBs or overt safety issues.  She reported only very minimal fatigue with the effort.  HR remained in the 70s-80s and O2 in the high 90s (on room air) t/o the effort  Stairs            Wheelchair Mobility    Modified Rankin (Stroke Patients Only)       Balance Overall balance assessment: Mild deficits observed, not formally tested                                           Pertinent Vitals/Pain Pain Assessment: No/denies pain    Home Living Family/patient expects to be discharged to:: Private residence Living Arrangements: Spouse/significant other Available Help at Discharge: Family;Available 24 hours/day   Home Access: Stairs to enter   Entrance Stairs-Number of Steps: 2   Home Equipment: [place] - 2 wheels;Walker - 4 wheels;Cane - single point      Prior Function Level of Independence: Independent(Pt would use cane on uneven surfaces, no AD inside)         Comments: Pt has had 5+ falls in the last 6 months     Hand Dominance        Extremity/Trunk Assessment  Upper Extremity Assessment Upper Extremity Assessment: Overall WFL for tasks assessed    Lower Extremity Assessment Lower Extremity Assessment: Overall WFL for tasks assessed(b/l LE distal numbness)       Communication   Communication: No difficulties  Cognition Arousal/Alertness: Awake/alert Behavior During Therapy: WFL for tasks assessed/performed Overall Cognitive Status: Within Functional Limits for tasks assessed                                 General Comments: Pt pleasant and appropriate t/o session, did show some impulsivity and vague confusion at times      General Comments      Exercises     Assessment/Plan    PT Assessment Patient needs continued PT  services  PT Problem List Decreased activity tolerance;Decreased balance;Decreased mobility;Decreased safety awareness;Decreased knowledge of use of DME;Cardiopulmonary status limiting activity;Impaired sensation       PT Treatment Interventions DME instruction;Gait training;Stair training;Functional mobility training;Therapeutic activities;Therapeutic exercise;Balance training;Neuromuscular re-education;Patient/family education    PT Goals (Current goals can be found in the Care Plan section)  Acute Rehab PT Goals Patient Stated Goal: go home PT Goal Formulation: With patient Time For Goal Achievement: 03/21/19 Potential to Achieve Goals: Good    Frequency Min 2X/week   Barriers to discharge        Co-evaluation               AM-PAC PT "6 Clicks" Mobility  Outcome Measure Help needed turning from your back to your side while in a flat bed without using bedrails?: None Help needed moving from lying on your back to sitting on the side of a flat bed without using bedrails?: None Help needed moving to and from a bed to a chair (including a wheelchair)?: None Help needed standing up from a chair using your arms (e.g., wheelchair or bedside chair)?: None Help needed to walk in hospital room?: A Little Help needed climbing 3-5 steps with a railing? : A Little 6 Click Score: 22    End of Session Equipment Utilized During Treatment: Gait belt Activity Tolerance: Patient tolerated treatment well Patient left: with bed alarm set;with call bell/phone within reach Nurse Communication: Mobility status(possible issue with IV, states she will look at it) PT Visit Diagnosis: Unsteadiness on feet (R26.81);Muscle weakness (generalized) (M62.81);History of falling (Z91.81)    Time: [id number] PT Time Calculation (min) (ACUTE ONLY): 23 min   Charges:   PT Evaluation $PT Eval Low Complexity: 1 Low PT Treatments $Gait Training: 8-22 mins        Kreg Shropshire, DPT 03/07/2019, 5:50  PM

## 2019-03-07 NOTE — Progress Notes (Signed)
OT Cancellation Note  Patient Details Name: Brandy Golden MRN: [id number] DOB: 02-14-1946   Cancelled Treatment:    Reason Eval/Treat Not Completed: Medical issues which prohibited therapy;Other (comment). Chart reviewed. Pt now with plan to have pacemaker placed today. Will hold OT evaluation at this time and continue to follow acutely to re-attempt once pacemaker has been placed and as medically appropriate.   Jeni Salles, MPH, MS, OTR/L ascom [phone] 03/07/19, 8:01 AM

## 2019-03-07 NOTE — Care Management Important Message (Signed)
Important Message  Patient Details  Name: LUCIANNA VERMILYEA MRN: [id number] Date of Birth: [birth year]   Medicare Important Message Given:  Yes     Dannette Barbara 03/07/2019, 11:59 AM

## 2019-03-07 NOTE — Progress Notes (Addendum)
PROGRESS NOTE    Brandy Golden  [id number] DOB: Sep 08, 1945 DOA: 03/04/2019  PCP: Shon Baton, MD    LOS - 3   Brief Narrative:  73 y.o.femalewith medical history significant ofhypertension, GERD, hypothyroidism, depression, anxiety, GI bleeding, diverticulosis, subdural hematoma,dCHF, left bundle blockade, who presents with fall and generalized weakness.  She fell in her garage on Monday, striking her face, has right eye and lip swelling and ecchymosis.  Went to [facility] ED at the time, but left before being seen.  In the ED here, patient found to be bradycardic with intermittent 2-1 AV block and 3rd degree block.  CT head with small right frontal subdural hematoma, for which neurosurgery recommended conservative management and observation, with repeat CT in 12-24 hours.  Admitted for cardiology consult and pacemaker placement on 12/4.  Subjective 12/4: Patient seen after pacemaker placement today.  Asks if her urine was checked for infection on admission because nurse at procedure today mentioned foul smelling urine, and patient reports some increased frequency.  Denies dysuria.  Did not sleep well past 2 nights in ED room.  No acute events reported overnight.     Assessment & Plan:   Principal Problem:   Complete heart block (HCC) Active Problems:   Hypothyroidism   Essential hypertension   GERD   Heart block   Subdural hematoma (HCC)   Depression with anxiety   Chronic diastolic CHF (congestive heart failure) ([place])   Fall   Complete heart block (HCC):since admission, HR remains in 40-50s. Hemodynamically stable.  Dr. Lorinda Creed is planning to place pacemaker today. - continue telemetry monitoring - if hemodynamically unstable, place pacer pads and call cardiology  Urinary Frequency  - check UA  Hypothyroidism:TSH 1.392  - Continue Synthroid  Essential hypertension: -resume lisinopril, initially held due to borderline BP's -prn Hydralazine  GERD: -PPI   Subdural hematoma (HCC):the repeated CT- head showed resolving anterior parafalcine hemorrhage. -hold ASA  Depression with anxiety: -continue home meds  Chronic diastolic CHF (congestive heart failure) ([place]): Stable. 2d echo on 03/05/19 showed EF 60-65%. No SOB or signs of CHF exacerbation -check BNP  Fall: Maybe due to A-V block. -PT/OT   DVT prophylaxis: SCD's   Code Status: Full Code  Family Communication: husband at bedside  Disposition Plan:  Expect d/c home 12/5, pending cardiology clearance   Consultants:   Cardiology  Procedures:   None  Antimicrobials:   None      Objective: Vitals:   03/07/19 1238 03/07/19 1257 03/07/19 1320 03/07/19 1449  BP: (!) 130/92 (!) 145/89 (!) 152/73 (!) 148/58  Pulse: 79 70 78 65  Resp: 14 17    Temp:    97.6 F (36.4 C)  TempSrc:    Oral  SpO2: 96% 95% 98% 98%  Weight:      Height:       No intake or output data in the 24 hours ending 03/07/19 1717 Filed Weights   03/04/19 1219 03/06/19 1545  Weight: 94.8 kg 94.8 kg    Examination:  General exam: awake, alert, no acute distress HEENT: ecchymosis around right eye, moist mucus membranes, hearing grossly normal  Respiratory system: clear to auscultation bilaterally, no wheezes, rales or rhonchi, normal respiratory effort. Cardiovascular system: normal S1/S2, RRR, no JVD, murmurs, rubs, gallops, no pedal edema.   Central nervous system: alert and oriented x4. no gross focal neurologic deficits, normal speech Extremities: moves all, no edema, normal tone Psychiatry: normal mood, congruent affect, judgement and insight appear normal  Data Reviewed: I have personally reviewed following labs and imaging studies  CBC: Recent Labs  Lab 03/04/19 1411 03/06/19 0539  WBC 6.4 5.7  NEUTROABS 4.8  --   HGB 12.6 11.8*  HCT 38.6 36.7  MCV 92.3 91.1  PLT 144* [id number]*   Basic Metabolic Panel: Recent Labs  Lab 03/04/19 1404 03/04/19 1411 03/06/19 0539  NA   --  142 138  K  --  3.7 4.1  CL  --  108 106  CO2  --  23 24  GLUCOSE  --  135* 113*  BUN  --  25* 16  CREATININE  --  0.91 0.75  CALCIUM  --  8.6* 8.3*  MG 1.8  --   --    GFR: Estimated Creatinine Clearance: 75.8 mL/min (by C-G formula based on SCr of 0.75 mg/dL). Liver Function Tests: Recent Labs  Lab 03/04/19 1411  AST 22  ALT 19  ALKPHOS 67  BILITOT 0.9  PROT 6.9  ALBUMIN 3.9   No results for input(s): LIPASE, AMYLASE in the last 168 hours. No results for input(s): AMMONIA in the last 168 hours. Coagulation Profile: No results for input(s): INR, PROTIME in the last 168 hours. Cardiac Enzymes: No results for input(s): CKTOTAL, CKMB, CKMBINDEX, TROPONINI in the last 168 hours. BNP (last 3 results) No results for input(s): PROBNP in the last 8760 hours. HbA1C: No results for input(s): HGBA1C in the last 72 hours. CBG: Recent Labs  Lab 03/06/19 0850 03/07/19 0755  GLUCAP 114* 114*   Lipid Profile: No results for input(s): CHOL, HDL, LDLCALC, TRIG, CHOLHDL, LDLDIRECT in the last 72 hours. Thyroid Function Tests: No results for input(s): TSH, T4TOTAL, FREET4, T3FREE, THYROIDAB in the last 72 hours. Anemia Panel: No results for input(s): VITAMINB12, FOLATE, FERRITIN, TIBC, IRON, RETICCTPCT in the last 72 hours. Sepsis Labs: No results for input(s): PROCALCITON, LATICACIDVEN in the last 168 hours.  Recent Results (from the past 240 hour(s))  SARS CORONAVIRUS 2 (TAT 6-24 HRS) Nasopharyngeal Nasopharyngeal Swab     Status: None   Collection Time: 03/04/19  5:33 PM   Specimen: Nasopharyngeal Swab  Result Value Ref Range Status   SARS Coronavirus 2 NEGATIVE NEGATIVE Final    Comment: (NOTE) SARS-CoV-2 target nucleic acids are NOT DETECTED. The SARS-CoV-2 RNA is generally detectable in upper and lower respiratory specimens during the acute phase of infection. Negative results do not preclude SARS-CoV-2 infection, do not rule out co-infections with other pathogens,  and should not be used as the sole basis for treatment or other patient management decisions. Negative results must be combined with clinical observations, patient history, and epidemiological information. The expected result is Negative. Fact Sheet for Patients: SugarRoll.be Fact Sheet for Healthcare Providers: https://www.woods-mathews.com/ This test is not yet approved or cleared by the [place] FDA and  has been authorized for detection and/or diagnosis of SARS-CoV-2 by FDA under an Emergency Use Authorization (EUA). This EUA will remain  in effect (meaning this test can be used) for the duration of the COVID-19 declaration under Section 56 4(b)(1) of the Act, 21 U.S.C. section 360bbb-3(b)(1), unless the authorization is terminated or revoked sooner. Performed at [facility], [place] [address], [place], [place] [address]   Surgical PCR screen     Status: None   Collection Time: 03/06/19  7:34 AM   Specimen: Nasal Mucosa; Nasal Swab  Result Value Ref Range Status   MRSA, PCR NEGATIVE NEGATIVE Final   Staphylococcus aureus NEGATIVE NEGATIVE Final  Comment: (NOTE) The Xpert SA Assay (FDA approved for NASAL specimens in patients 7 years of age and older), is one component of a comprehensive surveillance program. It is not intended to diagnose infection nor to guide or monitor treatment. Performed at [facility], [address], [place], [place] [address]          Radiology Studies: No results found.      Scheduled Meds: . bacitracin   Topical BID  . calcium-vitamin D   Oral BID  . cholecalciferol  1,000 Units Oral Daily  . levothyroxine  125 mcg Oral QAC breakfast  . lisinopril  10 mg Oral Daily  . pantoprazole  40 mg Oral Daily  . sertraline  100 mg Oral Daily  . sodium chloride flush  3 mL Intravenous Q12H  . vitamin C  1,000 mg Oral Daily  . you have a pacemaker book   Does not apply Once  .  zinc sulfate  220 mg Oral Daily   Continuous Infusions: . sodium chloride    . ceFAZolin       LOS: 3 days    Time spent: 30-35 minutes    Ezekiel Slocumb, DO [facility] Pager: [phone]  If 7PM-7AM, please contact night-coverage [email or web address] Password [email or web address] 03/07/2019, 5:17 PM

## 2019-03-08 LAB — CBC
HCT: 37.9 % (ref 36.0–46.0)
Hemoglobin: 12.2 g/dL (ref 12.0–15.0)
MCH: 29.7 pg (ref 26.0–34.0)
MCHC: 32.2 g/dL (ref 30.0–36.0)
MCV: 92.2 fL (ref 80.0–100.0)
Platelets: 131 10*3/uL — ABNORMAL LOW (ref 150–400)
RBC: 4.11 MIL/uL (ref 3.87–5.11)
RDW: 13.2 % (ref 11.5–15.5)
WBC: 7.2 10*3/uL (ref 4.0–10.5)
nRBC: 0 % (ref 0.0–0.2)

## 2019-03-08 LAB — BASIC METABOLIC PANEL
Anion gap: 8 (ref 5–15)
BUN: 14 mg/dL (ref 8–23)
CO2: 27 mmol/L (ref 22–32)
Calcium: 9.2 mg/dL (ref 8.9–10.3)
Chloride: 105 mmol/L (ref 98–111)
Creatinine, Ser: 0.92 mg/dL (ref 0.44–1.00)
GFR calc Af Amer: >60 mL/min (ref >60)
GFR calc non Af Amer: >60 mL/min (ref >60)
Glucose, Bld: 135 mg/dL — ABNORMAL HIGH (ref 70–99)
Potassium: 4.6 mmol/L (ref 3.5–5.1)
Sodium: 140 mmol/L (ref 135–145)

## 2019-03-08 LAB — GLUCOSE, CAPILLARY
Glucose-Capillary: 107 mg/dL — ABNORMAL HIGH (ref 70–99)
Glucose-Capillary: 103 mg/dL — ABNORMAL HIGH (ref 70–99)

## 2019-03-08 MED ORDER — YOU HAVE A PACEMAKER BOOK: 1.0000 | Freq: Once | Status: AC

## 2019-03-08 MED ORDER — BACITRACIN ZINC 500 UNIT/GM EX OINT: TOPICAL_OINTMENT | Freq: Two times a day (BID) | CUTANEOUS | 0 refills | Status: DC

## 2019-03-08 NOTE — Plan of Care (Signed)
?  Problem: Coping: ?Goal: Level of anxiety will decrease ?Outcome: Progressing ?  ?Problem: Safety: ?Goal: Ability to remain free from injury will improve ?Outcome: Progressing ?  ?

## 2019-03-08 NOTE — Discharge Instructions (Signed)
Discharge home today follow-up with cardiology in about 2 weeks Keep right groin clean and dry Remove dressing in 48 hours Call office if any fever bleeding or discharge

## 2019-03-08 NOTE — Care Management (Signed)
Patient had already discharged before I could set up home health. Message left for husband to call RNCM with selection of home health agency as he states he cannot remember who/what agency she used in the past.

## 2019-03-08 NOTE — Discharge Summary (Signed)
Physician Discharge Summary  QUANTA RISDON [id number] DOB: May 06, 1945 DOA: 03/04/2019  PCP: Shon Baton, MD  Admit date: 03/04/2019 Discharge date: 03/08/2019  Admitted From: Home Home Disposition: Home  Recommendations for Outpatient Follow-up:  1. Follow up with PCP in 1-2 weeks 2. Please obtain BMP/CBC in one week 3. Please follow up with cardiology, Dr. Saralyn Pilar  Home Health: Yes, PT Equipment/Devices: None  Discharge Condition: Stable CODE STATUS: Full Diet recommendation: Heart healthy  Brief/Interim Summary:  73 y.o.femalewith medical history significant ofhypertension, GERD, hypothyroidism, depression, anxiety, GI bleeding, diverticulosis, subdural hematoma,dCHF, left bundle blockade, who presents with falland generalized weakness.She fell in her garage on Monday, striking her face, has right eye and lip swelling and ecchymosis. Went to [facility] ED at the time, but left before being seen. In the ED here, patient found to be bradycardic with intermittent 2-1 AV block and 3rd degree block. CT head with small right frontal subdural hematoma, for which neurosurgery recommended conservative management and observation, with repeat CT in 12-24 hours. Admitted for cardiology consult and underwent pacemaker placement on 12/4.  Patient stable after procedure.  Evaluated by physical therapy who recommended home health PT on discharge.  Patient clinically improved and hemodynamically stable for discharge home today and agrees with the plan.  To follow-up with cardiology and PCP.   Discharge Diagnoses: Principal Problem:   Complete heart block (HCC) Active Problems:   Hypothyroidism   Essential hypertension   GERD   Heart block   Subdural hematoma (HCC)   Depression with anxiety   Chronic diastolic CHF (congestive heart failure) (HCC)   Fall   Complete heart block (HCC):since admission,HRremains in40-50s.Hemodynamically stable.Dr. Lorinda Creed is planning to place  pacemakertoday. - continue telemetry monitoring - if hemodynamically unstable, place pacer pads and call cardiology  Hypothyroidism:TSH 1.392  -Continue Synthroid  Essential hypertension: -resume lisinopril, initially held due to borderline BP's -prn Hydralazine  GERD: -PPI  Subdural hematoma (HCC):the repeated CT- head showed resolving anterior parafalcine hemorrhage. -hold ASA  Depression with anxiety: -continue home meds  Chronic diastolic CHF (congestive heart failure) ([facility]): Stable. 2d echo on 03/05/19 showed EF 60-65%. No SOB or signs of CHF exacerbation -check BNP  Fall: Maybe due to A-V block. -PT/OT  Discharge Instructions   Discharge Instructions    Call MD for:  extreme fatigue   Complete by: As directed    Call MD for:  redness, tenderness, or signs of infection (pain, swelling, redness, odor or green/yellow discharge around incision site)   Complete by: As directed    Call MD for:  temperature >100.4   Complete by: As directed    Diet - low sodium heart healthy   Complete by: As directed    Discharge instructions   Complete by: As directed    Per Dr. Saralyn Pilar: - keep bandage over right groin site until tomorrow - silk stitch at right groin site can be removed tomorrow (Sun 12/6), then apply gauze dressing over site - do not shower until tomorrow  - follow up in 1 week with Dr. Saralyn Pilar in clinic   Increase activity slowly   Complete by: As directed      Allergies as of 03/08/2019      Reactions   Other    MYCINS      Medication List    TAKE these medications   ALPRAZolam 0.5 MG tablet Commonly known as: XANAX Take 0.5 mg by mouth 2 (two) times daily as needed for anxiety or sleep.   aspirin  81 MG tablet Take 81 mg by mouth at bedtime.   bacitracin ointment Apply topically 2 (two) times daily.   Biotin 5 MG Caps Take 5 mg by mouth 2 (two) times daily.   CALCIUM + VITAMIN D3 PO Take 2 tablets by mouth 2 (two) times daily.    hyoscyamine 0.125 MG SL tablet Commonly known as: LEVSIN SL Place 0.125-0.25 mg under the tongue daily as needed for cramping.   lisinopril 10 MG tablet Commonly known as: ZESTRIL Take 10 mg by mouth daily.   omeprazole 20 MG capsule Commonly known as: PRILOSEC Take 20 mg by mouth 2 (two) times daily before a meal.   oxymetazoline 0.05 % nasal spray Commonly known as: AFRIN Place 2 sprays into both nostrils 2 (two) times daily as needed for congestion.   ROLAIDS PO Take 2 each by mouth daily as needed (for acid reflux).   sertraline 100 MG tablet Commonly known as: ZOLOFT Take 100 mg by mouth daily.   Synthroid 125 MCG tablet Generic drug: levothyroxine Take 125 mcg by mouth daily before breakfast.   vitamin C 500 MG tablet Commonly known as: ASCORBIC ACID Take 1,000 mg by mouth daily.   Vitamin D3 25 MCG (1000 UT) Caps Take 1,000 Units by mouth daily.   you have a pacemaker book Misc 1 each by Does not apply route once for 1 dose.   zinc gluconate 50 MG tablet Take 50 mg by mouth daily.      Follow-up Information    Paraschos, Alexander, MD. Schedule an appointment as soon as possible for a visit in 2 week(s).   Specialty: Cardiology Why: Follow-up in the office in about 2 weeks Contact information: [facility] [place] [address] [phone]        Shon Baton, MD. Schedule an appointment as soon as possible for a visit in 1 week(s).   Specialty: Internal Medicine Contact information: [place] [address] [phone]          Allergies  Allergen Reactions  . Other     MYCINS    Consultations:  Cardiology   Procedures/Studies: Dg Lumbar Spine 2-3 Views  Result Date: 03/04/2019 CLINICAL DATA:  Fall EXAM: LUMBAR SPINE - 2-3 VIEW COMPARISON:  None. FINDINGS: Mild dextrocurvature. Lumbar lordosis is maintained. No acute fracture identified. Multilevel degenerative changes  including disc height loss, endplate osteophytes, and facet hypertrophy. IMPRESSION: No acute fracture identified.  Multilevel degenerative changes. Electronically Signed   By: Macy Mis M.D.   On: 03/04/2019 13:23   Ct Head Wo Contrast  Result Date: 03/05/2019 CLINICAL DATA:  Probable subdural hematoma. Follow-up. EXAM: CT HEAD WITHOUT CONTRAST TECHNIQUE: Contiguous axial images were obtained from the base of the skull through the vertex without intravenous contrast. COMPARISON:  CT head without contrast 03/04/2019 FINDINGS: Brain: Previously noted prominence the anterior falx has improved, suggesting resolving small hemorrhage. No new hemorrhage is present. A remote lacunar infarct of the right external capsule is noted. No other focal lesions are evident. The brainstem and cerebellum are within normal limits. The ventricles are of normal size. Vascular: The minimal atherosclerotic changes are noted within the cavernous internal carotid arteries. There is no hyperdense vessel. Skull: Calvarium is intact. No focal lytic or blastic lesions are present. Right supraorbital soft tissue swelling is again noted. No foreign body is present. Sinuses/Orbits: The paranasal sinuses and mastoid air cells are clear. The globes and orbits are within normal limits. IMPRESSION: 1. Resolving  anterior parafalcine hemorrhage. 2. No new hemorrhage. 3. Right supraorbital soft tissue swelling without an underlying fracture. 4. Remote lacunar infarct of the right external capsule. Electronically Signed   By: San Morelle M.D.   On: 03/05/2019 05:59   Ct Head Wo Contrast  Result Date: 03/04/2019 CLINICAL DATA:  Facial injury after fall last night. EXAM: CT HEAD WITHOUT CONTRAST CT MAXILLOFACIAL WITHOUT CONTRAST CT CERVICAL SPINE WITHOUT CONTRAST TECHNIQUE: Multidetector CT imaging of the head, cervical spine, and maxillofacial structures were performed using the standard protocol without intravenous contrast.  Multiplanar CT image reconstructions of the cervical spine and maxillofacial structures were also generated. COMPARISON:  None. FINDINGS: CT HEAD FINDINGS Brain: Mild chronic ischemic white matter disease is noted. No mass effect or midline shift is noted. Ventricular size is within normal limits. Probable small subdural hematoma is seen involving the para falcine region anteriorly. No mass lesion or acute infarction is noted. Vascular: No hyperdense vessel or unexpected calcification. Skull: Normal. Negative for fracture or focal lesion. Other: Small right frontal scalp hematoma is noted. CT MAXILLOFACIAL FINDINGS Osseous: No fracture or mandibular dislocation. No destructive process. Orbits: Negative. No traumatic or inflammatory finding. Sinuses: Clear. Soft tissues: Right supraorbital soft tissue contusion is noted. CT CERVICAL SPINE FINDINGS Alignment: Normal. Skull base and vertebrae: No acute fracture. No primary bone lesion or focal pathologic process. Soft tissues and spinal canal: No prevertebral fluid or swelling. No visible canal hematoma. Disc levels: Severe degenerative disc disease is noted at C3-4, C4-5, C5-6 and C6-7. Upper chest: Negative. Other: None. IMPRESSION: Probable small anterior parafalcine subdural hematoma is noted. No mass effect or midline shift is noted. Critical Value/emergent results were called by telephone at the time of interpretation on 03/04/2019 at 1:35 pm to [facility], who verbally acknowledged these results. Small right frontal scalp and supraorbital contusions or hematoma are present. No osseous abnormality is noted in maxillofacial region. Severe multilevel degenerative disc disease is noted in the cervical spine. No acute fracture or spondylolisthesis is noted. Electronically Signed   By: Marijo Conception M.D.   On: 03/04/2019 13:36   Ct Cervical Spine Wo Contrast  Result Date: 03/04/2019 CLINICAL DATA:  Facial injury after fall last night. EXAM: CT HEAD  WITHOUT CONTRAST CT MAXILLOFACIAL WITHOUT CONTRAST CT CERVICAL SPINE WITHOUT CONTRAST TECHNIQUE: Multidetector CT imaging of the head, cervical spine, and maxillofacial structures were performed using the standard protocol without intravenous contrast. Multiplanar CT image reconstructions of the cervical spine and maxillofacial structures were also generated. COMPARISON:  None. FINDINGS: CT HEAD FINDINGS Brain: Mild chronic ischemic white matter disease is noted. No mass effect or midline shift is noted. Ventricular size is within normal limits. Probable small subdural hematoma is seen involving the para falcine region anteriorly. No mass lesion or acute infarction is noted. Vascular: No hyperdense vessel or unexpected calcification. Skull: Normal. Negative for fracture or focal lesion. Other: Small right frontal scalp hematoma is noted. CT MAXILLOFACIAL FINDINGS Osseous: No fracture or mandibular dislocation. No destructive process. Orbits: Negative. No traumatic or inflammatory finding. Sinuses: Clear. Soft tissues: Right supraorbital soft tissue contusion is noted. CT CERVICAL SPINE FINDINGS Alignment: Normal. Skull base and vertebrae: No acute fracture. No primary bone lesion or focal pathologic process. Soft tissues and spinal canal: No prevertebral fluid or swelling. No visible canal hematoma. Disc levels: Severe degenerative disc disease is noted at C3-4, C4-5, C5-6 and C6-7. Upper chest: Negative. Other: None. IMPRESSION: Probable small anterior parafalcine subdural hematoma is noted. No mass effect or  midline shift is noted. Critical Value/emergent results were called by telephone at the time of interpretation on 03/04/2019 at 1:35 pm to [facility], who verbally acknowledged these results. Small right frontal scalp and supraorbital contusions or hematoma are present. No osseous abnormality is noted in maxillofacial region. Severe multilevel degenerative disc disease is noted in the cervical  spine. No acute fracture or spondylolisthesis is noted. Electronically Signed   By: Marijo Conception M.D.   On: 03/04/2019 13:36   Ct Maxillofacial Wo Contrast  Result Date: 03/04/2019 CLINICAL DATA:  Facial injury after fall last night. EXAM: CT HEAD WITHOUT CONTRAST CT MAXILLOFACIAL WITHOUT CONTRAST CT CERVICAL SPINE WITHOUT CONTRAST TECHNIQUE: Multidetector CT imaging of the head, cervical spine, and maxillofacial structures were performed using the standard protocol without intravenous contrast. Multiplanar CT image reconstructions of the cervical spine and maxillofacial structures were also generated. COMPARISON:  None. FINDINGS: CT HEAD FINDINGS Brain: Mild chronic ischemic white matter disease is noted. No mass effect or midline shift is noted. Ventricular size is within normal limits. Probable small subdural hematoma is seen involving the para falcine region anteriorly. No mass lesion or acute infarction is noted. Vascular: No hyperdense vessel or unexpected calcification. Skull: Normal. Negative for fracture or focal lesion. Other: Small right frontal scalp hematoma is noted. CT MAXILLOFACIAL FINDINGS Osseous: No fracture or mandibular dislocation. No destructive process. Orbits: Negative. No traumatic or inflammatory finding. Sinuses: Clear. Soft tissues: Right supraorbital soft tissue contusion is noted. CT CERVICAL SPINE FINDINGS Alignment: Normal. Skull base and vertebrae: No acute fracture. No primary bone lesion or focal pathologic process. Soft tissues and spinal canal: No prevertebral fluid or swelling. No visible canal hematoma. Disc levels: Severe degenerative disc disease is noted at C3-4, C4-5, C5-6 and C6-7. Upper chest: Negative. Other: None. IMPRESSION: Probable small anterior parafalcine subdural hematoma is noted. No mass effect or midline shift is noted. Critical Value/emergent results were called by telephone at the time of interpretation on 03/04/2019 at 1:35 pm to [facility], who verbally acknowledged these results. Small right frontal scalp and supraorbital contusions or hematoma are present. No osseous abnormality is noted in maxillofacial region. Severe multilevel degenerative disc disease is noted in the cervical spine. No acute fracture or spondylolisthesis is noted. Electronically Signed   By: Marijo Conception M.D.   On: 03/04/2019 13:36     Pacemaker placement on 12/4  Subjective: Patient seen and examined this morning.  No acute events per overnight.  Did have some bleeding at the right groin site yesterday evening after procedure, but this resolved and has not recurred.  Patient denies acute complaints including fevers, chills, dizziness, chest pain or shortness of breath.  Agrees with plan for discharge today.   Discharge Exam: Vitals:   03/08/19 0534 03/08/19 0854  BP: (!) 116/96 (!) 136/46  Pulse: 67 61  Resp:  19  Temp: 98.7 F (37.1 C) 97.8 F (36.6 C)  SpO2: 98% 98%   Vitals:   03/07/19 1449 03/07/19 1925 03/08/19 0534 03/08/19 0854  BP: (!) 148/58 (!) 102/53 (!) 116/96 (!) 136/46  Pulse: 65 70 67 61  Resp:    19  Temp: 97.6 F (36.4 C) 97.9 F (36.6 C) 98.7 F (37.1 C) 97.8 F (36.6 C)  TempSrc: Oral Oral Oral Oral  SpO2: 98% 100% 98% 98%  Weight:      Height:        General: Pt is alert, awake, not in acute distress Cardiovascular: RRR, S1/S2 +, no  rubs, no gallops Respiratory: CTA bilaterally, no wheezing, no rhonchi Abdominal: Soft, NT, ND, bowel sounds + Extremities: no edema, no cyanosis    The results of significant diagnostics from this hospitalization (including imaging, microbiology, ancillary and laboratory) are listed below for reference.     Microbiology: Recent Results (from the past 240 hour(s))  SARS CORONAVIRUS 2 (TAT 6-24 HRS) Nasopharyngeal Nasopharyngeal Swab     Status: None   Collection Time: 03/04/19  5:33 PM   Specimen: Nasopharyngeal Swab  Result Value Ref Range Status   SARS Coronavirus  2 NEGATIVE NEGATIVE Final    Comment: (NOTE) SARS-CoV-2 target nucleic acids are NOT DETECTED. The SARS-CoV-2 RNA is generally detectable in upper and lower respiratory specimens during the acute phase of infection. Negative results do not preclude SARS-CoV-2 infection, do not rule out co-infections with other pathogens, and should not be used as the sole basis for treatment or other patient management decisions. Negative results must be combined with clinical observations, patient history, and epidemiological information. The expected result is Negative. Fact Sheet for Patients: SugarRoll.be Fact Sheet for Healthcare Providers: https://www.woods-mathews.com/ This test is not yet approved or cleared by the [place] FDA and  has been authorized for detection and/or diagnosis of SARS-CoV-2 by FDA under an Emergency Use Authorization (EUA). This EUA will remain  in effect (meaning this test can be used) for the duration of the COVID-19 declaration under Section 56 4(b)(1) of the Act, 21 U.S.C. section 360bbb-3(b)(1), unless the authorization is terminated or revoked sooner. Performed at [facility], [place] [address], [place], [place] [address]   Surgical PCR screen     Status: None   Collection Time: 03/06/19  7:34 AM   Specimen: Nasal Mucosa; Nasal Swab  Result Value Ref Range Status   MRSA, PCR NEGATIVE NEGATIVE Final   Staphylococcus aureus NEGATIVE NEGATIVE Final    Comment: (NOTE) The Xpert SA Assay (FDA approved for NASAL specimens in patients 58 years of age and older), is one component of a comprehensive surveillance program. It is not intended to diagnose infection nor to guide or monitor treatment. Performed at [facility], [address] [place], [place] [address]      Labs: BNP (last 3 results) No results for input(s): BNP in the last 8760 hours. Basic Metabolic Panel: Recent Labs  Lab 03/04/19 1404  03/04/19 1411 03/06/19 0539 03/08/19 0601  NA  --  142 138 140  K  --  3.7 4.1 4.6  CL  --  108 106 105  CO2  --  23 24 27   GLUCOSE  --  135* 113* 135*  BUN  --  25* 16 14  CREATININE  --  0.91 0.75 0.92  CALCIUM  --  8.6* 8.3* 9.2  MG 1.8  --   --   --    Liver Function Tests: Recent Labs  Lab 03/04/19 1411  AST 22  ALT 19  ALKPHOS 67  BILITOT 0.9  PROT 6.9  ALBUMIN 3.9   No results for input(s): LIPASE, AMYLASE in the last 168 hours. No results for input(s): AMMONIA in the last 168 hours. CBC: Recent Labs  Lab 03/04/19 1411 03/06/19 0539 03/08/19 0601  WBC 6.4 5.7 7.2  NEUTROABS 4.8  --   --   HGB 12.6 11.8* 12.2  HCT 38.6 36.7 37.9  MCV 92.3 91.1 92.2  PLT 144* 134* 131*   Cardiac Enzymes: No results for input(s): CKTOTAL, CKMB, CKMBINDEX, TROPONINI in the last 168  hours. BNP: Invalid input(s): POCBNP CBG: Recent Labs  Lab 03/06/19 0850 03/07/19 0755 03/08/19 0851 03/08/19 1155  GLUCAP 114* 114* 107* 103*   D-Dimer No results for input(s): DDIMER in the last 72 hours. Hgb A1c No results for input(s): HGBA1C in the last 72 hours. Lipid Profile No results for input(s): CHOL, HDL, LDLCALC, TRIG, CHOLHDL, LDLDIRECT in the last 72 hours. Thyroid function studies No results for input(s): TSH, T4TOTAL, T3FREE, THYROIDAB in the last 72 hours.  Invalid input(s): FREET3 Anemia work up No results for input(s): VITAMINB12, FOLATE, FERRITIN, TIBC, IRON, RETICCTPCT in the last 72 hours. Urinalysis No results found for: COLORURINE, APPEARANCEUR, [place], [place], [place], [place], [place], [place], PROTEINUR, UROBILINOGEN, NITRITE, LEUKOCYTESUR Sepsis Labs Invalid input(s): PROCALCITONIN,  WBC,  LACTICIDVEN Microbiology Recent Results (from the past 240 hour(s))  SARS CORONAVIRUS 2 (TAT 6-24 HRS) Nasopharyngeal Nasopharyngeal Swab     Status: None   Collection Time: 03/04/19  5:33 PM   Specimen: Nasopharyngeal Swab  Result Value Ref Range Status    SARS Coronavirus 2 NEGATIVE NEGATIVE Final    Comment: (NOTE) SARS-CoV-2 target nucleic acids are NOT DETECTED. The SARS-CoV-2 RNA is generally detectable in upper and lower respiratory specimens during the acute phase of infection. Negative results do not preclude SARS-CoV-2 infection, do not rule out co-infections with other pathogens, and should not be used as the sole basis for treatment or other patient management decisions. Negative results must be combined with clinical observations, patient history, and epidemiological information. The expected result is Negative. Fact Sheet for Patients: SugarRoll.be Fact Sheet for Healthcare Providers: https://www.woods-mathews.com/ This test is not yet approved or cleared by the [place] FDA and  has been authorized for detection and/or diagnosis of SARS-CoV-2 by FDA under an Emergency Use Authorization (EUA). This EUA will remain  in effect (meaning this test can be used) for the duration of the COVID-19 declaration under Section 56 4(b)(1) of the Act, 21 U.S.C. section 360bbb-3(b)(1), unless the authorization is terminated or revoked sooner. Performed at [facility], [place] [address], [place], [place] [address]   Surgical PCR screen     Status: None   Collection Time: 03/06/19  7:34 AM   Specimen: Nasal Mucosa; Nasal Swab  Result Value Ref Range Status   MRSA, PCR NEGATIVE NEGATIVE Final   Staphylococcus aureus NEGATIVE NEGATIVE Final    Comment: (NOTE) The Xpert SA Assay (FDA approved for NASAL specimens in patients 28 years of age and older), is one component of a comprehensive surveillance program. It is not intended to diagnose infection nor to guide or monitor treatment. Performed at [facility], [address], [place], [place] [address]      Time coordinating discharge: Over 30 minutes  SIGNED:   Ezekiel Slocumb, DO [facility] 03/08/2019, 4:45  PM Pager [phone]  If 7PM-7AM, please contact night-coverage [email or web address] Password [email or web address]

## 2019-03-08 NOTE — Progress Notes (Signed)
Physician Discharge Summary  Patient ID: Brandy Golden MRN: [id number] DOB/AGE: 12-May-1945 73 y.o.  Admit date: 03/04/2019 Discharge date: 03/08/2019  Primary Discharge Diagnosis syncope complete heart block Secondary Discharge Diagnosis same status post permanent pacemaker  Significant Diagnostic Studies: radiology: CXR: normal and yes  Consults: None  Hospital Course: Admitted after a fall possible syncope found to be in complete heart block.  Patient was set up for permanent pacemaker procedure was done December 4 without difficulty patient had a Micra placed denies any symptoms since not ready for discharge no pain no swelling no bleeding from right groin area.   Discharge Exam: Blood pressure (!) 136/46, pulse 61, temperature 97.8 F (36.6 C), temperature source Oral, resp. rate 19, height 5\' 10"  (1.778 m), weight 94.8 kg, SpO2 98 %.    Labs:   Lab Results  Component Value Date   WBC 7.2 03/08/2019   HGB 12.2 03/08/2019   HCT 37.9 03/08/2019   MCV 92.2 03/08/2019   PLT 131 (L) 03/08/2019    Recent Labs  Lab 03/04/19 1411  03/08/19 0601  NA 142   < > 140  K 3.7   < > 4.6  CL 108   < > 105  CO2 23   < > 27  BUN 25*   < > 14  CREATININE 0.91   < > 0.92  CALCIUM 8.6*   < > 9.2  PROT 6.9  --   --   BILITOT 0.9  --   --   ALKPHOS 67  --   --   ALT 19  --   --   AST 22  --   --   GLUCOSE 135*   < > 135*   < > = values in this interval not displayed.      Radiology: Chest x-ray EKG: Initially complete heart block with bradycardia Now normal sinus rhythm occasional paced beats  FOLLOW UP PLANS AND APPOINTMENTS Discharge Instructions    Call MD for:  extreme fatigue   Complete by: As directed    Call MD for:  redness, tenderness, or signs of infection (pain, swelling, redness, odor or green/yellow discharge around incision site)   Complete by: As directed    Call MD for:  temperature >100.4   Complete by: As directed    Diet - low sodium heart healthy    Complete by: As directed    Discharge instructions   Complete by: As directed    Per Dr. Saralyn Pilar: - keep bandage over right groin site until tomorrow - silk stitch at right groin site can be removed tomorrow (Sun 12/6), then apply gauze dressing over site - do not shower until tomorrow  - follow up in 1 week with Dr. Saralyn Pilar in clinic   Increase activity slowly   Complete by: As directed      Allergies as of 03/08/2019      Reactions   Other    MYCINS      Medication List    TAKE these medications   ALPRAZolam 0.5 MG tablet Commonly known as: XANAX Take 0.5 mg by mouth 2 (two) times daily as needed for anxiety or sleep.   aspirin 81 MG tablet Take 81 mg by mouth at bedtime.   bacitracin ointment Apply topically 2 (two) times daily.   Biotin 5 MG Caps Take 5 mg by mouth 2 (two) times daily.   CALCIUM + VITAMIN D3 PO Take 2 tablets by mouth 2 (two) times daily.  hyoscyamine 0.125 MG SL tablet Commonly known as: LEVSIN SL Place 0.125-0.25 mg under the tongue daily as needed for cramping.   lisinopril 10 MG tablet Commonly known as: ZESTRIL Take 10 mg by mouth daily.   omeprazole 20 MG capsule Commonly known as: PRILOSEC Take 20 mg by mouth 2 (two) times daily before a meal.   oxymetazoline 0.05 % nasal spray Commonly known as: AFRIN Place 2 sprays into both nostrils 2 (two) times daily as needed for congestion.   ROLAIDS PO Take 2 each by mouth daily as needed (for acid reflux).   sertraline 100 MG tablet Commonly known as: ZOLOFT Take 100 mg by mouth daily.   Synthroid 125 MCG tablet Generic drug: levothyroxine Take 125 mcg by mouth daily before breakfast.   vitamin C 500 MG tablet Commonly known as: ASCORBIC ACID Take 1,000 mg by mouth daily.   Vitamin D3 25 MCG (1000 UT) Caps Take 1,000 Units by mouth daily.   you have a pacemaker book Misc 1 each by Does not apply route once for 1 dose.   zinc gluconate 50 MG tablet Take 50 mg by mouth  daily.      Follow-up Information    Paraschos, Alexander, MD. Schedule an appointment as soon as possible for a visit in 2 week(s).   Specialty: Cardiology Why: Follow-up in the office in about 2 weeks Contact information: [facility] [place] [address] [phone]        Shon Baton, MD. Schedule an appointment as soon as possible for a visit in 1 week(s).   Specialty: Internal Medicine Contact information: [address] [place] [address] [phone]           BRING ALL MEDICATIONS WITH YOU TO FOLLOW UP APPOINTMENTS  Time spent with patient to include physician time: Sutures removed from right groin area and new slight pressure bandage  placed Signed:  Yolonda Kida MD, PHD, [facility] 03/08/2019, 11:41 AM

## 2019-03-08 NOTE — Evaluation (Signed)
Occupational Therapy Evaluation Patient Details Name: Brandy Golden MRN: [id number] DOB: 03-Jan-1946 Today's Date: 03/08/2019    History of Present Illness 73 y.o. female with medical history significant of hypertension, GERD, hypothyroidism, depression, anxiety, GI bleeding, diverticulosis, subdural hematoma, dCHF, left bundle blockade, who presents with fall. Pt found to have bradycardia, heart block and ultimately had pacemaker placed 12/4.   Clinical Impression   Pt seen for OT evaluation this date. Prior to hospital admission, pt was Indep with ADLs/IADLs.  Pt lives in two story home with level entry with her spouse, but they remain on first level primarily.  Currently pt demonstrates impairments in fxl activity tolerance requiring SBA/supv for monitoring and cueing to initiate rest break with standing ADLs.  Pt would benefit from skilled OT to address noted impairments and functional limitations (see below for any additional details) in order to maximize safety and independence while minimizing falls risk and caregiver burden.  Upon hospital discharge, recommend pt discharge home with HHOT to improve fxl activity tolerance for household IADLs.    Follow Up Recommendations  Home health OT    Equipment Recommendations  None recommended by OT    Recommendations for Other Services       Precautions / Restrictions Precautions Precautions: Fall Restrictions Weight Bearing Restrictions: No Other Position/Activity Restrictions: monitor HR and spO2      Mobility Bed Mobility Overal bed mobility: Modified Independent             General bed mobility comments: requires increased time, no assist  Transfers Overall transfer level: Modified independent Equipment used: None             General transfer comment: requires increased time, no assist.    Balance Overall balance assessment: Mild deficits observed, not formally tested                                          ADL either performed or assessed with clinical judgement   ADL                                         General ADL Comments: Pt observered performing all aspects of dressing, toileting, hygiene and grooming with no AD at SBA/supervision level with MIN verbal cues intermittently to initiate seated rest break when pt's RR/WOB was noted to increased. Pt requires seated rest break after 2-3 mins standing activity-performing peri care, doff hospital brief and don her own brief and pants in prep for d/c home. Able to resume activity after 1-2 mins seated rest. Pt's HR remains in mid 60s-low 80s throughout with activity, pt does de-sat slightly with standing activity to 91% on RA, is able to quickly resume 98-99% spO2 with seated rest in 20-30 seconds.     Vision Baseline Vision/History: Wears glasses Wears Glasses: At all times Patient Visual Report: No change from baseline       Perception     Praxis      Pertinent Vitals/Pain Pain Assessment: No/denies pain     Hand Dominance     Extremity/Trunk Assessment Upper Extremity Assessment Upper Extremity Assessment: Overall WFL for tasks assessed   Lower Extremity Assessment Lower Extremity Assessment: Overall WFL for tasks assessed(reports numbness in feet, ankles noted to be slightly swollen which pt reports is present at  baseline for her.)       Communication Communication Communication: No difficulties   Cognition Arousal/Alertness: Awake/alert Behavior During Therapy: WFL for tasks assessed/performed Overall Cognitive Status: Within Functional Limits for tasks assessed                                     General Comments       Exercises Other Exercises Other Exercises: OT facilitates education with pt re: techniques for improving fxl activity tolerance at home as well as how to check her pulse, checking in with her body during standing activities, energy conservation-initiating  seated rest, and monitoring the time she can tolerate standing activity to gradually progress. Pt verbalized understanding, but could benefit from continued HHOT to improve standing tolerance for IADLs-meal prep, cleaning, etc.   Shoulder Instructions      Home Living Family/patient expects to be discharged to:: Private residence Living Arrangements: Spouse/significant other Available Help at Discharge: Family;Available 24 hours/day Type of Home: House Home Access: Stairs to enter [employer] of Steps: 2   Home Layout: Two level;Able to live on main level with bedroom/bathroom Alternate Level Stairs-Number of Steps: [place]: Walker - 2 wheels;Walker - 4 wheels;Cane - single point;Shower seat - built in;Grab bars - toilet;Grab bars - tub/shower;Other (comment)(comfort height commode)          Prior Functioning/Environment Level of Independence: Independent        Comments: Pt reports using cane occasionally for uneven/outdoor surface, but AD in the home. Pt endorses 5 or more falls over 6 month period        OT Problem List: Decreased strength;Decreased activity tolerance;Cardiopulmonary status limiting activity      OT Treatment/Interventions: Self-care/ADL training;Therapeutic exercise;Energy conservation;Therapeutic activities    OT Goals(Current goals can be found in the care plan section) Acute Rehab OT Goals Patient Stated Goal: go home OT Goal Formulation: All assessment and education complete, DC therapy  OT Frequency:     Barriers to D/C:            Co-evaluation              AM-PAC OT "6 Clicks" Daily Activity     Outcome Measure Help from another person eating meals?: None Help from another person taking care of personal grooming?: None Help from another person toileting, which includes using toliet, bedpan, or urinal?: None Help from another person bathing (including washing, rinsing, drying)?: None Help from  another person to put on and taking off regular upper body clothing?: None Help from another person to put on and taking off regular lower body clothing?: None 6 Click Score: 24   End of Session Equipment Utilized During Treatment: Gait belt Nurse Communication: Mobility status  Activity Tolerance: Patient tolerated treatment well Patient left: in bed;with call bell/phone within reach  OT Visit Diagnosis: Unsteadiness on feet (R26.81);Muscle weakness (generalized) (M62.81)                Time: [id number] OT Time Calculation (min): 50 min Charges:  OT General Charges $OT Visit: 1 Visit OT Evaluation $OT Eval Moderate Complexity: 1 Mod OT Treatments $Self Care/Home Management : 8-22 mins $Therapeutic Activity: 8-22 mins  Gerrianne Scale, MS, OTR/L ascom [phone] 03/08/19, 10:51 AM

## 2019-03-08 NOTE — Plan of Care (Signed)

## 2019-05-05 ENCOUNTER — Other Ambulatory Visit: Payer: Self-pay | Admitting: Internal Medicine

## 2019-05-05 DIAGNOSIS — Z1231 Encounter for screening mammogram for malignant neoplasm of breast: Secondary | ICD-10-CM

## 2019-05-16 ENCOUNTER — Ambulatory Visit
Admission: RE | Admit: 2019-05-16 | Discharge: 2019-05-16 | Disposition: A | Discharge status: Home / Self Care | Payer: Medicare PPO | Source: Ambulatory Visit | Attending: Internal Medicine | Admitting: Internal Medicine

## 2019-05-16 DIAGNOSIS — Z1231 Encounter for screening mammogram for malignant neoplasm of breast: Secondary | ICD-10-CM | POA: Diagnosis present

## 2019-11-11 DIAGNOSIS — R05 Cough: Secondary | ICD-10-CM | POA: Diagnosis not present

## 2019-11-11 DIAGNOSIS — Z1152 Encounter for screening for COVID-19: Secondary | ICD-10-CM | POA: Diagnosis not present

## 2019-11-13 DIAGNOSIS — J329 Chronic sinusitis, unspecified: Secondary | ICD-10-CM | POA: Diagnosis not present

## 2019-11-13 DIAGNOSIS — J3489 Other specified disorders of nose and nasal sinuses: Secondary | ICD-10-CM | POA: Diagnosis not present

## 2019-11-17 ENCOUNTER — Other Ambulatory Visit: Payer: Self-pay | Admitting: Otolaryngology

## 2019-11-17 DIAGNOSIS — J329 Chronic sinusitis, unspecified: Secondary | ICD-10-CM

## 2019-11-20 ENCOUNTER — Ambulatory Visit: Payer: Medicare PPO

## 2019-11-26 ENCOUNTER — Other Ambulatory Visit: Payer: Self-pay

## 2019-11-26 ENCOUNTER — Ambulatory Visit
Admission: RE | Admit: 2019-11-26 | Discharge: 2019-11-26 | Disposition: A | Discharge status: Home / Self Care | Payer: Medicare PPO | Source: Ambulatory Visit | Attending: Otolaryngology | Admitting: Otolaryngology

## 2019-11-26 DIAGNOSIS — J329 Chronic sinusitis, unspecified: Secondary | ICD-10-CM | POA: Diagnosis not present

## 2019-11-26 DIAGNOSIS — X32XXXA Exposure to sunlight, initial encounter: Secondary | ICD-10-CM | POA: Diagnosis not present

## 2019-11-26 DIAGNOSIS — I6381 Other cerebral infarction due to occlusion or stenosis of small artery: Secondary | ICD-10-CM | POA: Diagnosis not present

## 2019-11-26 DIAGNOSIS — J321 Chronic frontal sinusitis: Secondary | ICD-10-CM | POA: Diagnosis not present

## 2019-11-26 DIAGNOSIS — C44319 Basal cell carcinoma of skin of other parts of face: Secondary | ICD-10-CM | POA: Diagnosis not present

## 2019-11-26 DIAGNOSIS — L570 Actinic keratosis: Secondary | ICD-10-CM | POA: Diagnosis not present

## 2019-11-26 DIAGNOSIS — Z85828 Personal history of other malignant neoplasm of skin: Secondary | ICD-10-CM | POA: Diagnosis not present

## 2019-11-26 DIAGNOSIS — D225 Melanocytic nevi of trunk: Secondary | ICD-10-CM | POA: Diagnosis not present

## 2019-11-26 DIAGNOSIS — D2271 Melanocytic nevi of right lower limb, including hip: Secondary | ICD-10-CM | POA: Diagnosis not present

## 2019-11-26 DIAGNOSIS — D2262 Melanocytic nevi of left upper limb, including shoulder: Secondary | ICD-10-CM | POA: Diagnosis not present

## 2019-11-26 DIAGNOSIS — D485 Neoplasm of uncertain behavior of skin: Secondary | ICD-10-CM | POA: Diagnosis not present

## 2019-11-26 DIAGNOSIS — D2261 Melanocytic nevi of right upper limb, including shoulder: Secondary | ICD-10-CM | POA: Diagnosis not present

## 2019-12-01 DIAGNOSIS — C44319 Basal cell carcinoma of skin of other parts of face: Secondary | ICD-10-CM | POA: Diagnosis not present

## 2020-01-19 DIAGNOSIS — M3501 Sicca syndrome with keratoconjunctivitis: Secondary | ICD-10-CM | POA: Diagnosis not present

## 2020-02-17 DIAGNOSIS — I480 Paroxysmal atrial fibrillation: Secondary | ICD-10-CM | POA: Diagnosis not present

## 2020-03-04 DIAGNOSIS — I10 Essential (primary) hypertension: Secondary | ICD-10-CM | POA: Diagnosis not present

## 2020-03-04 DIAGNOSIS — J3801 Paralysis of vocal cords and larynx, unilateral: Secondary | ICD-10-CM | POA: Diagnosis not present

## 2020-03-04 DIAGNOSIS — R0600 Dyspnea, unspecified: Secondary | ICD-10-CM | POA: Diagnosis not present

## 2020-03-04 DIAGNOSIS — Z950 Presence of cardiac pacemaker: Secondary | ICD-10-CM | POA: Diagnosis not present

## 2020-03-04 DIAGNOSIS — Z23 Encounter for immunization: Secondary | ICD-10-CM | POA: Diagnosis not present

## 2020-03-04 DIAGNOSIS — K219 Gastro-esophageal reflux disease without esophagitis: Secondary | ICD-10-CM | POA: Diagnosis not present

## 2020-03-12 DIAGNOSIS — E785 Hyperlipidemia, unspecified: Secondary | ICD-10-CM | POA: Diagnosis not present

## 2020-03-12 DIAGNOSIS — I10 Essential (primary) hypertension: Secondary | ICD-10-CM | POA: Diagnosis not present

## 2020-03-12 DIAGNOSIS — E038 Other specified hypothyroidism: Secondary | ICD-10-CM | POA: Diagnosis not present

## 2020-03-12 DIAGNOSIS — I5032 Chronic diastolic (congestive) heart failure: Secondary | ICD-10-CM | POA: Diagnosis not present

## 2020-03-12 DIAGNOSIS — N1831 Chronic kidney disease, stage 3a: Secondary | ICD-10-CM | POA: Diagnosis not present

## 2020-03-12 DIAGNOSIS — R413 Other amnesia: Secondary | ICD-10-CM | POA: Diagnosis not present

## 2020-03-12 DIAGNOSIS — Z8616 Personal history of COVID-19: Secondary | ICD-10-CM | POA: Diagnosis not present

## 2020-03-12 DIAGNOSIS — F3341 Major depressive disorder, recurrent, in partial remission: Secondary | ICD-10-CM | POA: Diagnosis not present

## 2020-03-12 DIAGNOSIS — D692 Other nonthrombocytopenic purpura: Secondary | ICD-10-CM | POA: Diagnosis not present

## 2020-03-12 DIAGNOSIS — R2681 Unsteadiness on feet: Secondary | ICD-10-CM | POA: Diagnosis not present

## 2020-03-12 DIAGNOSIS — R739 Hyperglycemia, unspecified: Secondary | ICD-10-CM | POA: Diagnosis not present

## 2020-03-12 DIAGNOSIS — Z950 Presence of cardiac pacemaker: Secondary | ICD-10-CM | POA: Diagnosis not present

## 2020-03-19 DIAGNOSIS — K219 Gastro-esophageal reflux disease without esophagitis: Secondary | ICD-10-CM | POA: Diagnosis not present

## 2020-03-19 DIAGNOSIS — K449 Diaphragmatic hernia without obstruction or gangrene: Secondary | ICD-10-CM | POA: Diagnosis not present

## 2020-04-09 ENCOUNTER — Other Ambulatory Visit: Payer: Self-pay | Admitting: Internal Medicine

## 2020-04-09 DIAGNOSIS — Z1231 Encounter for screening mammogram for malignant neoplasm of breast: Secondary | ICD-10-CM

## 2020-04-12 ENCOUNTER — Ambulatory Visit: Payer: Medicare Other | Admitting: Surgery

## 2020-05-03 DIAGNOSIS — D485 Neoplasm of uncertain behavior of skin: Secondary | ICD-10-CM | POA: Diagnosis not present

## 2020-05-03 DIAGNOSIS — D2262 Melanocytic nevi of left upper limb, including shoulder: Secondary | ICD-10-CM | POA: Diagnosis not present

## 2020-05-03 DIAGNOSIS — Z85828 Personal history of other malignant neoplasm of skin: Secondary | ICD-10-CM | POA: Diagnosis not present

## 2020-05-03 DIAGNOSIS — L570 Actinic keratosis: Secondary | ICD-10-CM | POA: Diagnosis not present

## 2020-05-03 DIAGNOSIS — C44712 Basal cell carcinoma of skin of right lower limb, including hip: Secondary | ICD-10-CM | POA: Diagnosis not present

## 2020-05-03 DIAGNOSIS — X32XXXA Exposure to sunlight, initial encounter: Secondary | ICD-10-CM | POA: Diagnosis not present

## 2020-05-03 DIAGNOSIS — D2261 Melanocytic nevi of right upper limb, including shoulder: Secondary | ICD-10-CM | POA: Diagnosis not present

## 2020-05-03 DIAGNOSIS — D2271 Melanocytic nevi of right lower limb, including hip: Secondary | ICD-10-CM | POA: Diagnosis not present

## 2020-05-17 ENCOUNTER — Other Ambulatory Visit: Payer: Self-pay

## 2020-05-17 ENCOUNTER — Ambulatory Visit
Admission: RE | Admit: 2020-05-17 | Discharge: 2020-05-17 | Disposition: A | Discharge status: Home / Self Care | Payer: Medicare PPO | Source: Ambulatory Visit | Attending: Internal Medicine | Admitting: Internal Medicine

## 2020-05-17 DIAGNOSIS — Z1231 Encounter for screening mammogram for malignant neoplasm of breast: Secondary | ICD-10-CM | POA: Diagnosis not present

## 2020-05-24 DIAGNOSIS — C44712 Basal cell carcinoma of skin of right lower limb, including hip: Secondary | ICD-10-CM | POA: Diagnosis not present

## 2020-06-17 DIAGNOSIS — Z01818 Encounter for other preprocedural examination: Secondary | ICD-10-CM | POA: Diagnosis not present

## 2020-06-17 DIAGNOSIS — R0600 Dyspnea, unspecified: Secondary | ICD-10-CM | POA: Diagnosis not present

## 2020-06-30 ENCOUNTER — Other Ambulatory Visit: Payer: Self-pay

## 2020-06-30 ENCOUNTER — Encounter: Payer: Medicare PPO | Attending: Pulmonary Disease

## 2020-06-30 DIAGNOSIS — R0600 Dyspnea, unspecified: Secondary | ICD-10-CM

## 2020-06-30 DIAGNOSIS — R0609 Other forms of dyspnea: Secondary | ICD-10-CM

## 2020-06-30 NOTE — Progress Notes (Signed)
Virtual Visit completed. Patient informed on EP and RD appointment and 6 Minute walk test. Patient also informed of patient health questionnaires on My Chart. Patient Verbalizes understanding. Visit diagnosis can be found in [facility] 06/17/2020. Patient is going to check with her insurance and doctor about her hiatal hernia.

## 2020-07-07 DIAGNOSIS — I10 Essential (primary) hypertension: Secondary | ICD-10-CM | POA: Diagnosis not present

## 2020-07-07 DIAGNOSIS — R0600 Dyspnea, unspecified: Secondary | ICD-10-CM | POA: Diagnosis not present

## 2020-07-07 DIAGNOSIS — Z950 Presence of cardiac pacemaker: Secondary | ICD-10-CM | POA: Diagnosis not present

## 2020-07-20 DIAGNOSIS — I480 Paroxysmal atrial fibrillation: Secondary | ICD-10-CM | POA: Diagnosis not present

## 2020-08-24 DIAGNOSIS — E039 Hypothyroidism, unspecified: Secondary | ICD-10-CM | POA: Diagnosis not present

## 2020-08-24 DIAGNOSIS — K589 Irritable bowel syndrome without diarrhea: Secondary | ICD-10-CM | POA: Diagnosis not present

## 2020-08-24 DIAGNOSIS — E785 Hyperlipidemia, unspecified: Secondary | ICD-10-CM | POA: Diagnosis not present

## 2020-08-24 DIAGNOSIS — N1831 Chronic kidney disease, stage 3a: Secondary | ICD-10-CM | POA: Diagnosis not present

## 2020-08-24 DIAGNOSIS — I5032 Chronic diastolic (congestive) heart failure: Secondary | ICD-10-CM | POA: Diagnosis not present

## 2020-08-24 DIAGNOSIS — I10 Essential (primary) hypertension: Secondary | ICD-10-CM | POA: Diagnosis not present

## 2020-08-24 DIAGNOSIS — M8580 Other specified disorders of bone density and structure, unspecified site: Secondary | ICD-10-CM | POA: Diagnosis not present

## 2020-08-24 DIAGNOSIS — K580 Irritable bowel syndrome with diarrhea: Secondary | ICD-10-CM | POA: Diagnosis not present

## 2020-08-24 DIAGNOSIS — N3281 Overactive bladder: Secondary | ICD-10-CM | POA: Diagnosis not present

## 2020-09-16 DIAGNOSIS — E785 Hyperlipidemia, unspecified: Secondary | ICD-10-CM | POA: Diagnosis not present

## 2020-09-16 DIAGNOSIS — M859 Disorder of bone density and structure, unspecified: Secondary | ICD-10-CM | POA: Diagnosis not present

## 2020-09-16 DIAGNOSIS — Z0000 Encounter for general adult medical examination without abnormal findings: Secondary | ICD-10-CM | POA: Diagnosis not present

## 2020-09-16 DIAGNOSIS — E039 Hypothyroidism, unspecified: Secondary | ICD-10-CM | POA: Diagnosis not present

## 2020-09-16 DIAGNOSIS — R739 Hyperglycemia, unspecified: Secondary | ICD-10-CM | POA: Diagnosis not present

## 2020-09-21 DIAGNOSIS — S80862A Insect bite (nonvenomous), left lower leg, initial encounter: Secondary | ICD-10-CM | POA: Diagnosis not present

## 2020-09-21 DIAGNOSIS — L538 Other specified erythematous conditions: Secondary | ICD-10-CM | POA: Diagnosis not present

## 2020-09-21 DIAGNOSIS — Z85828 Personal history of other malignant neoplasm of skin: Secondary | ICD-10-CM | POA: Diagnosis not present

## 2020-09-21 DIAGNOSIS — D225 Melanocytic nevi of trunk: Secondary | ICD-10-CM | POA: Diagnosis not present

## 2020-09-21 DIAGNOSIS — D2261 Melanocytic nevi of right upper limb, including shoulder: Secondary | ICD-10-CM | POA: Diagnosis not present

## 2020-09-21 DIAGNOSIS — L570 Actinic keratosis: Secondary | ICD-10-CM | POA: Diagnosis not present

## 2020-09-21 DIAGNOSIS — D2271 Melanocytic nevi of right lower limb, including hip: Secondary | ICD-10-CM | POA: Diagnosis not present

## 2020-09-21 DIAGNOSIS — L820 Inflamed seborrheic keratosis: Secondary | ICD-10-CM | POA: Diagnosis not present

## 2020-09-21 DIAGNOSIS — L309 Dermatitis, unspecified: Secondary | ICD-10-CM | POA: Diagnosis not present

## 2020-09-23 DIAGNOSIS — G629 Polyneuropathy, unspecified: Secondary | ICD-10-CM | POA: Diagnosis not present

## 2020-09-23 DIAGNOSIS — R2681 Unsteadiness on feet: Secondary | ICD-10-CM | POA: Diagnosis not present

## 2020-09-23 DIAGNOSIS — R0600 Dyspnea, unspecified: Secondary | ICD-10-CM | POA: Diagnosis not present

## 2020-09-23 DIAGNOSIS — R82998 Other abnormal findings in urine: Secondary | ICD-10-CM | POA: Diagnosis not present

## 2020-09-23 DIAGNOSIS — I10 Essential (primary) hypertension: Secondary | ICD-10-CM | POA: Diagnosis not present

## 2020-09-23 DIAGNOSIS — R490 Dysphonia: Secondary | ICD-10-CM | POA: Diagnosis not present

## 2020-09-23 DIAGNOSIS — Z1212 Encounter for screening for malignant neoplasm of rectum: Secondary | ICD-10-CM | POA: Diagnosis not present

## 2020-09-23 DIAGNOSIS — I129 Hypertensive chronic kidney disease with stage 1 through stage 4 chronic kidney disease, or unspecified chronic kidney disease: Secondary | ICD-10-CM | POA: Diagnosis not present

## 2020-09-23 DIAGNOSIS — Z0000 Encounter for general adult medical examination without abnormal findings: Secondary | ICD-10-CM | POA: Diagnosis not present

## 2020-09-23 DIAGNOSIS — D692 Other nonthrombocytopenic purpura: Secondary | ICD-10-CM | POA: Diagnosis not present

## 2020-09-23 DIAGNOSIS — F3341 Major depressive disorder, recurrent, in partial remission: Secondary | ICD-10-CM | POA: Diagnosis not present

## 2020-09-23 DIAGNOSIS — R6889 Other general symptoms and signs: Secondary | ICD-10-CM | POA: Diagnosis not present

## 2020-09-24 DIAGNOSIS — K921 Melena: Secondary | ICD-10-CM | POA: Diagnosis not present

## 2020-09-24 DIAGNOSIS — I10 Essential (primary) hypertension: Secondary | ICD-10-CM | POA: Diagnosis not present

## 2020-10-22 ENCOUNTER — Encounter: Payer: Self-pay | Admitting: *Deleted

## 2020-10-25 ENCOUNTER — Ambulatory Visit: Payer: Medicare PPO | Admitting: Internal Medicine

## 2020-10-25 ENCOUNTER — Other Ambulatory Visit (INDEPENDENT_AMBULATORY_CARE_PROVIDER_SITE_OTHER): Payer: Medicare PPO

## 2020-10-25 ENCOUNTER — Encounter: Payer: Self-pay | Admitting: Internal Medicine

## 2020-10-25 VITALS — BP 110/60 | HR 96 | Ht 69.5 in | Wt 193.0 lb

## 2020-10-25 DIAGNOSIS — R195 Other fecal abnormalities: Secondary | ICD-10-CM

## 2020-10-25 DIAGNOSIS — K648 Other hemorrhoids: Secondary | ICD-10-CM

## 2020-10-25 DIAGNOSIS — K219 Gastro-esophageal reflux disease without esophagitis: Secondary | ICD-10-CM | POA: Diagnosis not present

## 2020-10-25 DIAGNOSIS — K449 Diaphragmatic hernia without obstruction or gangrene: Secondary | ICD-10-CM | POA: Diagnosis not present

## 2020-10-25 LAB — IBC + FERRITIN
Ferritin: 96.9 ng/mL (ref 10.0–291.0)
Iron: 122 ug/dL (ref 42–145)
Saturation Ratios: 31.5 % (ref 20.0–50.0)
Transferrin: 277 mg/dL (ref 212.0–360.0)

## 2020-10-25 LAB — CBC WITH DIFFERENTIAL/PLATELET
Basophils Absolute: 0 10*3/uL (ref 0.0–0.1)
Basophils Relative: 0.3 % (ref 0.0–3.0)
Eosinophils Absolute: 0.1 10*3/uL (ref 0.0–0.7)
Eosinophils Relative: 1.4 % (ref 0.0–5.0)
HCT: 39.2 % (ref 36.0–46.0)
Hemoglobin: 13.3 g/dL (ref 12.0–15.0)
Lymphocytes Relative: 23.1 % (ref 12.0–46.0)
Lymphs Abs: 1.7 10*3/uL (ref 0.7–4.0)
MCHC: 33.8 g/dL (ref 30.0–36.0)
MCV: 91.7 fl (ref 78.0–100.0)
Monocytes Absolute: 0.6 10*3/uL (ref 0.1–1.0)
Monocytes Relative: 7.6 % (ref 3.0–12.0)
Neutro Abs: 4.9 10*3/uL (ref 1.4–7.7)
Neutrophils Relative %: 67.6 % (ref 43.0–77.0)
Platelets: 173 10*3/uL (ref 150.0–400.0)
RBC: 4.28 Mil/uL (ref 3.87–5.11)
RDW: 13.5 % (ref 11.5–15.5)
WBC: 7.2 10*3/uL (ref 4.0–10.5)

## 2020-10-25 LAB — VITAMIN B12: Vitamin B-12: 474 pg/mL (ref 211–911)

## 2020-10-25 MED ORDER — COLESTIPOL HCL 1 G PO TABS: 2.0000 g | ORAL_TABLET | Freq: Every day | ORAL | 3 refills | Status: DC

## 2020-10-25 MED ORDER — COLESTIPOL HCL 1 G PO TABS: 2.0000 g | ORAL_TABLET | Freq: Every day | ORAL | 0 refills | Status: DC

## 2020-10-25 NOTE — Patient Instructions (Addendum)
Your provider has requested that you go to the basement level for lab work before leaving today. Press "B" on the elevator. The lab is located at the first door on the left as you exit the elevator.  We have sent the following medications to your pharmacy for you to pick up at your convenience: Colestipol 2 grams once daily-- please take this medication at least 2 hours apart from any other medication.  Continue omeprazole twice daily  Please follow up with Dr Hilarie Fredrickson on 01/20/21 at 2:30 pm.  If you are age 40 or older, your body mass index should be between 23-30. Your Body mass index is 28.09 kg/m. If this is out of the aforementioned range listed, please consider follow up with your Primary Care Provider. __________________________________________________________  The [place] [facility] providers would like to encourage you to use [facility] to communicate with providers for non-urgent requests or questions.  Due to long hold times on the telephone, sending your provider a message by [facility] may be a faster and more efficient way to get a response.  Please allow 48 business hours for a response.  Please remember that this is for non-urgent requests.   Due to recent changes in healthcare laws, you may see the results of your imaging and laboratory studies on MyChart before your provider has had a chance to review them.  We understand that in some cases there may be results that are confusing or concerning to you. Not all laboratory results come back in the same time frame and the provider may be waiting for multiple results in order to interpret others.  Please give [place] 48 hours in order for your provider to thoroughly review all the results before contacting the office for clarification of your results.

## 2020-10-25 NOTE — Progress Notes (Signed)
Subjective:    Patient ID: Brandy Golden, female    DOB: [birth year], 75 y.o.   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  She is here alone today.  She reports that she on the whole feels well.  She does deal with loose stools 20 to 30 minutes after every meal.  These can be urgent and she reports needing to always know where the bathroom is.  She carries extra underwear in case of an accident.  She does not see blood in her stool or melena.  There is no abdominal pain.  She reports having no heartburn symptom nor dysphagia.  She takes omeprazole 40 mg twice daily.  She does have at times shortness of breath particularly with exertion and she has been seen by pulmonology.  Cardiopulmonary rehabilitation has been recommended.  She does report issues with prolapsing internal hemorrhoids.  She had heme positive stool on 2 of 3 stool cards when checked by primary care in early July 2022. CMP from 09/16/2020 within normal limits with the exception of slightly elevated BUN at 34 White count 6.5, hemoglobin 13.6, MCV 93.1, platelet count 138  She reports being seen at [facility] to evaluate her hiatal hernia and to consider repair.  She was then referred to [facility] where upper endoscopy and manometry plus pH was performed as a presurgical evaluation.  EGD performed on 05/18/2016.  Lumen of the esophagus was mildly dilated.  Lower third was mildly tortuous.  Large hiatal hernia  present.  Food residue in the gastric fundus within the hiatal hernia.  Evidence of fundoplication in the gastric body with a loose appearing.  Flattening was found in the duodenal bulb and second portion of the duodenum which was biopsied. Ambulatory pH and impedance testing --control of distal esophageal acid was excellent.  Abnormal absolute number of reflux events.  Positive symptom correlation for regurgitation.  Limited study as the probe did not reach the stomach felt secondary to the large hiatal hernia Esophageal manometry --hypotensive UES and LES.  Manometry indicating moderate ineffective esophageal motility.  Review of Systems As per HPI, otherwise negative  Current Medications, Allergies, Past Medical History, Past Surgical History, Family History and Social History were reviewed in [employer] record.    Objective:   Physical Exam BP 110/60   Pulse 96   Ht 5' 9.5" (1.765 m)   Wt 193 lb (87.5 kg)   BMI 28.09 kg/m  Gen: awake, alert, NAD HEENT: anicteric, op clear Abd: soft, NT/ND, +BS throughout Ext: no c/c/e Neuro: nonfocal  See HPI for objective lab work done recently by Dr. Virgina Jock at [facility] colonoscopy reviewed with the patient which I performed in November 2020: Complete exam with good bowel preparation.  Multiple diverticula in the sigmoid, descending and ascending colon.  Internal hemorrhoids.     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  1.  Heme positive stool test --she did have heme positive stool but has had no overt bleeding, blood in stool or melena.  She does have symptomatic hemorrhoids and so this could potentially explain the heme positive stool.  We also discussed her large hiatal hernia and how this is a not uncommon cause of heme positive stool and even IDA.  She has had prior issues with iron deficiency anemia though her hemoglobin was normal recently as was her MCV.  Her colonoscopy was within the last 2 years and normal.  We discussed these findings and at this point I do not feel that repeating the upper endoscopy or colonoscopy is needed.  The hiatal hernia certainly could cause blood loss but without overt anemia or melena there is likely little medically that could be done about this.  She is already on twice daily PPI --Check CBC and iron studies today --Hold on repeat upper endoscopy or colonoscopy at this time  2.  GERD/large recurrent hiatal hernia --she went to [facility] as well as [facility] and has had the necessary preoperative testing but she does not wish to have her hiatal hernia repair.  She is concerned about the risks of surgery and her current age.  She is also not currently symptomatic from GERD or dysphagia.  It is quite possible that some of her dyspnea particularly with exertion may be related to her hiatal hernia.  At this point based on her wishes I have recommended the following: --Continue omeprazole 40 mg twice daily --Monitor blood counts and iron studies as above to ensure the hiatal hernia is not causing an iron deficiency anemia  3.  Chronic loose stools --we will try colestipol 2 g daily, separate this by at least 2 hours from other medications.  4.  Symptomatic internal hemorrhoids --we discussed hemorrhoidal banding today.  She is interested and we will consider this at her follow-up appointment.  2 to 36-monthfollow-up  40 minutes total spent today including patient  facing time, coordination of care, reviewing medical history/procedures/pertinent radiology studies, and documentation of the encounter.

## 2020-11-13 IMAGING — CT CT MAXILLOFACIAL W/O CM
4 of 9 series · 13 of 47 positions shown, 14 images · non-contrast
Comparison: None.

CLINICAL DATA: Facial injury after fall last night.

EXAM:
CT HEAD WITHOUT CONTRAST
CT MAXILLOFACIAL WITHOUT CONTRAST
CT CERVICAL SPINE WITHOUT CONTRAST
TECHNIQUE: Multidetector CT imaging of the head, cervical spine, and
maxillofacial structures were performed using the standard protocol
without intravenous contrast. Multiplanar CT image reconstructions
of the cervical spine and maxillofacial structures were also
generated.

[Series 6: coronal bone · coronal · 0.18mm/px · 2 of 61 slices shown]
[im 7/61  bone]
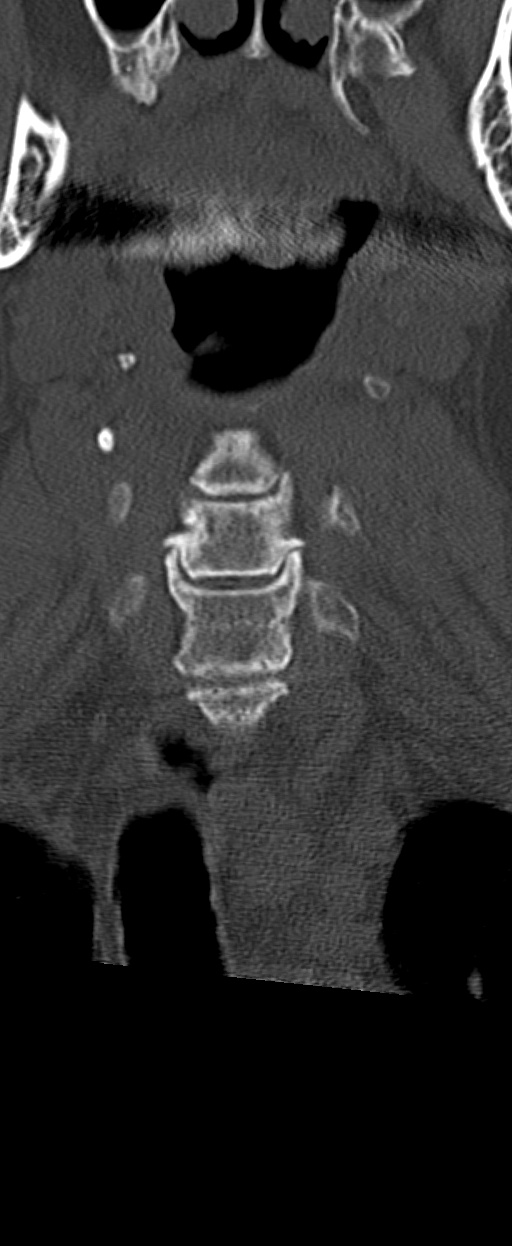
[im 34/61  bone]
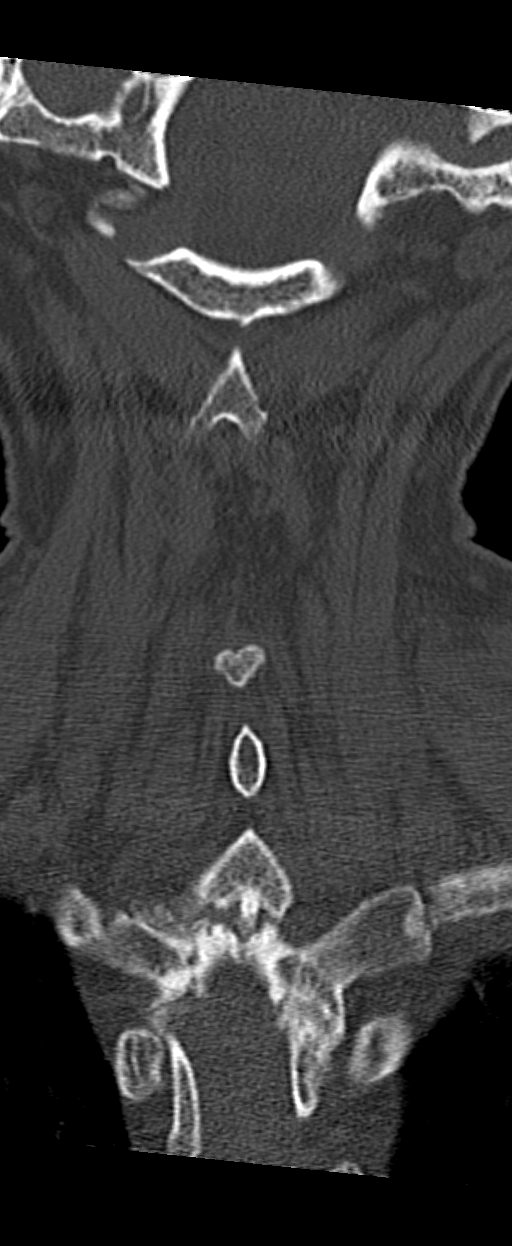

[Series 7: orthogonal axials · axial · 0.18mm/px · z∈[-364,-272]mm · 3 of 110 slices shown, 4 images]
[im 28/110  brain]
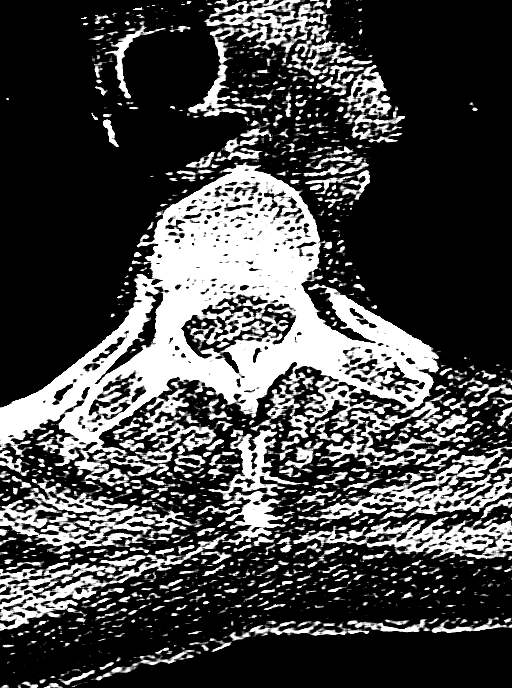
[im 28/110  bone]
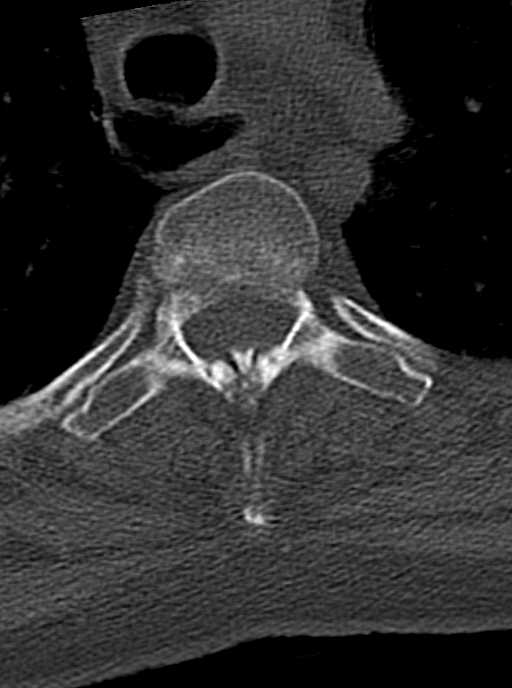
[im 55/110  bone]
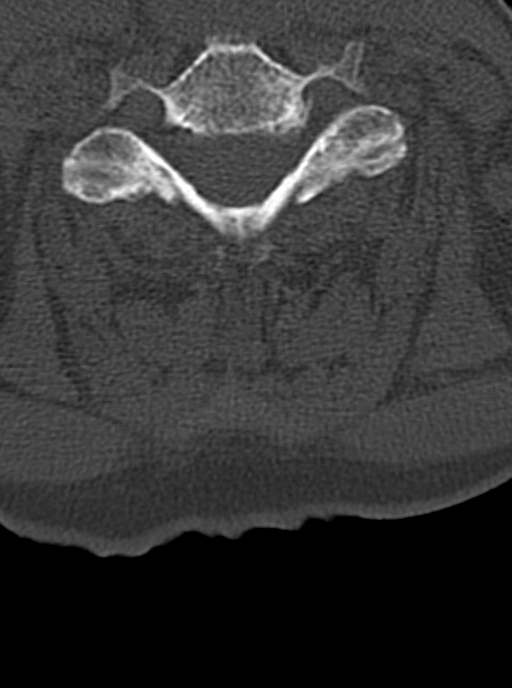
[im 82/110  bone]
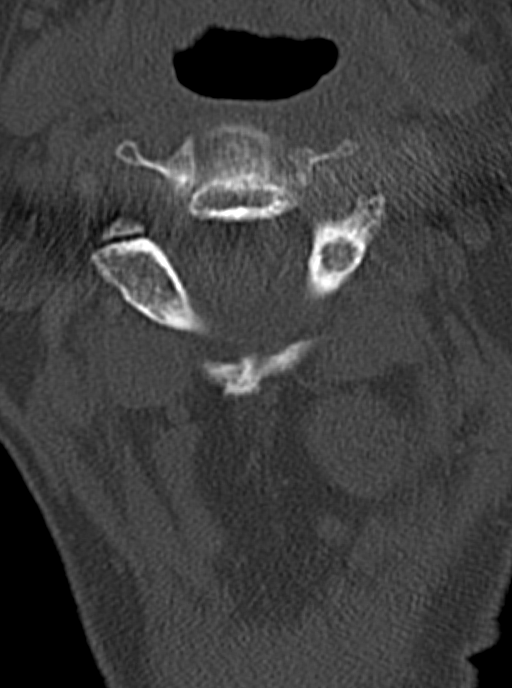

[Series 10: st thins (person_name) · axial · 0.33mm/px · z∈[-296,-168]mm · 7 of 345 slices shown]
[im 22/345  bone]
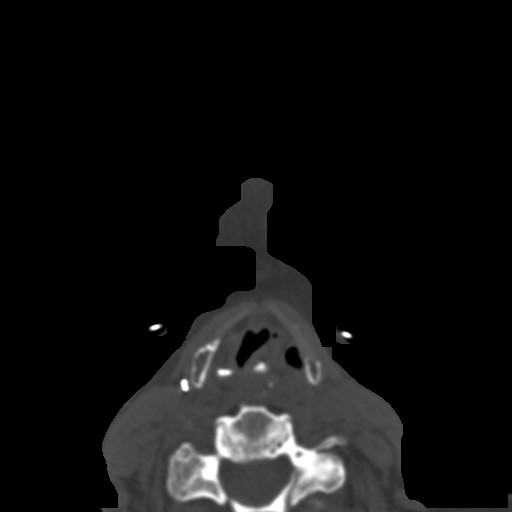
[im 65/345  bone]
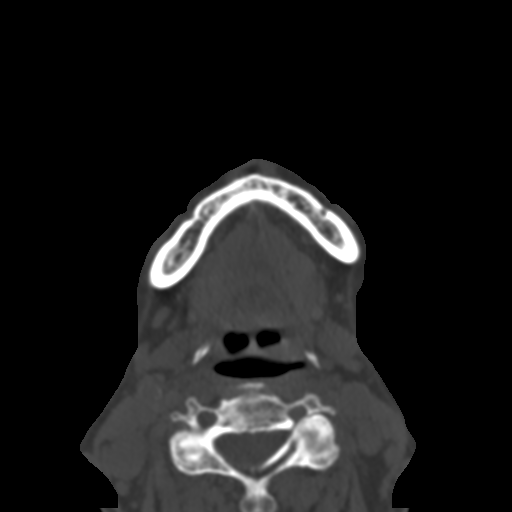
[im 108/345  bone]
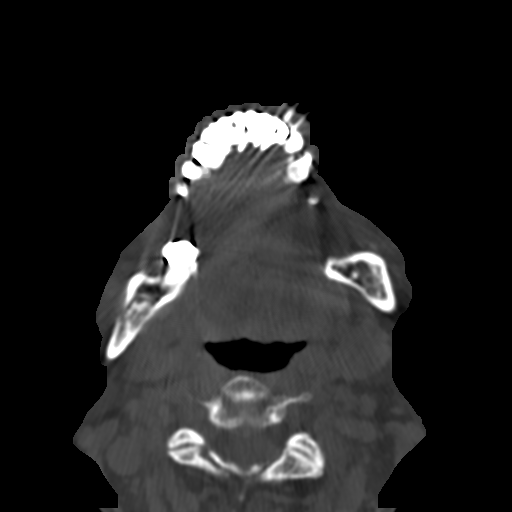
[im 151/345  bone]
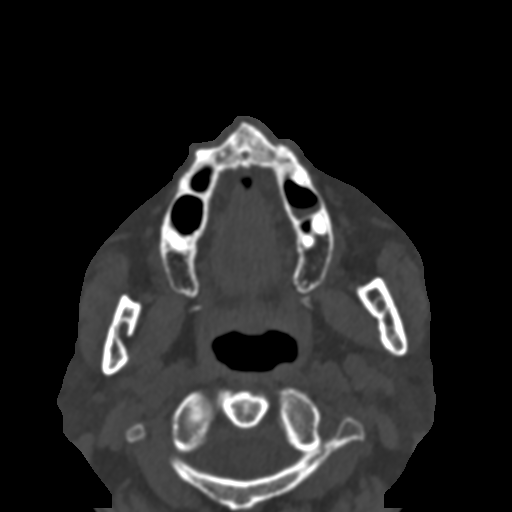
[im 194/345  bone]
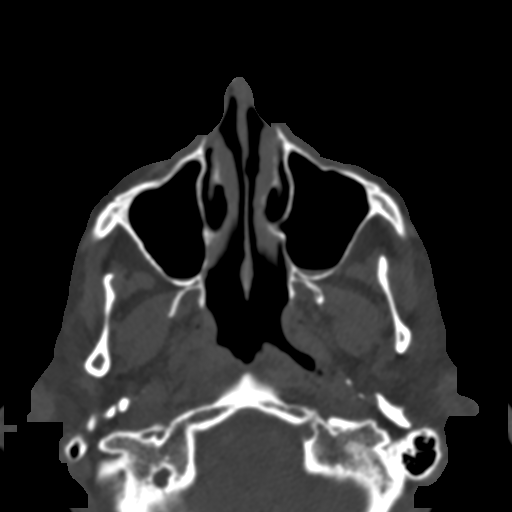
[im 237/345  bone]
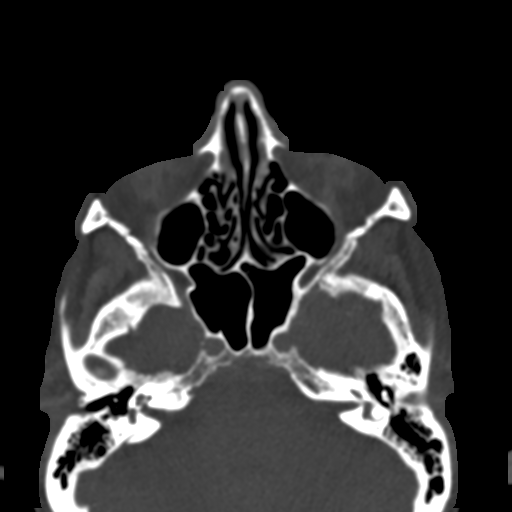
[im 280/345  bone]
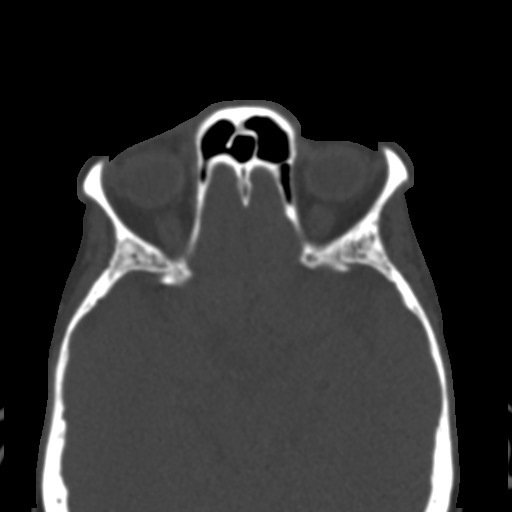

[Series 15: sagittal bone · sagittal · 0.31mm/px · 1 of 84 slices shown]
[im 42/84  bone]
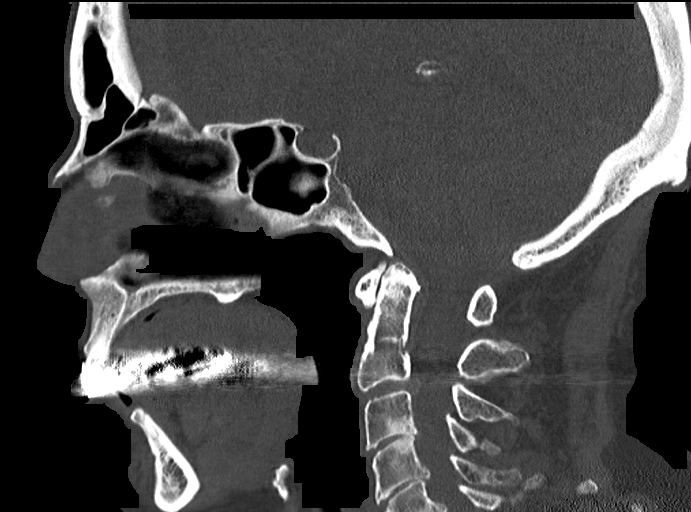

[13 of 47 positions shown; findings below may reference images not displayed]

FINDINGS: CT HEAD FINDINGS

Brain: Mild chronic ischemic white matter disease is noted. No mass
effect or midline shift is noted. Ventricular size is within normal
limits. Probable small subdural hematoma is seen involving the para
falcine region anteriorly. No mass lesion or acute infarction is
noted.

Vascular: No hyperdense vessel or unexpected calcification.

Skull: Normal. Negative for fracture or focal lesion.

Other: Small right frontal scalp hematoma is noted.

CT MAXILLOFACIAL FINDINGS

Osseous: No fracture or mandibular dislocation. No destructive
process.

Orbits: Negative. No traumatic or inflammatory finding.

Sinuses: Clear.

Soft tissues: Right supraorbital soft tissue contusion is noted.

CT CERVICAL SPINE FINDINGS

Alignment: Normal.

Skull base and vertebrae: No acute fracture. No primary bone lesion
or focal pathologic process.

Soft tissues and spinal canal: No prevertebral fluid or swelling. No
visible canal hematoma.

Disc levels: Severe degenerative disc disease is noted at C3-4,
C4-5, C5-6 and C6-7.

Upper chest: Negative.

Other: None.
IMPRESSION: Probable small anterior parafalcine subdural hematoma is noted. No
mass effect or midline shift is noted. Critical Value/emergent
results were called by telephone at the time of interpretation on
03/04/2019 at [DATE] to Ho Song Gadget, who verbally
acknowledged these results.

Small right frontal scalp and supraorbital contusions or hematoma
are present.

No osseous abnormality is noted in maxillofacial region.

Severe multilevel degenerative disc disease is noted in the cervical
spine. No acute fracture or spondylolisthesis is noted.

## 2020-12-13 DIAGNOSIS — I480 Paroxysmal atrial fibrillation: Secondary | ICD-10-CM | POA: Diagnosis not present

## 2020-12-13 DIAGNOSIS — Z23 Encounter for immunization: Secondary | ICD-10-CM | POA: Diagnosis not present

## 2020-12-13 DIAGNOSIS — I10 Essential (primary) hypertension: Secondary | ICD-10-CM | POA: Diagnosis not present

## 2020-12-13 DIAGNOSIS — Z950 Presence of cardiac pacemaker: Secondary | ICD-10-CM | POA: Diagnosis not present

## 2020-12-13 DIAGNOSIS — R0609 Other forms of dyspnea: Secondary | ICD-10-CM | POA: Diagnosis not present

## 2020-12-22 DIAGNOSIS — Z950 Presence of cardiac pacemaker: Secondary | ICD-10-CM | POA: Diagnosis not present

## 2020-12-22 DIAGNOSIS — I10 Essential (primary) hypertension: Secondary | ICD-10-CM | POA: Diagnosis not present

## 2020-12-22 DIAGNOSIS — Z23 Encounter for immunization: Secondary | ICD-10-CM | POA: Diagnosis not present

## 2020-12-22 DIAGNOSIS — R0609 Other forms of dyspnea: Secondary | ICD-10-CM | POA: Diagnosis not present

## 2020-12-22 DIAGNOSIS — I4891 Unspecified atrial fibrillation: Secondary | ICD-10-CM | POA: Diagnosis not present

## 2020-12-30 DIAGNOSIS — Z01818 Encounter for other preprocedural examination: Secondary | ICD-10-CM | POA: Diagnosis not present

## 2020-12-30 DIAGNOSIS — I482 Chronic atrial fibrillation, unspecified: Secondary | ICD-10-CM | POA: Diagnosis not present

## 2020-12-30 DIAGNOSIS — R053 Chronic cough: Secondary | ICD-10-CM | POA: Diagnosis not present

## 2021-01-01 DIAGNOSIS — I4891 Unspecified atrial fibrillation: Secondary | ICD-10-CM

## 2021-01-01 HISTORY — DX: Unspecified atrial fibrillation: I48.91

## 2021-01-10 ENCOUNTER — Other Ambulatory Visit: Payer: Self-pay | Admitting: Internal Medicine

## 2021-01-11 ENCOUNTER — Other Ambulatory Visit: Payer: Self-pay | Admitting: Internal Medicine

## 2021-01-11 DIAGNOSIS — R002 Palpitations: Secondary | ICD-10-CM | POA: Diagnosis not present

## 2021-01-12 DIAGNOSIS — Z23 Encounter for immunization: Secondary | ICD-10-CM | POA: Diagnosis not present

## 2021-01-12 DIAGNOSIS — R0609 Other forms of dyspnea: Secondary | ICD-10-CM | POA: Diagnosis not present

## 2021-01-12 DIAGNOSIS — R002 Palpitations: Secondary | ICD-10-CM | POA: Diagnosis not present

## 2021-01-12 DIAGNOSIS — Z950 Presence of cardiac pacemaker: Secondary | ICD-10-CM | POA: Diagnosis not present

## 2021-01-12 DIAGNOSIS — I10 Essential (primary) hypertension: Secondary | ICD-10-CM | POA: Diagnosis not present

## 2021-01-20 ENCOUNTER — Encounter: Payer: Self-pay | Admitting: Internal Medicine

## 2021-01-20 ENCOUNTER — Ambulatory Visit: Payer: Medicare PPO | Admitting: Internal Medicine

## 2021-01-20 VITALS — BP 112/60 | HR 88 | Ht 69.5 in | Wt 199.1 lb

## 2021-01-20 DIAGNOSIS — K449 Diaphragmatic hernia without obstruction or gangrene: Secondary | ICD-10-CM

## 2021-01-20 DIAGNOSIS — K648 Other hemorrhoids: Secondary | ICD-10-CM | POA: Diagnosis not present

## 2021-01-20 DIAGNOSIS — R195 Other fecal abnormalities: Secondary | ICD-10-CM

## 2021-01-20 DIAGNOSIS — Z862 Personal history of diseases of the blood and blood-forming organs and certain disorders involving the immune mechanism: Secondary | ICD-10-CM | POA: Diagnosis not present

## 2021-01-20 DIAGNOSIS — K219 Gastro-esophageal reflux disease without esophagitis: Secondary | ICD-10-CM

## 2021-01-20 MED ORDER — COLESTIPOL HCL 1 G PO TABS: 2.0000 g | ORAL_TABLET | Freq: Two times a day (BID) | ORAL | 3 refills | Status: DC

## 2021-01-20 MED ORDER — OMEPRAZOLE 40 MG PO CPDR: 40.0000 mg | DELAYED_RELEASE_CAPSULE | Freq: Two times a day (BID) | ORAL | 3 refills | Status: DC

## 2021-01-20 NOTE — Patient Instructions (Addendum)
If you are age 77 or older, your body mass index should be between 23-30. Your Body mass index is 28.98 kg/m. If this is out of the aforementioned range listed, please consider follow up with your Primary Care Provider. ________________________________________________________  The [place] [facility] providers would like to encourage you to use [facility] to communicate with providers for non-urgent requests or questions.  Due to long hold times on the telephone, sending your provider a message by [facility] may be a faster and more efficient way to get a response.  Please allow 48 business hours for a response.  Please remember that this is for non-urgent requests.   Be sure to Increase your Colestipol to 2 mg (2 tablets) twice daily.   Continue Omeprazole 40 mg 1 capsule twice daily.  ** Be sure to have your Primary care to have labs for your blood count and iron panel drawn every 6 months.  Call the office to schedule a 1 year follow up.

## 2021-01-21 ENCOUNTER — Encounter: Payer: Self-pay | Admitting: Internal Medicine

## 2021-01-21 NOTE — Progress Notes (Signed)
Subjective:    Patient ID: Brandy Golden, female    DOB: [birth year], 75 y.o.   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  She is here alone today and I last saw her on 10/25/2020.  She reports she is significantly better her loose stools have resolved.  There is no further fecal urgency and she no longer fears traveling or being out of the house.  She has been using colestipol 1 g twice daily though reports it is somewhat cumbersome with dose timing.  She wonders if she can consolidate this dose.  She has had no further issues with prolapsing hemorrhoids.  No blood in stool or melena.  No abdominal pain.  No dysphagia.  2 to 3 weeks ago she was started on Eliquis after she developed paroxysmal atrial fibrillation.  She saw Dr. Saralyn Pilar in [place] with cardiology.   Review of Systems As per HPI, otherwise negative  Current Medications, Allergies, Past Medical History, Past Surgical History, Family History and Social History were reviewed in [employer] record.    Objective:   Physical Exam BP 112/60   Pulse 88   Ht 5' 9.5" (1.765 m)   Wt 199 lb 2 oz (90.3 kg)   BMI 28.98 kg/m  Gen: awake, alert, NAD HEENT: anicteric Neuro: nonfocal  CBC    Component Value Date/Time   WBC 7.2 10/25/2020 1024   RBC 4.28 10/25/2020 1024   HGB 13.3 10/25/2020 1024   HCT 39.2 10/25/2020 1024   PLT 173.0 10/25/2020 1024   MCV 91.7 10/25/2020 1024   MCH 29.7 03/08/2019 0601   MCHC 33.8 10/25/2020 1024   RDW 13.5 10/25/2020 1024   LYMPHSABS 1.7 10/25/2020 1024    MONOABS 0.6 10/25/2020 1024   EOSABS 0.1 10/25/2020 1024   BASOSABS 0.0 10/25/2020 1024   Iron/TIBC/Ferritin/ %Sat    Component Value Date/Time   IRON 122 10/25/2020 1024   FERRITIN 96.9 10/25/2020 1024   IRONPCTSAT 31.5 10/25/2020 1024       Assessment & Plan:  75 year old female with a history of GERD, hiatal hernia status post remote Nissen in 2011, recurrent hiatal hernia due to a slipped fundoplication, history of multinodular goiter and 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  Chronic loose stools --resolved with colestipol.  She can certainly consolidate the dose and take 2 g once daily.  She should still separate the 2 g dose by 2 hours on either side of other medications. --Continue colestipol 2 g daily  2.  History of heme positive stool --blood counts and iron studies have been normal.  There is no evidence for ongoing GI bleed.  She is up-to-date with upper endoscopy and colonoscopy.  We did discuss with initiation of Eliquis that her hiatal hernia may place her at some additional risk for bleeding into the GI tract as well as IDA.  I do not deem her high enough risk to not remain on anticoagulation.  I would recommend  routinely following blood counts and iron studies about every 6 months with primary care. --CBC, IBC plus ferritin about every 6 months per primary care  3.  GERD with history of large hiatal hernia --asymptomatic.  She has been seen at [facility] as well as [facility] and at this point does not wish to pursue redo hiatal hernia repair.  This is reasonable. --Continue omeprazole 40 mg twice daily --Routine monitoring of blood counts and iron studies as above  4.  Hemorrhoids --no longer symptomatic since loose stools improved as a #1.  No treatment needed for this  condition at this time.  Follow-up in 1 year with me  30 minutes total spent today including patient facing time, coordination of care, reviewing medical history/procedures/pertinent radiology studies, and documentation of the encounter.

## 2021-01-25 ENCOUNTER — Other Ambulatory Visit: Payer: Self-pay

## 2021-01-25 ENCOUNTER — Encounter: Payer: Medicare PPO | Attending: Pulmonary Disease | Admitting: *Deleted

## 2021-01-25 DIAGNOSIS — R0609 Other forms of dyspnea: Secondary | ICD-10-CM

## 2021-01-25 NOTE — Progress Notes (Signed)
Virtual orientation call completed today. shehas an appointment on Date: 02/10/2021  for EP eval and gym Orientation.  Documentation of diagnosis can be found in [facility] Date: 12/30/2020 .

## 2021-02-10 ENCOUNTER — Encounter: Payer: Medicare PPO | Attending: Pulmonary Disease

## 2021-02-10 ENCOUNTER — Other Ambulatory Visit: Payer: Self-pay

## 2021-02-10 VITALS — Ht 70.5 in | Wt 200.8 lb

## 2021-02-10 DIAGNOSIS — R0609 Other forms of dyspnea: Secondary | ICD-10-CM | POA: Insufficient documentation

## 2021-02-10 NOTE — Progress Notes (Signed)
Pulmonary Individual Treatment Plan  Patient Details  Name: Brandy Golden MRN: [id number] Date of Birth: 14-Aug-1945 Referring Provider:   Flowsheet Row Pulmonary Rehab from 02/10/2021 in [facility] Cardiac and Pulmonary Rehab  Referring Provider Aleskerov       Initial Encounter Date:  Flowsheet Row Pulmonary Rehab from 02/10/2021 in [facility] Cardiac and Pulmonary Rehab  Date 02/10/21       Visit Diagnosis: DOE (dyspnea on exertion)  Patient's Home Medications on Admission:  Current Outpatient Medications:    ALPRAZolam (XANAX) 0.5 MG tablet, Take 0.5 mg by mouth 2 (two) times daily as needed for anxiety or sleep., Disp: , Rfl:    Biotin 5 MG CAPS, Take 5 mg by mouth 2 (two) times daily., Disp: , Rfl:    Ca Carbonate-Mag Hydroxide (ROLAIDS PO), Take 2 each by mouth daily as needed (for acid reflux)., Disp: , Rfl:    Calcium Carb-Cholecalciferol (CALCIUM + VITAMIN D3 PO), Take 2 tablets by mouth 2 (two) times daily., Disp: , Rfl:    Cholecalciferol (VITAMIN D3) 1000 units CAPS, Take 1,000 Units by mouth daily., Disp: , Rfl:    colestipol (COLESTID) 1 g tablet, Take 2 tablets (2 g total) by mouth 2 (two) times daily., Disp: 180 tablet, Rfl: 3   ELIQUIS 5 MG TABS tablet, Take 5 mg by mouth 2 (two) times daily., Disp: , Rfl:    fluticasone (FLONASE) 50 MCG/ACT nasal spray, TAKE 2 SPRAYS INTO EACH NOSTRIL AT BEDTIME, Disp: , Rfl:    lisinopril (PRINIVIL,ZESTRIL) 10 MG tablet, Take 10 mg by mouth daily., Disp: , Rfl:    Multiple Vitamin (MULTI-VITAMIN DAILY PO), Take 1 tablet by mouth daily., Disp: , Rfl:    omeprazole (PRILOSEC) 40 MG capsule, Take 1 capsule (40 mg total) by mouth 2 (two) times daily before a meal., Disp: 180 capsule, Rfl: 3   sertraline (ZOLOFT) 100 MG tablet, Take 100 mg by mouth daily., Disp: , Rfl:    sodium fluoride (FLUORISHIELD) 1.1 % GEL dental gel, Place onto teeth., Disp: , Rfl:    SYNTHROID 125 MCG tablet, Take 125 mcg by mouth daily before breakfast. , Disp: , Rfl:     vitamin C (ASCORBIC ACID) 500 MG tablet, Take 1,000 mg by mouth daily. , Disp: , Rfl:    zinc gluconate 50 MG tablet, Take 50 mg by mouth daily., Disp: , Rfl:   Past Medical History: Past Medical History:  Diagnosis Date   Allergy    Anemia, iron deficiency    Anxiety    Atrial fibrillation ([place]) 01/2021   Benign essential tremor    Blood transfusion without reported diagnosis    Cancer ([place])    bacal cell nd squmous cell skin cancers   Cataract    Chronic kidney disease    Colon polyps    Depression    Diverticulosis    Dysphonia    Dysrhythmia    Fibrocystic Breast disease    pt denies   Gastric ulcer 2006   Gastrointestinal hemorrhage, hx of    GERD (gastroesophageal reflux disease)    History of COVID-19    History of hiatal hernia    Hx of basal cell carcinoma    and squamous cell skin cancer   Hyperlipidemia    Hypertension    Hypothyroidism    IBS (irritable bowel syndrome)    Internal hemorrhoids without mention of complication    LBBB (left bundle branch block)    Osteoarthritis    Overactive bladder  Stroke [facility]) 03/2018   Unspecified hypothyroidism    Vitamin D deficiency     Tobacco Use: Social History   Tobacco Use  Smoking Status Never  Smokeless Tobacco Never    Labs: Recent Review Flowsheet Data   There is no flowsheet data to display.      Pulmonary Assessment Scores:  Pulmonary Assessment Scores     Row Name 02/10/21 1628         ADL UCSD   SOB Score total 74     Rest 0     Walk 3     Stairs 5     Bath 3     Dress 3     Shop 3       CAT Score   CAT Score 21       mMRC Score   mMRC Score 1              UCSD: Self-administered rating of dyspnea associated with activities of daily living (ADLs) 6-point scale (0 = "not at all" to 5 = "maximal or unable to do because of breathlessness")  Scoring Scores range from 0 to 120.  Minimally important difference is 5 units  CAT: CAT can identify the health  impairment of COPD patients and is better correlated with disease progression.  CAT has a scoring range of zero to 40. The CAT score is classified into four groups of low (less than 10), medium (10 - 20), high (21-30) and very high (31-40) based on the impact level of disease on health status. A CAT score over 10 suggests significant symptoms.  A worsening CAT score could be explained by an exacerbation, poor medication adherence, poor inhaler technique, or progression of COPD or comorbid conditions.  CAT MCID is 2 points  mMRC: mMRC (Modified Medical Research Council) Dyspnea Scale is used to assess the degree of baseline functional disability in patients of respiratory disease due to dyspnea. No minimal important difference is established. A decrease in score of 1 point or greater is considered a positive change.   Pulmonary Function Assessment:   Exercise Target Goals: Exercise Program Goal: Individual exercise prescription set using results from initial 6 min walk test and THRR while considering  patient's activity barriers and safety.   Exercise Prescription Goal: Initial exercise prescription builds to 30-45 minutes a day of aerobic activity, 2-3 days per week.  Home exercise guidelines will be given to patient during program as part of exercise prescription that the participant will acknowledge.  Education: Aerobic Exercise: - Group verbal and visual presentation on the components of exercise prescription. Introduces F.I.T.T principle from ACSM for exercise prescriptions.  Reviews F.I.T.T. principles of aerobic exercise including progression. Written material given at graduation.   Education: Resistance Exercise: - Group verbal and visual presentation on the components of exercise prescription. Introduces F.I.T.T principle from ACSM for exercise prescriptions  Reviews F.I.T.T. principles of resistance exercise including progression. Written material given at graduation.    Education:  Exercise & Equipment Safety: - Individual verbal instruction and demonstration of equipment use and safety with use of the equipment. Flowsheet Row Pulmonary Rehab from 02/10/2021 in [facility] Cardiac and Pulmonary Rehab  Date 02/10/21  Educator AS  Instruction Review Code 1- Verbalizes Understanding       Education: Exercise Physiology & General Exercise Guidelines: - Group verbal and written instruction with models to review the exercise physiology of the cardiovascular system and associated critical values. Provides general exercise guidelines with specific guidelines to those  with heart or lung disease.    Education: Flexibility, Balance, Mind/Body Relaxation: - Group verbal and visual presentation with interactive activity on the components of exercise prescription. Introduces F.I.T.T principle from ACSM for exercise prescriptions. Reviews F.I.T.T. principles of flexibility and balance exercise training including progression. Also discusses the mind body connection.  Reviews various relaxation techniques to help reduce and manage stress (i.e. Deep breathing, progressive muscle relaxation, and visualization). Balance handout provided to take home. Written material given at graduation.   Activity Barriers & Risk Stratification:  Activity Barriers & Cardiac Risk Stratification - 01/25/21 0941       Activity Barriers & Cardiac Risk Stratification   Activity Barriers Muscular Weakness;Shortness of Breath             6 Minute Walk:  6 Minute Walk     Row Name 02/10/21 1616         6 Minute Walk   Phase Initial     Distance 862 feet     Walk Time 6 minutes     # of Rest Breaks 0     MPH 1.63     METS 1.9     RPE 11     Perceived Dyspnea  2     VO2 Peak 6.6     Symptoms Yes (comment)     Comments SOB     Resting HR 98 bpm     Resting BP 122/70     Resting Oxygen Saturation  96 %     Exercise Oxygen Saturation  during 6 min walk 94 %     Max Ex. HR 112 bpm     Max Ex. BP  134/74     2 Minute Post BP 114/66       Interval HR   1 Minute HR 96     2 Minute HR 107     3 Minute HR 109     4 Minute HR 107     5 Minute HR 109     6 Minute HR 112     2 Minute Post HR 87     Interval Heart Rate? Yes       Interval Oxygen   Interval Oxygen? Yes     Baseline Oxygen Saturation % 96 %     1 Minute Oxygen Saturation % 94 %     1 Minute Liters of Oxygen 0 L     2 Minute Oxygen Saturation % 94 %     2 Minute Liters of Oxygen 0 L     3 Minute Oxygen Saturation % 95 %     3 Minute Liters of Oxygen 0 L     4 Minute Oxygen Saturation % 95 %     4 Minute Liters of Oxygen 0 L     5 Minute Oxygen Saturation % 94 %     5 Minute Liters of Oxygen 0 L     6 Minute Oxygen Saturation % 94 %     6 Minute Liters of Oxygen 0 L     2 Minute Post Oxygen Saturation % 96 %     2 Minute Post Liters of Oxygen 0 L             Oxygen Initial Assessment:  Oxygen Initial Assessment - 01/25/21 0944       Home Oxygen   Home Oxygen Device None    Sleep Oxygen Prescription None    Home Exercise Oxygen Prescription None  Home Resting Oxygen Prescription None    Compliance with Home Oxygen Use Yes      Intervention   Short Term Goals To learn and demonstrate proper use of respiratory medications;To learn and demonstrate proper pursed lip breathing techniques or other breathing techniques. ;To learn and understand importance of monitoring SPO2 with pulse oximeter and demonstrate accurate use of the pulse oximeter.    Long  Term Goals Compliance with respiratory medication;Exhibits proper breathing techniques, such as pursed lip breathing or other method taught during program session;Demonstrates proper use of MDI's             Oxygen Re-Evaluation:   Oxygen Discharge (Final Oxygen Re-Evaluation):   Initial Exercise Prescription:  Initial Exercise Prescription - 02/10/21 1600       Date of Initial Exercise RX and Referring Provider   Date 02/10/21    Referring  Provider Aleskerov      Oxygen   Maintain Oxygen Saturation 88% or higher      Treadmill   MPH 1.3    Grade 0    Minutes 15    METs 2      NuStep   Level 1    SPM 80    Minutes 15    METs 1.9      REL-XR   Level 1    Speed 50    Minutes 15    METs 1.9      Track   Laps 16    Minutes 15    METs 1.9      Prescription Details   Frequency (times per week) 3    Duration Progress to 30 minutes of continuous aerobic without signs/symptoms of physical distress      Intensity   THRR 40-80% of Max Heartrate 116-134    Ratings of Perceived Exertion 11-15    Perceived Dyspnea 0-4      Resistance Training   Training Prescription Yes    Weight 3 lb    Reps 10-15             Perform Capillary Blood Glucose checks as needed.  Exercise Prescription Changes:   Exercise Prescription Changes     Row Name 02/10/21 1600             Response to Exercise   Blood Pressure (Admit) 122/70       Blood Pressure (Exercise) 134/74       Blood Pressure (Exit) 114/66       Heart Rate (Admit) 98 bpm       Heart Rate (Exercise) 112 bpm       Heart Rate (Exit) 87 bpm       Oxygen Saturation (Admit) 96 %       Oxygen Saturation (Exercise) 94 %       Oxygen Saturation (Exit) 96 %       Rating of Perceived Exertion (Exercise) 11       Perceived Dyspnea (Exercise) 2       Symptoms SOB                Exercise Comments:   Exercise Goals and Review:   Exercise Goals     Row Name 02/10/21 1625             Exercise Goals   Increase Physical Activity Yes       Intervention Provide advice, education, support and counseling about physical activity/exercise needs.;Develop an individualized exercise prescription for aerobic and resistive training based on initial  evaluation findings, risk stratification, comorbidities and participant's personal goals.       Expected Outcomes Short Term: Attend rehab on a regular basis to increase amount of physical activity.;Long Term:  Add in home exercise to make exercise part of routine and to increase amount of physical activity.;Long Term: Exercising regularly at least 3-5 days a week.       Increase Strength and Stamina Yes       Intervention Provide advice, education, support and counseling about physical activity/exercise needs.;Develop an individualized exercise prescription for aerobic and resistive training based on initial evaluation findings, risk stratification, comorbidities and participant's personal goals.       Expected Outcomes Short Term: Increase workloads from initial exercise prescription for resistance, speed, and METs.;Short Term: Perform resistance training exercises routinely during rehab and add in resistance training at home;Long Term: Improve cardiorespiratory fitness, muscular endurance and strength as measured by increased METs and functional capacity (6MWT)       Able to understand and use rate of perceived exertion (RPE) scale Yes       Intervention Provide education and explanation on how to use RPE scale       Expected Outcomes Short Term: Able to use RPE daily in rehab to express subjective intensity level;Long Term:  Able to use RPE to guide intensity level when exercising independently       Able to understand and use Dyspnea scale Yes       Intervention Provide education and explanation on how to use Dyspnea scale       Expected Outcomes Short Term: Able to use Dyspnea scale daily in rehab to express subjective sense of shortness of breath during exertion;Long Term: Able to use Dyspnea scale to guide intensity level when exercising independently       Knowledge and understanding of Target Heart Rate Range (THRR) Yes       Intervention Provide education and explanation of THRR including how the numbers were predicted and where they are located for reference       Expected Outcomes Short Term: Able to state/look up THRR;Short Term: Able to use daily as guideline for intensity in rehab;Long Term:  Able to use THRR to govern intensity when exercising independently       Able to check pulse independently Yes       Intervention Provide education and demonstration on how to check pulse in carotid and radial arteries.;Review the importance of being able to check your own pulse for safety during independent exercise       Expected Outcomes Short Term: Able to explain why pulse checking is important during independent exercise;Long Term: Able to check pulse independently and accurately       Understanding of Exercise Prescription Yes       Intervention Provide education, explanation, and written materials on patient's individual exercise prescription       Expected Outcomes Short Term: Able to explain program exercise prescription;Long Term: Able to explain home exercise prescription to exercise independently                Exercise Goals Re-Evaluation :   Discharge Exercise Prescription (Final Exercise Prescription Changes):  Exercise Prescription Changes - 02/10/21 1600       Response to Exercise   Blood Pressure (Admit) 122/70    Blood Pressure (Exercise) 134/74    Blood Pressure (Exit) 114/66    Heart Rate (Admit) 98 bpm    Heart Rate (Exercise) 112 bpm    Heart Rate (  Exit) 87 bpm    Oxygen Saturation (Admit) 96 %    Oxygen Saturation (Exercise) 94 %    Oxygen Saturation (Exit) 96 %    Rating of Perceived Exertion (Exercise) 11    Perceived Dyspnea (Exercise) 2    Symptoms SOB             Nutrition:  Target Goals: Understanding of nutrition guidelines, daily intake of sodium 1500mg , cholesterol 200mg , calories 30% from fat and 7% or less from saturated fats, daily to have 5 or more servings of fruits and vegetables.  Education: All About Nutrition: -Group instruction provided by verbal, written material, interactive activities, discussions, models, and posters to present general guidelines for heart healthy nutrition including fat, fiber, MyPlate, the role of sodium  in heart healthy nutrition, utilization of the nutrition label, and utilization of this knowledge for meal planning. Follow up email sent as well. Written material given at graduation.   Biometrics:  Pre Biometrics - 02/10/21 1625       Pre Biometrics   Height 5' 10.5" (1.791 m)    Weight 200 lb 12.8 oz (91.1 kg)    BMI (Calculated) 28.39    Single Leg Stand 3.18 seconds              Nutrition Therapy Plan and Nutrition Goals:   Nutrition Assessments:  MEDIFICTS Score Key: ?70 Need to make dietary changes  40-70 Heart Healthy Diet ? 40 Therapeutic Level Cholesterol Diet  Flowsheet Row Pulmonary Rehab from 02/10/2021 in [facility] Cardiac and Pulmonary Rehab  Picture Your Plate Total Score on Admission 61      Picture Your Plate Scores: <09 Unhealthy dietary pattern with much room for improvement. 41-50 Dietary pattern unlikely to meet recommendations for good health and room for improvement. 51-60 More healthful dietary pattern, with some room for improvement.  >60 Healthy dietary pattern, although there may be some specific behaviors that could be improved.   Nutrition Goals Re-Evaluation:   Nutrition Goals Discharge (Final Nutrition Goals Re-Evaluation):   Psychosocial: Target Goals: Acknowledge presence or absence of significant depression and/or stress, maximize coping skills, provide positive support system. Participant is able to verbalize types and ability to use techniques and skills needed for reducing stress and depression.   Education: Stress, Anxiety, and Depression - Group verbal and visual presentation to define topics covered.  Reviews how body is impacted by stress, anxiety, and depression.  Also discusses healthy ways to reduce stress and to treat/manage anxiety and depression.  Written material given at graduation.   Education: Sleep Hygiene -Provides group verbal and written instruction about how sleep can affect your health.  Define sleep hygiene,  discuss sleep cycles and impact of sleep habits. Review good sleep hygiene tips.    Initial Review & Psychosocial Screening:  Initial Psych Review & Screening - 01/25/21 0949       Initial Review   Current issues with Current Psychotropic Meds;Current Stress Concerns    Source of Stress Concerns Unable to perform yard/household activities    Comments Stress unable to do ususal chores.  Takes more energy for ADLs      Family Dynamics   Good Support System? Yes   Family, friends church family   Comments Husband is up and doing, not back to normal.  Had multiple surgeries, infection in foot.      Barriers   Psychosocial barriers to participate in program There are no identifiable barriers or psychosocial needs.      Screening Interventions  Interventions Encouraged to exercise;To provide support and resources with identified psychosocial needs;Provide feedback about the scores to participant    Expected Outcomes Short Term goal: Utilizing psychosocial counselor, staff and physician to assist with identification of specific Stressors or current issues interfering with healing process. Setting desired goal for each stressor or current issue identified.;Long Term Goal: Stressors or current issues are controlled or eliminated.;Short Term goal: Identification and review with participant of any Quality of Life or Depression concerns found by scoring the questionnaire.;Long Term goal: The participant improves quality of Life and PHQ9 Scores as seen by post scores and/or verbalization of changes             Quality of Life Scores:  Scores of 19 and below usually indicate a poorer quality of life in these areas.  A difference of  2-3 points is a clinically meaningful difference.  A difference of 2-3 points in the total score of the Quality of Life Index has been associated with significant improvement in overall quality of life, self-image, physical symptoms, and general health in studies  assessing change in quality of life.  PHQ-9: Recent Review Flowsheet Data     Depression screen [facility] 2/9 02/10/2021   Decreased Interest 1   Down, Depressed, Hopeless 0   PHQ - 2 Score 1   Altered sleeping 3    Tired, decreased energy 3   Change in appetite 2    Feeling bad or failure about yourself  3   Trouble concentrating 0   Moving slowly or fidgety/restless 2   Suicidal thoughts 0   PHQ-9 Score 14   Difficult doing work/chores Somewhat difficult      Interpretation of Total Score  Total Score Depression Severity:  1-4 = Minimal depression, 5-9 = Mild depression, 10-14 = Moderate depression, 15-19 = Moderately severe depression, 20-27 = Severe depression   Psychosocial Evaluation and Intervention:  Psychosocial Evaluation - 01/25/21 1008       Psychosocial Evaluation & Interventions   Interventions Encouraged to exercise with the program and follow exercise prescription    Comments Brandy Golden had delayed starting the program secondary to her husband requiring surgery and a recovery from infection. She is ready now and looking forward to joining the program. She lives with her husband and has family nearby. She wants to get back to her ability to exercise and is hopeful this will improve her ability to get back to some of her activities that have been hard to accomplish.  She had COVID early 2020.  She feels some of her SOB may be from that infection.  She should do well,as she is ready to get started.    Expected Outcomes STG: Brandy Golden is able to attend all scheduled sessions, progress with her exercise. LTG Brandy Golden continues her exercise progression after discharge and has ability to complete some of the activies that she could not complete before attending the program    Continue Psychosocial Services  Follow up required by staff             Psychosocial Re-Evaluation:   Psychosocial Discharge (Final Psychosocial Re-Evaluation):   Education: Education Goals: Education  classes will be provided on a weekly basis, covering required topics. Participant will state understanding/return demonstration of topics presented.  Learning Barriers/Preferences:  Learning Barriers/Preferences - 01/25/21 5701       Learning Barriers/Preferences   Learning Barriers None    Learning Preferences None             General  Pulmonary Education Topics:  Infection Prevention: - Provides verbal and written material to individual with discussion of infection control including proper hand washing and proper equipment cleaning during exercise session. Flowsheet Row Pulmonary Rehab from 02/10/2021 in [facility] Cardiac and Pulmonary Rehab  Date 02/10/21  Educator AS  Instruction Review Code 1- Verbalizes Understanding       Falls Prevention: - Provides verbal and written material to individual with discussion of falls prevention and safety. Flowsheet Row Pulmonary Rehab from 02/10/2021 in [facility] Cardiac and Pulmonary Rehab  Date 02/10/21  Educator AS  Instruction Review Code 1- Verbalizes Understanding       Chronic Lung Disease Review: - Group verbal instruction with posters, models, PowerPoint presentations and videos,  to review new updates, new respiratory medications, new advancements in procedures and treatments. Providing information on websites and "800" numbers for continued self-education. Includes information about supplement oxygen, available portable oxygen systems, continuous and intermittent flow rates, oxygen safety, concentrators, and Medicare reimbursement for oxygen. Explanation of Pulmonary Drugs, including class, frequency, complications, importance of spacers, rinsing mouth after steroid MDI's, and proper cleaning methods for nebulizers. Review of basic lung anatomy and physiology related to function, structure, and complications of lung disease. Review of risk factors. Discussion about methods for diagnosing sleep apnea and types of masks and machines for  OSA. Includes a review of the use of types of environmental controls: home humidity, furnaces, filters, dust mite/pet prevention, HEPA vacuums. Discussion about weather changes, air quality and the benefits of nasal washing. Instruction on Warning signs, infection symptoms, calling MD promptly, preventive modes, and value of vaccinations. Review of effective airway clearance, coughing and/or vibration techniques. Emphasizing that all should Create an Action Plan. Written material given at graduation. Flowsheet Row Pulmonary Rehab from 02/10/2021 in [facility] Cardiac and Pulmonary Rehab  Education need identified 02/10/21       AED/CPR: - Group verbal and written instruction with the use of models to demonstrate the basic use of the AED with the basic ABC's of resuscitation.    Anatomy and Cardiac Procedures: - Group verbal and visual presentation and models provide information about basic cardiac anatomy and function. Reviews the testing methods done to diagnose heart disease and the outcomes of the test results. Describes the treatment choices: Medical Management, Angioplasty, or Coronary Bypass Surgery for treating various heart conditions including Myocardial Infarction, Angina, Valve Disease, and Cardiac Arrhythmias.  Written material given at graduation.   Medication Safety: - Group verbal and visual instruction to review commonly prescribed medications for heart and lung disease. Reviews the medication, class of the drug, and side effects. Includes the steps to properly store meds and maintain the prescription regimen.  Written material given at graduation.   Other: -Provides group and verbal instruction on various topics (see comments)   Knowledge Questionnaire Score:  Knowledge Questionnaire Score - 02/10/21 1628       Knowledge Questionnaire Score   Pre Score 15/18              Core Components/Risk Factors/Patient Goals at Admission:  Personal Goals and Risk Factors at  Admission - 02/10/21 1630       Core Components/Risk Factors/Patient Goals on Admission    Weight Management Yes;Weight Loss    Intervention Weight Management: Develop a combined nutrition and exercise program designed to reach desired caloric intake, while maintaining appropriate intake of nutrient and fiber, sodium and fats, and appropriate energy expenditure required for the weight goal.;Weight Management: Provide education and appropriate resources to help  participant work on and attain dietary goals.    Admit Weight 200 lb 12.8 oz (91.1 kg)    Goal Weight: Short Term 195 lb (88.5 kg)    Goal Weight: Long Term 190 lb (86.2 kg)    Expected Outcomes Short Term: Continue to assess and modify interventions until short term weight is achieved;Long Term: Adherence to nutrition and physical activity/exercise program aimed toward attainment of established weight goal;Weight Loss: Understanding of general recommendations for a balanced deficit meal plan, which promotes 1-2 lb weight loss per week and includes a negative energy balance of [phone] kcal/d    Improve shortness of breath with ADL's Yes    Intervention Provide education, individualized exercise plan and daily activity instruction to help decrease symptoms of SOB with activities of daily living.    Expected Outcomes Short Term: Improve cardiorespiratory fitness to achieve a reduction of symptoms when performing ADLs;Long Term: Be able to perform more ADLs without symptoms or delay the onset of symptoms    Increase knowledge of respiratory medications and ability to use respiratory devices properly  Yes    Intervention Provide education and demonstration as needed of appropriate use of medications, inhalers, and oxygen therapy.    Expected Outcomes Short Term: Achieves understanding of medications use. Understands that oxygen is a medication prescribed by physician. Demonstrates appropriate use of inhaler and oxygen therapy.;Long Term: Maintain  appropriate use of medications, inhalers, and oxygen therapy.    Hypertension Yes    Intervention Provide education on lifestyle modifcations including regular physical activity/exercise, weight management, moderate sodium restriction and increased consumption of fresh fruit, vegetables, and low fat dairy, alcohol moderation, and smoking cessation.;Monitor prescription use compliance.    Expected Outcomes Short Term: Continued assessment and intervention until BP is < 140/52mm HG in hypertensive participants. < 130/25mm HG in hypertensive participants with diabetes, heart failure or chronic kidney disease.;Long Term: Maintenance of blood pressure at goal levels.             Education:Diabetes - Individual verbal and written instruction to review signs/symptoms of diabetes, desired ranges of glucose level fasting, after meals and with exercise. Acknowledge that pre and post exercise glucose checks will be done for 3 sessions at entry of program.   Know Your Numbers and Heart Failure: - Group verbal and visual instruction to discuss disease risk factors for cardiac and pulmonary disease and treatment options.  Reviews associated critical values for Overweight/Obesity, Hypertension, Cholesterol, and Diabetes.  Discusses basics of heart failure: signs/symptoms and treatments.  Introduces Heart Failure Zone chart for action plan for heart failure.  Written material given at graduation.   Core Components/Risk Factors/Patient Goals Review:    Core Components/Risk Factors/Patient Goals at Discharge (Final Review):    ITP Comments:  ITP Comments     Row Name 01/25/21 1017           ITP Comments Virtual orientation call completed today. shehas an appointment on Date: 02/10/2021  for EP eval and gym Orientation.  Documentation of diagnosis can be found in [facility] Date: 12/30/2020 .                Comments: initial ITP

## 2021-02-10 NOTE — Patient Instructions (Signed)
Patient Instructions  Patient Details  Name: Brandy Golden MRN: [id number] Date of Birth: [birth year] Referring Provider:  Ottie Glazier, MD  Below are your personal goals for exercise, nutrition, and risk factors. Our goal is to help you stay on track towards obtaining and maintaining these goals. We will be discussing your progress on these goals with you throughout the program.  Initial Exercise Prescription:  Initial Exercise Prescription - 02/10/21 1600       Date of Initial Exercise RX and Referring Provider   Date 02/10/21    Referring Provider Aleskerov      Oxygen   Maintain Oxygen Saturation 88% or higher      Treadmill   MPH 1.3    Grade 0    Minutes 15    METs 2      NuStep   Level 1    SPM 80    Minutes 15    METs 1.9      REL-XR   Level 1    Speed 50    Minutes 15    METs 1.9      Track   Laps 16    Minutes 15    METs 1.9      Prescription Details   Frequency (times per week) 3    Duration Progress to 30 minutes of continuous aerobic without signs/symptoms of physical distress      Intensity   THRR 40-80% of Max Heartrate 116-134    Ratings of Perceived Exertion 11-15    Perceived Dyspnea 0-4      Resistance Training   Training Prescription Yes    Weight 3 lb    Reps 10-15             Exercise Goals: Frequency: Be able to perform aerobic exercise two to three times per week in program working toward 2-5 days per week of home exercise.  Intensity: Work with a perceived exertion of 11 (fairly light) - 15 (hard) while following your exercise prescription.  We will make changes to your prescription with you as you progress through the program.   Duration: Be able to do 30 to 45 minutes of continuous aerobic exercise in addition to a 5 minute warm-up and a 5 minute cool-down routine.   Nutrition Goals: Your personal nutrition goals will be established when you do your nutrition analysis with the dietician.  The following are general  nutrition guidelines to follow: Cholesterol < 200mg /day Sodium < 1500mg /day Fiber: Women over 50 21 grams per day  Personal Goals:  Personal Goals and Risk Factors at Admission - 02/10/21 1630       Core Components/Risk Factors/Patient Goals on Admission    Weight Management Yes;Weight Loss    Intervention Weight Management: Develop a combined nutrition and exercise program designed to reach desired caloric intake, while maintaining appropriate intake of nutrient and fiber, sodium and fats, and appropriate energy expenditure required for the weight goal.;Weight Management: Provide education and appropriate resources to help participant work on and attain dietary goals.    Admit Weight 200 lb 12.8 oz (91.1 kg)    Goal Weight: Short Term 195 lb (88.5 kg)    Goal Weight: Long Term 190 lb (86.2 kg)    Expected Outcomes Short Term: Continue to assess and modify interventions until short term weight is achieved;Long Term: Adherence to nutrition and physical activity/exercise program aimed toward attainment of established weight goal;Weight Loss: Understanding of general recommendations for a balanced deficit meal plan, which promotes  1-2 lb weight loss per week and includes a negative energy balance of [phone] kcal/d    Improve shortness of breath with ADL's Yes    Intervention Provide education, individualized exercise plan and daily activity instruction to help decrease symptoms of SOB with activities of daily living.    Expected Outcomes Short Term: Improve cardiorespiratory fitness to achieve a reduction of symptoms when performing ADLs;Long Term: Be able to perform more ADLs without symptoms or delay the onset of symptoms    Increase knowledge of respiratory medications and ability to use respiratory devices properly  Yes    Intervention Provide education and demonstration as needed of appropriate use of medications, inhalers, and oxygen therapy.    Expected Outcomes Short Term: Achieves  understanding of medications use. Understands that oxygen is a medication prescribed by physician. Demonstrates appropriate use of inhaler and oxygen therapy.;Long Term: Maintain appropriate use of medications, inhalers, and oxygen therapy.    Hypertension Yes    Intervention Provide education on lifestyle modifcations including regular physical activity/exercise, weight management, moderate sodium restriction and increased consumption of fresh fruit, vegetables, and low fat dairy, alcohol moderation, and smoking cessation.;Monitor prescription use compliance.    Expected Outcomes Short Term: Continued assessment and intervention until BP is < 140/20mm HG in hypertensive participants. < 130/24mm HG in hypertensive participants with diabetes, heart failure or chronic kidney disease.;Long Term: Maintenance of blood pressure at goal levels.             Tobacco Use Initial Evaluation: Social History   Tobacco Use  Smoking Status Never  Smokeless Tobacco Never    Exercise Goals and Review:  Exercise Goals     Row Name 02/10/21 1625             Exercise Goals   Increase Physical Activity Yes       Intervention Provide advice, education, support and counseling about physical activity/exercise needs.;Develop an individualized exercise prescription for aerobic and resistive training based on initial evaluation findings, risk stratification, comorbidities and participant's personal goals.       Expected Outcomes Short Term: Attend rehab on a regular basis to increase amount of physical activity.;Long Term: Add in home exercise to make exercise part of routine and to increase amount of physical activity.;Long Term: Exercising regularly at least 3-5 days a week.       Increase Strength and Stamina Yes       Intervention Provide advice, education, support and counseling about physical activity/exercise needs.;Develop an individualized exercise prescription for aerobic and resistive training based  on initial evaluation findings, risk stratification, comorbidities and participant's personal goals.       Expected Outcomes Short Term: Increase workloads from initial exercise prescription for resistance, speed, and METs.;Short Term: Perform resistance training exercises routinely during rehab and add in resistance training at home;Long Term: Improve cardiorespiratory fitness, muscular endurance and strength as measured by increased METs and functional capacity (6MWT)       Able to understand and use rate of perceived exertion (RPE) scale Yes       Intervention Provide education and explanation on how to use RPE scale       Expected Outcomes Short Term: Able to use RPE daily in rehab to express subjective intensity level;Long Term:  Able to use RPE to guide intensity level when exercising independently       Able to understand and use Dyspnea scale Yes       Intervention Provide education and explanation on how to use  Dyspnea scale       Expected Outcomes Short Term: Able to use Dyspnea scale daily in rehab to express subjective sense of shortness of breath during exertion;Long Term: Able to use Dyspnea scale to guide intensity level when exercising independently       Knowledge and understanding of Target Heart Rate Range (THRR) Yes       Intervention Provide education and explanation of THRR including how the numbers were predicted and where they are located for reference       Expected Outcomes Short Term: Able to state/look up THRR;Short Term: Able to use daily as guideline for intensity in rehab;Long Term: Able to use THRR to govern intensity when exercising independently       Able to check pulse independently Yes       Intervention Provide education and demonstration on how to check pulse in carotid and radial arteries.;Review the importance of being able to check your own pulse for safety during independent exercise       Expected Outcomes Short Term: Able to explain why pulse checking is  important during independent exercise;Long Term: Able to check pulse independently and accurately       Understanding of Exercise Prescription Yes       Intervention Provide education, explanation, and written materials on patient's individual exercise prescription       Expected Outcomes Short Term: Able to explain program exercise prescription;Long Term: Able to explain home exercise prescription to exercise independently                Copy of goals given to participant.

## 2021-02-15 ENCOUNTER — Other Ambulatory Visit: Payer: Self-pay

## 2021-02-15 DIAGNOSIS — R0609 Other forms of dyspnea: Secondary | ICD-10-CM | POA: Diagnosis not present

## 2021-02-15 NOTE — Progress Notes (Signed)
Daily Session Note  Patient Details  Name: Brandy Golden MRN: [id number] Date of Birth: 10-Aug-1945 Referring Provider:   April Manson Pulmonary Rehab from 02/10/2021 in [facility] Cardiac and Pulmonary Rehab  Referring Provider Lanney Gins       Encounter Date: 02/15/2021  Check In:  Session Check In - 02/15/21 0944       Check-In   Supervising physician immediately available to respond to emergencies See telemetry face sheet for immediately available ER MD    Location [facility]-Cardiac & Pulmonary Rehab    Staff Present Birdie Sons, MPA, RN;Jessica Luan Pulling, MA, RCEP, CCRP, CCET;Amanda Sommer, BA, ACSM CEP, Exercise Physiologist    Virtual Visit No    Medication changes reported     No    Fall or balance concerns reported    No    Warm-up and Cool-down Performed on first and last piece of equipment    Resistance Training Performed Yes    VAD Patient? No    PAD/SET Patient? No      Pain Assessment   Currently in Pain? No/denies                Social History   Tobacco Use  Smoking Status Never  Smokeless Tobacco Never    Goals Met:  Independence with exercise equipment Exercise tolerated well No report of concerns or symptoms today Strength training completed today  Goals Unmet:  Not Applicable  Comments: First full day of exercise!  Patient was oriented to gym and equipment including functions, settings, policies, and procedures.  Patient's individual exercise prescription and treatment plan were reviewed.  All starting workloads were established based on the results of the 6 minute walk test done at initial orientation visit.  The plan for exercise progression was also introduced and progression will be customized based on patient's performance and goals.    Dr. Emily Filbert is Medical Director for [place].  Dr. Ottie Glazier is Medical Director for [facility] Pulmonary Rehabilitation.

## 2021-02-17 ENCOUNTER — Other Ambulatory Visit: Payer: Self-pay

## 2021-02-17 DIAGNOSIS — R0609 Other forms of dyspnea: Secondary | ICD-10-CM

## 2021-02-17 NOTE — Progress Notes (Signed)
Daily Session Note  Patient Details  Name: Brandy Golden MRN: [id number] Date of Birth: May 01, 1945 Referring Provider:   April Manson Pulmonary Rehab from 02/10/2021 in [facility] Cardiac and Pulmonary Rehab  Referring Provider Lanney Gins       Encounter Date: 02/17/2021  Check In:  Session Check In - 02/17/21 0948       Check-In   Supervising physician immediately available to respond to emergencies See telemetry face sheet for immediately available ER MD    Location [facility]-Cardiac & Pulmonary Rehab    Staff Present Birdie Sons, MPA, Elveria Rising, BA, ACSM CEP, Exercise Physiologist;Ramirez Fullbright Amedeo Plenty, BS, ACSM CEP, Exercise Physiologist;Kara Eliezer Bottom, MS, ASCM CEP, Exercise Physiologist    Virtual Visit No    Medication changes reported     No    Fall or balance concerns reported    No    Warm-up and Cool-down Performed on first and last piece of equipment    Resistance Training Performed Yes    VAD Patient? No    PAD/SET Patient? No      Pain Assessment   Currently in Pain? No/denies                Social History   Tobacco Use  Smoking Status Never  Smokeless Tobacco Never    Goals Met:  Independence with exercise equipment Exercise tolerated well No report of concerns or symptoms today Strength training completed today  Goals Unmet:  Not Applicable  Comments: Pt able to follow exercise prescription today without complaint.  Will continue to monitor for progression.    Dr. Emily Filbert is Medical Director for [place].  Dr. Ottie Glazier is Medical Director for [facility] Pulmonary Rehabilitation.

## 2021-02-22 ENCOUNTER — Other Ambulatory Visit: Payer: Self-pay

## 2021-02-22 DIAGNOSIS — R0609 Other forms of dyspnea: Secondary | ICD-10-CM

## 2021-02-22 NOTE — Progress Notes (Signed)
Daily Session Note  Patient Details  Name: STORY CONTI MRN: [id number] Date of Birth: 1945-04-14 Referring Provider:   April Manson Pulmonary Rehab from 02/10/2021 in [facility] Cardiac and Pulmonary Rehab  Referring Provider Lanney Gins       Encounter Date: 02/22/2021  Check In:  Session Check In - 02/22/21 0953       Check-In   Supervising physician immediately available to respond to emergencies See telemetry face sheet for immediately available ER MD    Location [facility]-Cardiac & Pulmonary Rehab    Staff Present Birdie Sons, MPA, RN;Amanda Oletta Darter, BA, ACSM CEP, Exercise Physiologist;Melissa Caiola, RDN, LDN    Virtual Visit No    Medication changes reported     No    Fall or balance concerns reported    No    Warm-up and Cool-down Performed on first and last piece of equipment    Resistance Training Performed Yes    VAD Patient? No    PAD/SET Patient? No      Pain Assessment   Currently in Pain? No/denies                Social History   Tobacco Use  Smoking Status Never  Smokeless Tobacco Never    Goals Met:  Independence with exercise equipment Exercise tolerated well No report of concerns or symptoms today Strength training completed today  Goals Unmet:  Not Applicable  Comments: Pt able to follow exercise prescription today without complaint.  Will continue to monitor for progression.    Dr. Emily Filbert is Medical Director for [place].  Dr. Ottie Glazier is Medical Director for [facility] Pulmonary Rehabilitation.

## 2021-02-23 ENCOUNTER — Other Ambulatory Visit: Payer: Self-pay

## 2021-02-23 DIAGNOSIS — R0609 Other forms of dyspnea: Secondary | ICD-10-CM

## 2021-02-23 NOTE — Progress Notes (Signed)
Completed initial RD consultation ?

## 2021-03-01 ENCOUNTER — Other Ambulatory Visit: Payer: Self-pay

## 2021-03-01 DIAGNOSIS — R0609 Other forms of dyspnea: Secondary | ICD-10-CM

## 2021-03-01 NOTE — Progress Notes (Signed)
Daily Session Note  Patient Details  Name: Brandy Golden MRN: [id number] Date of Birth: 30-Sep-1945 Referring Provider:   April Manson Pulmonary Rehab from 02/10/2021 in [facility] Cardiac and Pulmonary Rehab  Referring Provider Lanney Gins       Encounter Date: 03/01/2021  Check In:  Session Check In - 03/01/21 0955       Check-In   Supervising physician immediately available to respond to emergencies See telemetry face sheet for immediately available ER MD    Location [facility]-Cardiac & Pulmonary Rehab    Staff Present Alberteen Sam, MA, RCEP, CCRP, CCET;Amanda Sommer, [place], ACSM CEP, Exercise Physiologist    Virtual Visit No    Medication changes reported     No    Fall or balance concerns reported    No    Warm-up and Cool-down Performed on first and last piece of equipment    Resistance Training Performed Yes    VAD Patient? No    PAD/SET Patient? No      Pain Assessment   Currently in Pain? No/denies                Social History   Tobacco Use  Smoking Status Never  Smokeless Tobacco Never    Goals Met:  Independence with exercise equipment Exercise tolerated well No report of concerns or symptoms today Strength training completed today  Goals Unmet:  Not Applicable  Comments: Pt able to follow exercise prescription today without complaint.  Will continue to monitor for progression.    Dr. Emily Filbert is Medical Director for [place].  Dr. Ottie Glazier is Medical Director for [facility] Pulmonary Rehabilitation.

## 2021-03-02 ENCOUNTER — Encounter: Payer: Self-pay | Admitting: *Deleted

## 2021-03-02 DIAGNOSIS — R0609 Other forms of dyspnea: Secondary | ICD-10-CM

## 2021-03-02 NOTE — Progress Notes (Signed)
Pulmonary Individual Treatment Plan  Patient Details  Name: Brandy Golden MRN: [id number] Date of Birth: 14-Aug-1945 Referring Provider:   Flowsheet Row Pulmonary Rehab from 02/10/2021 in [facility] Cardiac and Pulmonary Rehab  Referring Provider Aleskerov       Initial Encounter Date:  Flowsheet Row Pulmonary Rehab from 02/10/2021 in [facility] Cardiac and Pulmonary Rehab  Date 02/10/21       Visit Diagnosis: DOE (dyspnea on exertion)  Patient's Home Medications on Admission:  Current Outpatient Medications:    ALPRAZolam (XANAX) 0.5 MG tablet, Take 0.5 mg by mouth 2 (two) times daily as needed for anxiety or sleep., Disp: , Rfl:    Biotin 5 MG CAPS, Take 5 mg by mouth 2 (two) times daily., Disp: , Rfl:    Ca Carbonate-Mag Hydroxide (ROLAIDS PO), Take 2 each by mouth daily as needed (for acid reflux)., Disp: , Rfl:    Calcium Carb-Cholecalciferol (CALCIUM + VITAMIN D3 PO), Take 2 tablets by mouth 2 (two) times daily., Disp: , Rfl:    Cholecalciferol (VITAMIN D3) 1000 units CAPS, Take 1,000 Units by mouth daily., Disp: , Rfl:    colestipol (COLESTID) 1 g tablet, Take 2 tablets (2 g total) by mouth 2 (two) times daily., Disp: 180 tablet, Rfl: 3   ELIQUIS 5 MG TABS tablet, Take 5 mg by mouth 2 (two) times daily., Disp: , Rfl:    fluticasone (FLONASE) 50 MCG/ACT nasal spray, TAKE 2 SPRAYS INTO EACH NOSTRIL AT BEDTIME, Disp: , Rfl:    lisinopril (PRINIVIL,ZESTRIL) 10 MG tablet, Take 10 mg by mouth daily., Disp: , Rfl:    Multiple Vitamin (MULTI-VITAMIN DAILY PO), Take 1 tablet by mouth daily., Disp: , Rfl:    omeprazole (PRILOSEC) 40 MG capsule, Take 1 capsule (40 mg total) by mouth 2 (two) times daily before a meal., Disp: 180 capsule, Rfl: 3   sertraline (ZOLOFT) 100 MG tablet, Take 100 mg by mouth daily., Disp: , Rfl:    sodium fluoride (FLUORISHIELD) 1.1 % GEL dental gel, Place onto teeth., Disp: , Rfl:    SYNTHROID 125 MCG tablet, Take 125 mcg by mouth daily before breakfast. , Disp: , Rfl:     vitamin C (ASCORBIC ACID) 500 MG tablet, Take 1,000 mg by mouth daily. , Disp: , Rfl:    zinc gluconate 50 MG tablet, Take 50 mg by mouth daily., Disp: , Rfl:   Past Medical History: Past Medical History:  Diagnosis Date   Allergy    Anemia, iron deficiency    Anxiety    Atrial fibrillation ([place]) 01/2021   Benign essential tremor    Blood transfusion without reported diagnosis    Cancer ([place])    bacal cell nd squmous cell skin cancers   Cataract    Chronic kidney disease    Colon polyps    Depression    Diverticulosis    Dysphonia    Dysrhythmia    Fibrocystic Breast disease    pt denies   Gastric ulcer 2006   Gastrointestinal hemorrhage, hx of    GERD (gastroesophageal reflux disease)    History of COVID-19    History of hiatal hernia    Hx of basal cell carcinoma    and squamous cell skin cancer   Hyperlipidemia    Hypertension    Hypothyroidism    IBS (irritable bowel syndrome)    Internal hemorrhoids without mention of complication    LBBB (left bundle branch block)    Osteoarthritis    Overactive bladder  Stroke [facility]) 03/2018   Unspecified hypothyroidism    Vitamin D deficiency     Tobacco Use: Social History   Tobacco Use  Smoking Status Never  Smokeless Tobacco Never    Labs: Recent Review Flowsheet Data   There is no flowsheet data to display.      Pulmonary Assessment Scores:  Pulmonary Assessment Scores     Row Name 02/10/21 1628         ADL UCSD   SOB Score total 74     Rest 0     Walk 3     Stairs 5     Bath 3     Dress 3     Shop 3       CAT Score   CAT Score 21       mMRC Score   mMRC Score 1              UCSD: Self-administered rating of dyspnea associated with activities of daily living (ADLs) 6-point scale (0 = "not at all" to 5 = "maximal or unable to do because of breathlessness")  Scoring Scores range from 0 to 120.  Minimally important difference is 5 units  CAT: CAT can identify the health  impairment of COPD patients and is better correlated with disease progression.  CAT has a scoring range of zero to 40. The CAT score is classified into four groups of low (less than 10), medium (10 - 20), high (21-30) and very high (31-40) based on the impact level of disease on health status. A CAT score over 10 suggests significant symptoms.  A worsening CAT score could be explained by an exacerbation, poor medication adherence, poor inhaler technique, or progression of COPD or comorbid conditions.  CAT MCID is 2 points  mMRC: mMRC (Modified Medical Research Council) Dyspnea Scale is used to assess the degree of baseline functional disability in patients of respiratory disease due to dyspnea. No minimal important difference is established. A decrease in score of 1 point or greater is considered a positive change.   Pulmonary Function Assessment:   Exercise Target Goals: Exercise Program Goal: Individual exercise prescription set using results from initial 6 min walk test and THRR while considering  patient's activity barriers and safety.   Exercise Prescription Goal: Initial exercise prescription builds to 30-45 minutes a day of aerobic activity, 2-3 days per week.  Home exercise guidelines will be given to patient during program as part of exercise prescription that the participant will acknowledge.  Education: Aerobic Exercise: - Group verbal and visual presentation on the components of exercise prescription. Introduces F.I.T.T principle from ACSM for exercise prescriptions.  Reviews F.I.T.T. principles of aerobic exercise including progression. Written material given at graduation.   Education: Resistance Exercise: - Group verbal and visual presentation on the components of exercise prescription. Introduces F.I.T.T principle from ACSM for exercise prescriptions  Reviews F.I.T.T. principles of resistance exercise including progression. Written material given at graduation.    Education:  Exercise & Equipment Safety: - Individual verbal instruction and demonstration of equipment use and safety with use of the equipment. Flowsheet Row Pulmonary Rehab from 02/17/2021 in [facility] Cardiac and Pulmonary Rehab  Date 02/10/21  Educator AS  Instruction Review Code 1- Verbalizes Understanding       Education: Exercise Physiology & General Exercise Guidelines: - Group verbal and written instruction with models to review the exercise physiology of the cardiovascular system and associated critical values. Provides general exercise guidelines with specific guidelines to those  with heart or lung disease.    Education: Flexibility, Balance, Mind/Body Relaxation: - Group verbal and visual presentation with interactive activity on the components of exercise prescription. Introduces F.I.T.T principle from ACSM for exercise prescriptions. Reviews F.I.T.T. principles of flexibility and balance exercise training including progression. Also discusses the mind body connection.  Reviews various relaxation techniques to help reduce and manage stress (i.e. Deep breathing, progressive muscle relaxation, and visualization). Balance handout provided to take home. Written material given at graduation.   Activity Barriers & Risk Stratification:  Activity Barriers & Cardiac Risk Stratification - 01/25/21 0941       Activity Barriers & Cardiac Risk Stratification   Activity Barriers Muscular Weakness;Shortness of Breath             6 Minute Walk:  6 Minute Walk     Row Name 02/10/21 1616         6 Minute Walk   Phase Initial     Distance 862 feet     Walk Time 6 minutes     # of Rest Breaks 0     MPH 1.63     METS 1.9     RPE 11     Perceived Dyspnea  2     VO2 Peak 6.6     Symptoms Yes (comment)     Comments SOB     Resting HR 98 bpm     Resting BP 122/70     Resting Oxygen Saturation  96 %     Exercise Oxygen Saturation  during 6 min walk 94 %     Max Ex. HR 112 bpm     Max Ex. BP  134/74     2 Minute Post BP 114/66       Interval HR   1 Minute HR 96     2 Minute HR 107     3 Minute HR 109     4 Minute HR 107     5 Minute HR 109     6 Minute HR 112     2 Minute Post HR 87     Interval Heart Rate? Yes       Interval Oxygen   Interval Oxygen? Yes     Baseline Oxygen Saturation % 96 %     1 Minute Oxygen Saturation % 94 %     1 Minute Liters of Oxygen 0 L     2 Minute Oxygen Saturation % 94 %     2 Minute Liters of Oxygen 0 L     3 Minute Oxygen Saturation % 95 %     3 Minute Liters of Oxygen 0 L     4 Minute Oxygen Saturation % 95 %     4 Minute Liters of Oxygen 0 L     5 Minute Oxygen Saturation % 94 %     5 Minute Liters of Oxygen 0 L     6 Minute Oxygen Saturation % 94 %     6 Minute Liters of Oxygen 0 L     2 Minute Post Oxygen Saturation % 96 %     2 Minute Post Liters of Oxygen 0 L             Oxygen Initial Assessment:  Oxygen Initial Assessment - 02/10/21 1711       Home Oxygen   Home Oxygen Device None    Sleep Oxygen Prescription None    Home Exercise Oxygen Prescription None  Home Resting Oxygen Prescription None    Compliance with Home Oxygen Use Yes      Initial 6 min Walk   Oxygen Used None      Program Oxygen Prescription   Program Oxygen Prescription None      Intervention   Short Term Goals To learn and demonstrate proper use of respiratory medications;To learn and demonstrate proper pursed lip breathing techniques or other breathing techniques. ;To learn and understand importance of monitoring SPO2 with pulse oximeter and demonstrate accurate use of the pulse oximeter.    Long  Term Goals Compliance with respiratory medication;Exhibits proper breathing techniques, such as pursed lip breathing or other method taught during program session;Demonstrates proper use of MDI's             Oxygen Re-Evaluation:  Oxygen Re-Evaluation     Row Name 02/15/21 0948             Program Oxygen Prescription   Program  Oxygen Prescription None         Home Oxygen   Home Oxygen Device None       Sleep Oxygen Prescription None       Home Exercise Oxygen Prescription None       Home Resting Oxygen Prescription None       Compliance with Home Oxygen Use Yes         Goals/Expected Outcomes   Short Term Goals To learn and demonstrate proper pursed lip breathing techniques or other breathing techniques. ;To learn and understand importance of monitoring SPO2 with pulse oximeter and demonstrate accurate use of the pulse oximeter.       Long  Term Goals Compliance with respiratory medication;Exhibits proper breathing techniques, such as pursed lip breathing or other method taught during program session       Comments Reviewed PLB technique with pt.  Talked about how it works and it's importance in maintaining their exercise saturations.       Goals/Expected Outcomes Short: Become more profiecient at using PLB.   Long: Become independent at using PLB.                Oxygen Discharge (Final Oxygen Re-Evaluation):  Oxygen Re-Evaluation - 02/15/21 0948       Program Oxygen Prescription   Program Oxygen Prescription None      Home Oxygen   Home Oxygen Device None    Sleep Oxygen Prescription None    Home Exercise Oxygen Prescription None    Home Resting Oxygen Prescription None    Compliance with Home Oxygen Use Yes      Goals/Expected Outcomes   Short Term Goals To learn and demonstrate proper pursed lip breathing techniques or other breathing techniques. ;To learn and understand importance of monitoring SPO2 with pulse oximeter and demonstrate accurate use of the pulse oximeter.    Long  Term Goals Compliance with respiratory medication;Exhibits proper breathing techniques, such as pursed lip breathing or other method taught during program session    Comments Reviewed PLB technique with pt.  Talked about how it works and it's importance in maintaining their exercise saturations.    Goals/Expected Outcomes  Short: Become more profiecient at using PLB.   Long: Become independent at using PLB.             Initial Exercise Prescription:  Initial Exercise Prescription - 02/10/21 1600       Date of Initial Exercise RX and Referring Provider   Date 02/10/21    Referring Provider  Aleskerov      Oxygen   Maintain Oxygen Saturation 88% or higher      Treadmill   MPH 1.3    Grade 0    Minutes 15    METs 2      NuStep   Level 1    SPM 80    Minutes 15    METs 1.9      REL-XR   Level 1    Speed 50    Minutes 15    METs 1.9      Track   Laps 16    Minutes 15    METs 1.9      Prescription Details   Frequency (times per week) 3    Duration Progress to 30 minutes of continuous aerobic without signs/symptoms of physical distress      Intensity   THRR 40-80% of Max Heartrate 116-134    Ratings of Perceived Exertion 11-15    Perceived Dyspnea 0-4      Resistance Training   Training Prescription Yes    Weight 3 lb    Reps 10-15             Perform Capillary Blood Glucose checks as needed.  Exercise Prescription Changes:   Exercise Prescription Changes     Row Name 02/10/21 1600 02/28/21 1000           Response to Exercise   Blood Pressure (Admit) 122/70 112/62      Blood Pressure (Exercise) 134/74 134/68      Blood Pressure (Exit) 114/66 102/62      Heart Rate (Admit) 98 bpm 100 bpm      Heart Rate (Exercise) 112 bpm 118 bpm      Heart Rate (Exit) 87 bpm 109 bpm      Oxygen Saturation (Admit) 96 % 97 %      Oxygen Saturation (Exercise) 94 % 94 %      Oxygen Saturation (Exit) 96 % 96 %      Rating of Perceived Exertion (Exercise) 11 12      Perceived Dyspnea (Exercise) 2 3      Symptoms SOB SOB      Comments -- 3rd full day of exercise      Duration -- Progress to 30 minutes of  aerobic without signs/symptoms of physical distress      Intensity -- THRR unchanged        Progression   Progression -- Continue to progress workloads to maintain intensity  without signs/symptoms of physical distress.      Average METs -- 1.94        Resistance Training   Training Prescription -- Yes      Weight -- 3 lb      Reps -- 10-15        Interval Training   Interval Training -- No        Treadmill   MPH -- 1.3      Grade -- 0      Minutes -- 15      METs -- 2        NuStep   Level -- 1      Minutes -- 15      METs -- 2.5        REL-XR   Level -- 1      Minutes -- 15      METs -- 1        Oxygen   Maintain Oxygen Saturation --  88% or higher               Exercise Comments:   Exercise Comments     Row Name 02/15/21 0946           Exercise Comments First full day of exercise!  Patient was oriented to gym and equipment including functions, settings, policies, and procedures.  Patient's individual exercise prescription and treatment plan were reviewed.  All starting workloads were established based on the results of the 6 minute walk test done at initial orientation visit.  The plan for exercise progression was also introduced and progression will be customized based on patient's performance and goals.                Exercise Goals and Review:   Exercise Goals     Row Name 02/10/21 1625             Exercise Goals   Increase Physical Activity Yes       Intervention Provide advice, education, support and counseling about physical activity/exercise needs.;Develop an individualized exercise prescription for aerobic and resistive training based on initial evaluation findings, risk stratification, comorbidities and participant's personal goals.       Expected Outcomes Short Term: Attend rehab on a regular basis to increase amount of physical activity.;Long Term: Add in home exercise to make exercise part of routine and to increase amount of physical activity.;Long Term: Exercising regularly at least 3-5 days a week.       Increase Strength and Stamina Yes       Intervention Provide advice, education, support and  counseling about physical activity/exercise needs.;Develop an individualized exercise prescription for aerobic and resistive training based on initial evaluation findings, risk stratification, comorbidities and participant's personal goals.       Expected Outcomes Short Term: Increase workloads from initial exercise prescription for resistance, speed, and METs.;Short Term: Perform resistance training exercises routinely during rehab and add in resistance training at home;Long Term: Improve cardiorespiratory fitness, muscular endurance and strength as measured by increased METs and functional capacity (6MWT)       Able to understand and use rate of perceived exertion (RPE) scale Yes       Intervention Provide education and explanation on how to use RPE scale       Expected Outcomes Short Term: Able to use RPE daily in rehab to express subjective intensity level;Long Term:  Able to use RPE to guide intensity level when exercising independently       Able to understand and use Dyspnea scale Yes       Intervention Provide education and explanation on how to use Dyspnea scale       Expected Outcomes Short Term: Able to use Dyspnea scale daily in rehab to express subjective sense of shortness of breath during exertion;Long Term: Able to use Dyspnea scale to guide intensity level when exercising independently       Knowledge and understanding of Target Heart Rate Range (THRR) Yes       Intervention Provide education and explanation of THRR including how the numbers were predicted and where they are located for reference       Expected Outcomes Short Term: Able to state/look up THRR;Short Term: Able to use daily as guideline for intensity in rehab;Long Term: Able to use THRR to govern intensity when exercising independently       Able to check pulse independently Yes       Intervention Provide education and demonstration on how  to check pulse in carotid and radial arteries.;Review the importance of being able to  check your own pulse for safety during independent exercise       Expected Outcomes Short Term: Able to explain why pulse checking is important during independent exercise;Long Term: Able to check pulse independently and accurately       Understanding of Exercise Prescription Yes       Intervention Provide education, explanation, and written materials on patient's individual exercise prescription       Expected Outcomes Short Term: Able to explain program exercise prescription;Long Term: Able to explain home exercise prescription to exercise independently                Exercise Goals Re-Evaluation :  Exercise Goals Re-Evaluation     Row Name 02/15/21 0946 02/28/21 1001           Exercise Goal Re-Evaluation   Exercise Goals Review Increase Physical Activity;Able to understand and use rate of perceived exertion (RPE) scale;Knowledge and understanding of Target Heart Rate Range (THRR);Understanding of Exercise Prescription;Increase Strength and Stamina;Able to understand and use Dyspnea scale;Able to check pulse independently Increase Physical Activity;Increase Strength and Stamina      Comments Reviewed RPE and dyspnea scales, THR and program prescription with pt today.  Pt voiced understanding and was given a copy of goals to take home. Brandy Golden is doing well the first couple of sessions she has been in rehab. She is currently tolerating her initial exercise prescription well and will continue to monitor for progression as she progresses through the program.      Expected Outcomes Short: Use RPE daily to regulate intensity. Long: Follow program prescription in THR. Short: Continue current exercise prescription Long: Increase overall MET level               Discharge Exercise Prescription (Final Exercise Prescription Changes):  Exercise Prescription Changes - 02/28/21 1000       Response to Exercise   Blood Pressure (Admit) 112/62    Blood Pressure (Exercise) 134/68    Blood  Pressure (Exit) 102/62    Heart Rate (Admit) 100 bpm    Heart Rate (Exercise) 118 bpm    Heart Rate (Exit) 109 bpm    Oxygen Saturation (Admit) 97 %    Oxygen Saturation (Exercise) 94 %    Oxygen Saturation (Exit) 96 %    Rating of Perceived Exertion (Exercise) 12    Perceived Dyspnea (Exercise) 3    Symptoms SOB    Comments 3rd full day of exercise    Duration Progress to 30 minutes of  aerobic without signs/symptoms of physical distress    Intensity THRR unchanged      Progression   Progression Continue to progress workloads to maintain intensity without signs/symptoms of physical distress.    Average METs 1.94      Resistance Training   Training Prescription Yes    Weight 3 lb    Reps 10-15      Interval Training   Interval Training No      Treadmill   MPH 1.3    Grade 0    Minutes 15    METs 2      NuStep   Level 1    Minutes 15    METs 2.5      REL-XR   Level 1    Minutes 15    METs 1      Oxygen   Maintain Oxygen Saturation 88% or higher  Nutrition:  Target Goals: Understanding of nutrition guidelines, daily intake of sodium <1571m, cholesterol <2043m calories 30% from fat and 7% or less from saturated fats, daily to have 5 or more servings of fruits and vegetables.  Education: All About Nutrition: -Group instruction provided by verbal, written material, interactive activities, discussions, models, and posters to present general guidelines for heart healthy nutrition including fat, fiber, MyPlate, the role of sodium in heart healthy nutrition, utilization of the nutrition label, and utilization of this knowledge for meal planning. Follow up email sent as well. Written material given at graduation. [facility]  Date 02/17/21  [facility] Code 1- Verbalizes Understanding       Biometrics:  Pre Biometrics - 02/10/21 1625       Pre Biometrics    Height 5' 10.5" (1.791 m)    Weight 200 lb 12.8 oz (91.1 kg)    BMI (Calculated) 28.39    Single Leg Stand 3.18 seconds              Nutrition Therapy Plan and Nutrition Goals:  Nutrition Therapy & Goals - 02/23/21 1003       Nutrition Therapy   Diet Heart healthy, low Na    Protein (specify units) 70g    Fiber 25 grams    Whole Grain Foods 3 servings    Saturated Fats 12 max. grams    Fruits and Vegetables 8 servings/day    Sodium 1.5 grams      Personal Nutrition Goals   Nutrition Goal ST: make sure getting whole grains in breakfast (whole grain tortilla, whole grain cereal), when making meals at home continue to focus on whole grains, healthy oils/fat, low salt, and variety LT: make sure meeting protien and calories needs during the day, make sure at least half of grains consumed are whole grains    Comments She is trying to cut out sugar and salt and she is eating many vegetables. She has reflux and she eats before 6pm. She has a lower appetite. B: cereal with nuts and a caramel cappichino or yogurt with nuts or banana with tortilla and peanut butter or oatmeal or grits or biscuit from [employer] S: fruit with cheese L/D 2-3pm: big meals. Goes out to eat or eats at home and leftovers the next day. Sandwiches, soups, pasta, salad (she tries to eat as much salad as possible as long as it works for her IBS - she is on a new medicaiton for that which is helping)  S: fruit  Drinks:  She went to nutrition education last week and has no questions regarding heart healthy eating. Discussed how eating and cooking at home gives more control over salt, types of fat like healthy oils, variety, and less whole grains. She does not want to be on any kind of diet, but is willing to make small modifications. She does not want to limit going out to eat.      Intervention Plan   Intervention Prescribe, educate and counsel regarding individualized specific dietary modifications aiming towards targeted  core components such as weight, hypertension, lipid management, diabetes, heart failure and other comorbidities.;Nutrition handout(s) given to patient.    Expected Outcomes Short Term Goal: Understand basic principles of dietary content, such as calories, fat, sodium, cholesterol and nutrients.;Short Term Goal: A plan has been developed with personal nutrition goals set during dietitian appointment.;Long Term Goal: Adherence to prescribed nutrition plan.  Nutrition Assessments:  MEDIFICTS Score Key: ?70 Need to make dietary changes  40-70 Heart Healthy Diet ? 40 Therapeutic Level Cholesterol Diet  Flowsheet Row Pulmonary Rehab from 02/10/2021 in [facility] Cardiac and Pulmonary Rehab  Picture Your Plate Total Score on Admission 61      Picture Your Plate Scores: <11 Unhealthy dietary pattern with much room for improvement. 41-50 Dietary pattern unlikely to meet recommendations for good health and room for improvement. 51-60 More healthful dietary pattern, with some room for improvement.  >60 Healthy dietary pattern, although there may be some specific behaviors that could be improved.   Nutrition Goals Re-Evaluation:   Nutrition Goals Discharge (Final Nutrition Goals Re-Evaluation):   Psychosocial: Target Goals: Acknowledge presence or absence of significant depression and/or stress, maximize coping skills, provide positive support system. Participant is able to verbalize types and ability to use techniques and skills needed for reducing stress and depression.   Education: Stress, Anxiety, and Depression - Group verbal and visual presentation to define topics covered.  Reviews how body is impacted by stress, anxiety, and depression.  Also discusses healthy ways to reduce stress and to treat/manage anxiety and depression.  Written material given at graduation.   Education: Sleep Hygiene -Provides group verbal and written instruction about how sleep can affect your health.   Define sleep hygiene, discuss sleep cycles and impact of sleep habits. Review good sleep hygiene tips.    Initial Review & Psychosocial Screening:  Initial Psych Review & Screening - 01/25/21 0949       Initial Review   Current issues with Current Psychotropic Meds;Current Stress Concerns    Source of Stress Concerns Unable to perform yard/household activities    Comments Stress unable to do ususal chores.  Takes more energy for ADLs      Family Dynamics   Good Support System? Yes   Family, friends church family   Comments Husband is up and doing, not back to normal.  Had multiple surgeries, infection in foot.      Barriers   Psychosocial barriers to participate in program There are no identifiable barriers or psychosocial needs.      Screening Interventions   Interventions Encouraged to exercise;To provide support and resources with identified psychosocial needs;Provide feedback about the scores to participant    Expected Outcomes Short Term goal: Utilizing psychosocial counselor, staff and physician to assist with identification of specific Stressors or current issues interfering with healing process. Setting desired goal for each stressor or current issue identified.;Long Term Goal: Stressors or current issues are controlled or eliminated.;Short Term goal: Identification and review with participant of any Quality of Life or Depression concerns found by scoring the questionnaire.;Long Term goal: The participant improves quality of Life and PHQ9 Scores as seen by post scores and/or verbalization of changes             Quality of Life Scores:  Scores of 19 and below usually indicate a poorer quality of life in these areas.  A difference of  2-3 points is a clinically meaningful difference.  A difference of 2-3 points in the total score of the Quality of Life Index has been associated with significant improvement in overall quality of life, self-image, physical symptoms, and general  health in studies assessing change in quality of life.  PHQ-9: Recent Review Flowsheet Data     Depression screen [facility] 2/9 02/10/2021   Decreased Interest 1   Down, Depressed, Hopeless 0   PHQ - 2 Score 1  Altered sleeping 3    Tired, decreased energy 3   Change in appetite 2    Feeling bad or failure about yourself  3   Trouble concentrating 0   Moving slowly or fidgety/restless 2   Suicidal thoughts 0   PHQ-9 Score 14   Difficult doing work/chores Somewhat difficult      Interpretation of Total Score  Total Score Depression Severity:  1-4 = Minimal depression, 5-9 = Mild depression, 10-14 = Moderate depression, 15-19 = Moderately severe depression, 20-27 = Severe depression   Psychosocial Evaluation and Intervention:  Psychosocial Evaluation - 01/25/21 1008       Psychosocial Evaluation & Interventions   Interventions Encouraged to exercise with the program and follow exercise prescription    Comments Brandy Golden had delayed starting the program secondary to her husband requiring surgery and a recovery from infection. She is ready now and looking forward to joining the program. She lives with her husband and has family nearby. She wants to get back to her ability to exercise and is hopeful this will improve her ability to get back to some of her activities that have been hard to accomplish.  She had COVID early 2020.  She feels some of her SOB may be from that infection.  She should do well,as she is ready to get started.    Expected Outcomes STG: Brandy Golden is able to attend all scheduled sessions, progress with her exercise. LTG Brandy Golden continues her exercise progression after discharge and has ability to complete some of the activies that she could not complete before attending the program    Continue Psychosocial Services  Follow up required by staff             Psychosocial Re-Evaluation:   Psychosocial Discharge (Final Psychosocial Re-Evaluation):   Education: Education  Goals: Education classes will be provided on a weekly basis, covering required topics. Participant will state understanding/return demonstration of topics presented.  Learning Barriers/Preferences:  Learning Barriers/Preferences - 01/25/21 0958       Learning Barriers/Preferences   Learning Barriers None    Learning Preferences None             General Pulmonary Education Topics:  Infection Prevention: - Provides verbal and written material to individual with discussion of infection control including proper hand washing and proper equipment cleaning during exercise session. Flowsheet Row Pulmonary Rehab from 02/17/2021 in [facility] Cardiac and Pulmonary Rehab  Date 02/10/21  Educator AS  Instruction Review Code 1- Verbalizes Understanding       Falls Prevention: - Provides verbal and written material to individual with discussion of falls prevention and safety. Flowsheet Row Pulmonary Rehab from 02/17/2021 in [facility] Cardiac and Pulmonary Rehab  Date 02/10/21  Educator AS  Instruction Review Code 1- Verbalizes Understanding       Chronic Lung Disease Review: - Group verbal instruction with posters, models, PowerPoint presentations and videos,  to review new updates, new respiratory medications, new advancements in procedures and treatments. Providing information on websites and "800" numbers for continued self-education. Includes information about supplement oxygen, available portable oxygen systems, continuous and intermittent flow rates, oxygen safety, concentrators, and Medicare reimbursement for oxygen. Explanation of Pulmonary Drugs, including class, frequency, complications, importance of spacers, rinsing mouth after steroid MDI's, and proper cleaning methods for nebulizers. Review of basic lung anatomy and physiology related to function, structure, and complications of lung disease. Review of risk factors. Discussion about methods for diagnosing sleep apnea and types of masks  and machines for  OSA. Includes a review of the use of types of environmental controls: home humidity, furnaces, filters, dust mite/pet prevention, HEPA vacuums. Discussion about weather changes, air quality and the benefits of nasal washing. Instruction on Warning signs, infection symptoms, calling MD promptly, preventive modes, and value of vaccinations. Review of effective airway clearance, coughing and/or vibration techniques. Emphasizing that all should Create an Action Plan. Written material given at graduation. Flowsheet Row Pulmonary Rehab from 02/17/2021 in [facility] Cardiac and Pulmonary Rehab  Education need identified 02/10/21       AED/CPR: - Group verbal and written instruction with the use of models to demonstrate the basic use of the AED with the basic ABC's of resuscitation.    Anatomy and Cardiac Procedures: - Group verbal and visual presentation and models provide information about basic cardiac anatomy and function. Reviews the testing methods done to diagnose heart disease and the outcomes of the test results. Describes the treatment choices: Medical Management, Angioplasty, or Coronary Bypass Surgery for treating various heart conditions including Myocardial Infarction, Angina, Valve Disease, and Cardiac Arrhythmias.  Written material given at graduation.   Medication Safety: - Group verbal and visual instruction to review commonly prescribed medications for heart and lung disease. Reviews the medication, class of the drug, and side effects. Includes the steps to properly store meds and maintain the prescription regimen.  Written material given at graduation.   Other: -Provides group and verbal instruction on various topics (see comments)   Knowledge Questionnaire Score:  Knowledge Questionnaire Score - 02/10/21 1628       Knowledge Questionnaire Score   Pre Score 15/18              Core Components/Risk Factors/Patient Goals at Admission:  Personal Goals and Risk  Factors at Admission - 02/10/21 1630       Core Components/Risk Factors/Patient Goals on Admission    Weight Management Yes;Weight Loss    Intervention Weight Management: Develop a combined nutrition and exercise program designed to reach desired caloric intake, while maintaining appropriate intake of nutrient and fiber, sodium and fats, and appropriate energy expenditure required for the weight goal.;Weight Management: Provide education and appropriate resources to help participant work on and attain dietary goals.    Admit Weight 200 lb 12.8 oz (91.1 kg)    Goal Weight: Short Term 195 lb (88.5 kg)    Goal Weight: Long Term 190 lb (86.2 kg)    Expected Outcomes Short Term: Continue to assess and modify interventions until short term weight is achieved;Long Term: Adherence to nutrition and physical activity/exercise program aimed toward attainment of established weight goal;Weight Loss: Understanding of general recommendations for a balanced deficit meal plan, which promotes 1-2 lb weight loss per week and includes a negative energy balance of [phone] kcal/d    Improve shortness of breath with ADL's Yes    Intervention Provide education, individualized exercise plan and daily activity instruction to help decrease symptoms of SOB with activities of daily living.    Expected Outcomes Short Term: Improve cardiorespiratory fitness to achieve a reduction of symptoms when performing ADLs;Long Term: Be able to perform more ADLs without symptoms or delay the onset of symptoms    Increase knowledge of respiratory medications and ability to use respiratory devices properly  Yes    Intervention Provide education and demonstration as needed of appropriate use of medications, inhalers, and oxygen therapy.    Expected Outcomes Short Term: Achieves understanding of medications use. Understands that oxygen is a medication prescribed by physician.  Demonstrates appropriate use of inhaler and oxygen therapy.;Long Term:  Maintain appropriate use of medications, inhalers, and oxygen therapy.    Hypertension Yes    Intervention Provide education on lifestyle modifcations including regular physical activity/exercise, weight management, moderate sodium restriction and increased consumption of fresh fruit, vegetables, and low fat dairy, alcohol moderation, and smoking cessation.;Monitor prescription use compliance.    Expected Outcomes Short Term: Continued assessment and intervention until BP is < 140/43m HG in hypertensive participants. < 130/851mHG in hypertensive participants with diabetes, heart failure or chronic kidney disease.;Long Term: Maintenance of blood pressure at goal levels.             Education:Diabetes - Individual verbal and written instruction to review signs/symptoms of diabetes, desired ranges of glucose level fasting, after meals and with exercise. Acknowledge that pre and post exercise glucose checks will be done for 3 sessions at entry of program.   Know Your Numbers and Heart Failure: - Group verbal and visual instruction to discuss disease risk factors for cardiac and pulmonary disease and treatment options.  Reviews associated critical values for Overweight/Obesity, Hypertension, Cholesterol, and Diabetes.  Discusses basics of heart failure: signs/symptoms and treatments.  Introduces Heart Failure Zone chart for action plan for heart failure.  Written material given at graduation.   Core Components/Risk Factors/Patient Goals Review:    Core Components/Risk Factors/Patient Goals at Discharge (Final Review):    ITP Comments:  ITP Comments     Row Name 01/25/21 1017 02/10/21 1639 02/15/21 0944 02/23/21 1025 03/02/21 0620   ITP Comments Virtual orientation call completed today. shehas an appointment on Date: 02/10/2021  for EP eval and gym Orientation.  [facility] . Completed 6MWT and gym orientation. Initial ITP created and sent  for review to Dr.Fuad AlLanney GinsD, Medical Director. First full day of exercise!  Patient was oriented to gym and equipment including functions, settings, policies, and procedures.  Patient's individual exercise prescription and treatment plan were reviewed.  All starting workloads were established based on the results of the 6 minute walk test done at initial orientation visit.  The plan for exercise progression was also introduced and progression will be customized based on patient's performance and goals. Completed initial RD consultation 30 Day review completed. Medical Director ITP review done, changes made as directed, and signed approval by Medical Director.            Comments:

## 2021-03-03 ENCOUNTER — Other Ambulatory Visit: Payer: Self-pay

## 2021-03-03 ENCOUNTER — Encounter: Payer: Medicare PPO | Attending: Pulmonary Disease

## 2021-03-03 DIAGNOSIS — R0609 Other forms of dyspnea: Secondary | ICD-10-CM | POA: Insufficient documentation

## 2021-03-03 NOTE — Progress Notes (Signed)
Daily Session Note  Patient Details  Name: Brandy Golden MRN: [id number] Date of Birth: [birth year] Referring Provider:   April Manson Pulmonary Rehab from 02/10/2021 in [facility] Cardiac and Pulmonary Rehab  Referring Provider Lanney Gins       Encounter Date: 03/03/2021  Check In:  Session Check In - 03/03/21 0957       Check-In   Supervising physician immediately available to respond to emergencies See telemetry face sheet for immediately available ER MD    Location [facility]-Cardiac & Pulmonary Rehab    Staff Present Birdie Sons, MPA, Elveria Rising, BA, ACSM CEP, Exercise Physiologist;Satori Krabill Amedeo Plenty, BS, ACSM CEP, Exercise Physiologist;Melissa Caiola, RDN, LDN    Virtual Visit No    Medication changes reported     No    Fall or balance concerns reported    No    Warm-up and Cool-down Performed on first and last piece of equipment    Resistance Training Performed Yes    VAD Patient? No    PAD/SET Patient? No      Pain Assessment   Currently in Pain? No/denies                Social History   Tobacco Use  Smoking Status Never  Smokeless Tobacco Never    Goals Met:  Independence with exercise equipment Exercise tolerated well No report of concerns or symptoms today Strength training completed today  Goals Unmet:  Not Applicable  Comments: Pt able to follow exercise prescription today without complaint.  Will continue to monitor for progression.    Dr. Emily Filbert is Medical Director for [place].  Dr. Ottie Glazier is Medical Director for [facility] Pulmonary Rehabilitation.

## 2021-03-08 ENCOUNTER — Other Ambulatory Visit: Payer: Self-pay

## 2021-03-08 ENCOUNTER — Encounter: Payer: Medicare PPO | Admitting: *Deleted

## 2021-03-08 DIAGNOSIS — R0609 Other forms of dyspnea: Secondary | ICD-10-CM | POA: Diagnosis not present

## 2021-03-08 NOTE — Progress Notes (Signed)
Daily Session Note  Patient Details  Name: Brandy Golden MRN: [id number] Date of Birth: [birth year] Referring Provider:   April Manson Pulmonary Rehab from 02/10/2021 in [facility] Cardiac and Pulmonary Rehab  Referring Provider Lanney Gins       Encounter Date: 03/08/2021  Check In:  Session Check In - 03/08/21 1153       Check-In   Supervising physician immediately available to respond to emergencies See telemetry face sheet for immediately available ER MD    Location [facility]-Cardiac & Pulmonary Rehab    Staff Present Nyoka Cowden, RN, BSN, Ardeth Sportsman, RDN, Rowe Pavy, BA, ACSM CEP, Exercise Physiologist    Virtual Visit No    Medication changes reported     No    Fall or balance concerns reported    No    Tobacco Cessation No Change    Warm-up and Cool-down Performed on first and last piece of equipment    Resistance Training Performed Yes    VAD Patient? No    PAD/SET Patient? No      Pain Assessment   Currently in Pain? No/denies                Social History   Tobacco Use  Smoking Status Never  Smokeless Tobacco Never    Goals Met:  Independence with exercise equipment Exercise tolerated well No report of concerns or symptoms today  Goals Unmet:  Not Applicable  Comments: Pt able to follow exercise prescription today without complaint.  Will continue to monitor for progression.    Dr. Emily Filbert is Medical Director for [place].  Dr. Ottie Glazier is Medical Director for [facility] Pulmonary Rehabilitation.

## 2021-03-09 ENCOUNTER — Telehealth: Payer: Self-pay | Admitting: *Deleted

## 2021-03-09 NOTE — Telephone Encounter (Signed)
Brandy Golden called staff stating she fell today and injured her knee. She is unable to walk on it and is planning on resting it tonight and call her doctor tomorrow if it does not improve. She will not be coming to her scheduled appointment tomorrow and will check in with staff next week.

## 2021-03-21 DIAGNOSIS — M47896 Other spondylosis, lumbar region: Secondary | ICD-10-CM | POA: Diagnosis not present

## 2021-03-21 DIAGNOSIS — S8000XA Contusion of unspecified knee, initial encounter: Secondary | ICD-10-CM | POA: Diagnosis not present

## 2021-03-24 DIAGNOSIS — L570 Actinic keratosis: Secondary | ICD-10-CM | POA: Diagnosis not present

## 2021-03-24 DIAGNOSIS — D2261 Melanocytic nevi of right upper limb, including shoulder: Secondary | ICD-10-CM | POA: Diagnosis not present

## 2021-03-24 DIAGNOSIS — D485 Neoplasm of uncertain behavior of skin: Secondary | ICD-10-CM | POA: Diagnosis not present

## 2021-03-24 DIAGNOSIS — Z85828 Personal history of other malignant neoplasm of skin: Secondary | ICD-10-CM | POA: Diagnosis not present

## 2021-03-24 DIAGNOSIS — C44319 Basal cell carcinoma of skin of other parts of face: Secondary | ICD-10-CM | POA: Diagnosis not present

## 2021-03-24 DIAGNOSIS — X32XXXA Exposure to sunlight, initial encounter: Secondary | ICD-10-CM | POA: Diagnosis not present

## 2021-03-24 DIAGNOSIS — C44519 Basal cell carcinoma of skin of other part of trunk: Secondary | ICD-10-CM | POA: Diagnosis not present

## 2021-03-24 DIAGNOSIS — D2262 Melanocytic nevi of left upper limb, including shoulder: Secondary | ICD-10-CM | POA: Diagnosis not present

## 2021-03-24 DIAGNOSIS — D225 Melanocytic nevi of trunk: Secondary | ICD-10-CM | POA: Diagnosis not present

## 2021-03-29 ENCOUNTER — Telehealth: Payer: Self-pay

## 2021-03-29 DIAGNOSIS — R0609 Other forms of dyspnea: Secondary | ICD-10-CM

## 2021-03-29 NOTE — Telephone Encounter (Signed)
Sandy fell and is having knee issues.  She is walking with a walker - she broke her tail bone so she cant do seated machines. She sees her PCP and orthopedic Dr next week.  She will let us know next week if she can continue after she sees them.

## 2021-03-29 NOTE — Progress Notes (Signed)
Brandy Golden fell and is having knee issues.  She is walking with a walker - she broke her tail bone so she cant do seated machines. She sees her PCP and orthopedic Dr next week.  She will let us know next week if she can continue after she sees them.

## 2021-03-30 ENCOUNTER — Encounter: Payer: Self-pay | Admitting: *Deleted

## 2021-03-30 DIAGNOSIS — R0609 Other forms of dyspnea: Secondary | ICD-10-CM

## 2021-03-30 NOTE — Progress Notes (Signed)
Pulmonary Individual Treatment Plan  Patient Details  Name: Brandy Golden MRN: [id number] Date of Birth: 1946/03/04 Referring Provider:   Flowsheet Row Pulmonary Rehab from 02/10/2021 in [facility] Cardiac and Pulmonary Rehab  Referring Provider Aleskerov       Initial Encounter Date:  Flowsheet Row Pulmonary Rehab from 02/10/2021 in [facility] Cardiac and Pulmonary Rehab  Date 02/10/21       Visit Diagnosis: DOE (dyspnea on exertion)  Patient's Home Medications on Admission:  Current Outpatient Medications:    ALPRAZolam (XANAX) 0.5 MG tablet, Take 0.5 mg by mouth 2 (two) times daily as needed for anxiety or sleep., Disp: , Rfl:    Biotin 5 MG CAPS, Take 5 mg by mouth 2 (two) times daily., Disp: , Rfl:    Ca Carbonate-Mag Hydroxide (ROLAIDS PO), Take 2 each by mouth daily as needed (for acid reflux)., Disp: , Rfl:    Calcium Carb-Cholecalciferol (CALCIUM + VITAMIN D3 PO), Take 2 tablets by mouth 2 (two) times daily., Disp: , Rfl:    Cholecalciferol (VITAMIN D3) 1000 units CAPS, Take 1,000 Units by mouth daily., Disp: , Rfl:    colestipol (COLESTID) 1 g tablet, Take 2 tablets (2 g total) by mouth 2 (two) times daily., Disp: 180 tablet, Rfl: 3   ELIQUIS 5 MG TABS tablet, Take 5 mg by mouth 2 (two) times daily., Disp: , Rfl:    fluticasone (FLONASE) 50 MCG/ACT nasal spray, TAKE 2 SPRAYS INTO EACH NOSTRIL AT BEDTIME, Disp: , Rfl:    lisinopril (PRINIVIL,ZESTRIL) 10 MG tablet, Take 10 mg by mouth daily., Disp: , Rfl:    Multiple Vitamin (MULTI-VITAMIN DAILY PO), Take 1 tablet by mouth daily., Disp: , Rfl:    omeprazole (PRILOSEC) 40 MG capsule, Take 1 capsule (40 mg total) by mouth 2 (two) times daily before a meal., Disp: 180 capsule, Rfl: 3   sertraline (ZOLOFT) 100 MG tablet, Take 100 mg by mouth daily., Disp: , Rfl:    sodium fluoride (FLUORISHIELD) 1.1 % GEL dental gel, Place onto teeth., Disp: , Rfl:    SYNTHROID 125 MCG tablet, Take 125 mcg by mouth daily before breakfast. , Disp: , Rfl:     vitamin C (ASCORBIC ACID) 500 MG tablet, Take 1,000 mg by mouth daily. , Disp: , Rfl:    zinc gluconate 50 MG tablet, Take 50 mg by mouth daily., Disp: , Rfl:   Past Medical History: Past Medical History:  Diagnosis Date   Allergy    Anemia, iron deficiency    Anxiety    Atrial fibrillation ([place]) 01/2021   Benign essential tremor    Blood transfusion without reported diagnosis    Cancer ([place])    bacal cell nd squmous cell skin cancers   Cataract    Chronic kidney disease    Colon polyps    Depression    Diverticulosis    Dysphonia    Dysrhythmia    Fibrocystic Breast disease    pt denies   Gastric ulcer 2006   Gastrointestinal hemorrhage, hx of    GERD (gastroesophageal reflux disease)    History of COVID-19    History of hiatal hernia    Hx of basal cell carcinoma    and squamous cell skin cancer   Hyperlipidemia    Hypertension    Hypothyroidism    IBS (irritable bowel syndrome)    Internal hemorrhoids without mention of complication    LBBB (left bundle branch block)    Osteoarthritis    Overactive bladder  Stroke [facility]) 03/2018   Unspecified hypothyroidism    Vitamin D deficiency     Tobacco Use: Social History   Tobacco Use  Smoking Status Never  Smokeless Tobacco Never    Labs: Recent Review Flowsheet Data   There is no flowsheet data to display.      Pulmonary Assessment Scores:  Pulmonary Assessment Scores     Row Name 02/10/21 1628         ADL UCSD   SOB Score total 74     Rest 0     Walk 3     Stairs 5     Bath 3     Dress 3     Shop 3       CAT Score   CAT Score 21       mMRC Score   mMRC Score 1              UCSD: Self-administered rating of dyspnea associated with activities of daily living (ADLs) 6-point scale (0 = "not at all" to 5 = "maximal or unable to do because of breathlessness")  Scoring Scores range from 0 to 120.  Minimally important difference is 5 units  CAT: CAT can identify the health  impairment of COPD patients and is better correlated with disease progression.  CAT has a scoring range of zero to 40. The CAT score is classified into four groups of low (less than 10), medium (10 - 20), high (21-30) and very high (31-40) based on the impact level of disease on health status. A CAT score over 10 suggests significant symptoms.  A worsening CAT score could be explained by an exacerbation, poor medication adherence, poor inhaler technique, or progression of COPD or comorbid conditions.  CAT MCID is 2 points  mMRC: mMRC (Modified Medical Research Council) Dyspnea Scale is used to assess the degree of baseline functional disability in patients of respiratory disease due to dyspnea. No minimal important difference is established. A decrease in score of 1 point or greater is considered a positive change.   Pulmonary Function Assessment:   Exercise Target Goals: Exercise Program Goal: Individual exercise prescription set using results from initial 6 min walk test and THRR while considering  patients activity barriers and safety.   Exercise Prescription Goal: Initial exercise prescription builds to 30-45 minutes a day of aerobic activity, 2-3 days per week.  Home exercise guidelines will be given to patient during program as part of exercise prescription that the participant will acknowledge.  Education: Aerobic Exercise: - Group verbal and visual presentation on the components of exercise prescription. Introduces F.I.T.T principle from ACSM for exercise prescriptions.  Reviews F.I.T.T. principles of aerobic exercise including progression. Written material given at graduation.   Education: Resistance Exercise: - Group verbal and visual presentation on the components of exercise prescription. Introduces F.I.T.T principle from ACSM for exercise prescriptions  Reviews F.I.T.T. principles of resistance exercise including progression. Written material given at graduation.    Education:  Exercise & Equipment Safety: - Individual verbal instruction and demonstration of equipment use and safety with use of the equipment. Flowsheet Row Pulmonary Rehab from 03/03/2021 in [facility] Cardiac and Pulmonary Rehab  Date 02/10/21  Educator AS  Instruction Review Code 1- Verbalizes Understanding       Education: Exercise Physiology & General Exercise Guidelines: - Group verbal and written instruction with models to review the exercise physiology of the cardiovascular system and associated critical values. Provides general exercise guidelines with specific guidelines to those  with heart or lung disease.    Education: Flexibility, Balance, Mind/Body Relaxation: - Group verbal and visual presentation with interactive activity on the components of exercise prescription. Introduces F.I.T.T principle from ACSM for exercise prescriptions. Reviews F.I.T.T. principles of flexibility and balance exercise training including progression. Also discusses the mind body connection.  Reviews various relaxation techniques to help reduce and manage stress (i.e. Deep breathing, progressive muscle relaxation, and visualization). Balance handout provided to take home. Written material given at graduation.   Activity Barriers & Risk Stratification:  Activity Barriers & Cardiac Risk Stratification - 01/25/21 0941       Activity Barriers & Cardiac Risk Stratification   Activity Barriers Muscular Weakness;Shortness of Breath             6 Minute Walk:  6 Minute Walk     Row Name 02/10/21 1616         6 Minute Walk   Phase Initial     Distance 862 feet     Walk Time 6 minutes     # of Rest Breaks 0     MPH 1.63     METS 1.9     RPE 11     Perceived Dyspnea  2     VO2 Peak 6.6     Symptoms Yes (comment)     Comments SOB     Resting HR 98 bpm     Resting BP 122/70     Resting Oxygen Saturation  96 %     Exercise Oxygen Saturation  during 6 min walk 94 %     Max Ex. HR 112 bpm     Max Ex. BP  134/74     2 Minute Post BP 114/66       Interval HR   1 Minute HR 96     2 Minute HR 107     3 Minute HR 109     4 Minute HR 107     5 Minute HR 109     6 Minute HR 112     2 Minute Post HR 87     Interval Heart Rate? Yes       Interval Oxygen   Interval Oxygen? Yes     Baseline Oxygen Saturation % 96 %     1 Minute Oxygen Saturation % 94 %     1 Minute Liters of Oxygen 0 L     2 Minute Oxygen Saturation % 94 %     2 Minute Liters of Oxygen 0 L     3 Minute Oxygen Saturation % 95 %     3 Minute Liters of Oxygen 0 L     4 Minute Oxygen Saturation % 95 %     4 Minute Liters of Oxygen 0 L     5 Minute Oxygen Saturation % 94 %     5 Minute Liters of Oxygen 0 L     6 Minute Oxygen Saturation % 94 %     6 Minute Liters of Oxygen 0 L     2 Minute Post Oxygen Saturation % 96 %     2 Minute Post Liters of Oxygen 0 L             Oxygen Initial Assessment:  Oxygen Initial Assessment - 02/10/21 1711       Home Oxygen   Home Oxygen Device None    Sleep Oxygen Prescription None    Home Exercise Oxygen Prescription None  Home Resting Oxygen Prescription None    Compliance with Home Oxygen Use Yes      Initial 6 min Walk   Oxygen Used None      Program Oxygen Prescription   Program Oxygen Prescription None      Intervention   Short Term Goals To learn and demonstrate proper use of respiratory medications;To learn and demonstrate proper pursed lip breathing techniques or other breathing techniques. ;To learn and understand importance of monitoring SPO2 with pulse oximeter and demonstrate accurate use of the pulse oximeter.    Long  Term Goals Compliance with respiratory medication;Exhibits proper breathing techniques, such as pursed lip breathing or other method taught during program session;Demonstrates proper use of MDIs             Oxygen Re-Evaluation:  Oxygen Re-Evaluation     Row Name 02/15/21 0948             Program Oxygen Prescription   Program  Oxygen Prescription None         Home Oxygen   Home Oxygen Device None       Sleep Oxygen Prescription None       Home Exercise Oxygen Prescription None       Home Resting Oxygen Prescription None       Compliance with Home Oxygen Use Yes         Goals/Expected Outcomes   Short Term Goals To learn and demonstrate proper pursed lip breathing techniques or other breathing techniques. ;To learn and understand importance of monitoring SPO2 with pulse oximeter and demonstrate accurate use of the pulse oximeter.       Long  Term Goals Compliance with respiratory medication;Exhibits proper breathing techniques, such as pursed lip breathing or other method taught during program session       Comments Reviewed PLB technique with pt.  Talked about how it works and it's importance in maintaining their exercise saturations.       Goals/Expected Outcomes Short: Become more profiecient at using PLB.   Long: Become independent at using PLB.                Oxygen Discharge (Final Oxygen Re-Evaluation):  Oxygen Re-Evaluation - 02/15/21 0948       Program Oxygen Prescription   Program Oxygen Prescription None      Home Oxygen   Home Oxygen Device None    Sleep Oxygen Prescription None    Home Exercise Oxygen Prescription None    Home Resting Oxygen Prescription None    Compliance with Home Oxygen Use Yes      Goals/Expected Outcomes   Short Term Goals To learn and demonstrate proper pursed lip breathing techniques or other breathing techniques. ;To learn and understand importance of monitoring SPO2 with pulse oximeter and demonstrate accurate use of the pulse oximeter.    Long  Term Goals Compliance with respiratory medication;Exhibits proper breathing techniques, such as pursed lip breathing or other method taught during program session    Comments Reviewed PLB technique with pt.  Talked about how it works and it's importance in maintaining their exercise saturations.    Goals/Expected Outcomes  Short: Become more profiecient at using PLB.   Long: Become independent at using PLB.             Initial Exercise Prescription:  Initial Exercise Prescription - 02/10/21 1600       Date of Initial Exercise RX and Referring Provider   Date 02/10/21    Referring Provider  Aleskerov      Oxygen   Maintain Oxygen Saturation 88% or higher      Treadmill   MPH 1.3    Grade 0    Minutes 15    METs 2      NuStep   Level 1    SPM 80    Minutes 15    METs 1.9      REL-XR   Level 1    Speed 50    Minutes 15    METs 1.9      Track   Laps 16    Minutes 15    METs 1.9      Prescription Details   Frequency (times per week) 3    Duration Progress to 30 minutes of continuous aerobic without signs/symptoms of physical distress      Intensity   THRR 40-80% of Max Heartrate 116-134    Ratings of Perceived Exertion 11-15    Perceived Dyspnea 0-4      Resistance Training   Training Prescription Yes    Weight 3 lb    Reps 10-15             Perform Capillary Blood Glucose checks as needed.  Exercise Prescription Changes:   Exercise Prescription Changes     Row Name 02/10/21 1600 02/28/21 1000 03/15/21 0800         Response to Exercise   Blood Pressure (Admit) 122/70 112/62 126/64     Blood Pressure (Exercise) 134/74 134/68 124/70     Blood Pressure (Exit) 114/66 102/62 118/68     Heart Rate (Admit) 98 bpm 100 bpm 87 bpm     Heart Rate (Exercise) 112 bpm 118 bpm 101 bpm     Heart Rate (Exit) 87 bpm 109 bpm --     Oxygen Saturation (Admit) 96 % 97 % 96 %     Oxygen Saturation (Exercise) 94 % 94 % --     Oxygen Saturation (Exit) 96 % 96 % --     Rating of Perceived Exertion (Exercise) _0 Perceived Dyspnea (Exercise) 2 3 --     Symptoms SOB SOB --     Comments -- 3rd full day of exercise --     Duration -- Progress to 30 minutes of  aerobic without signs/symptoms of physical distress Progress to 30 minutes of  aerobic without signs/symptoms of  physical distress     Intensity -- THRR unchanged THRR unchanged       Progression   Progression -- Continue to progress workloads to maintain intensity without signs/symptoms of physical distress. Continue to progress workloads to maintain intensity without signs/symptoms of physical distress.     Average METs -- 1.94 2.2       Resistance Training   Training Prescription -- Yes Yes     Weight -- 3 lb 3 lb     Reps -- 10-15 10-15       Interval Training   Interval Training -- No No       Treadmill   MPH -- 1.3 1.5     Grade -- 0 --     Minutes -- 15 15     METs -- 2 2.15       NuStep   Level -- 1 1     Minutes -- 15 15     METs -- 2.5 2.2       REL-XR   Level -- 1 --  Minutes -- 15 --     METs -- 1 --       Oxygen   Maintain Oxygen Saturation -- 88% or higher 88% or higher              Exercise Comments:   Exercise Comments     Row Name 02/15/21 0946           Exercise Comments First full day of exercise!  Patient was oriented to gym and equipment including functions, settings, policies, and procedures.  Patient's individual exercise prescription and treatment plan were reviewed.  All starting workloads were established based on the results of the 6 minute walk test done at initial orientation visit.  The plan for exercise progression was also introduced and progression will be customized based on patient's performance and goals.                Exercise Goals and Review:   Exercise Goals     Row Name 02/10/21 1625             Exercise Goals   Increase Physical Activity Yes       Intervention Provide advice, education, support and counseling about physical activity/exercise needs.;Develop an individualized exercise prescription for aerobic and resistive training based on initial evaluation findings, risk stratification, comorbidities and participant's personal goals.       Expected Outcomes Short Term: Attend rehab on a regular basis to increase  amount of physical activity.;Long Term: Add in home exercise to make exercise part of routine and to increase amount of physical activity.;Long Term: Exercising regularly at least 3-5 days a week.       Increase Strength and Stamina Yes       Intervention Provide advice, education, support and counseling about physical activity/exercise needs.;Develop an individualized exercise prescription for aerobic and resistive training based on initial evaluation findings, risk stratification, comorbidities and participant's personal goals.       Expected Outcomes Short Term: Increase workloads from initial exercise prescription for resistance, speed, and METs.;Short Term: Perform resistance training exercises routinely during rehab and add in resistance training at home;Long Term: Improve cardiorespiratory fitness, muscular endurance and strength as measured by increased METs and functional capacity (6MWT)       Able to understand and use rate of perceived exertion (RPE) scale Yes       Intervention Provide education and explanation on how to use RPE scale       Expected Outcomes Short Term: Able to use RPE daily in rehab to express subjective intensity level;Long Term:  Able to use RPE to guide intensity level when exercising independently       Able to understand and use Dyspnea scale Yes       Intervention Provide education and explanation on how to use Dyspnea scale       Expected Outcomes Short Term: Able to use Dyspnea scale daily in rehab to express subjective sense of shortness of breath during exertion;Long Term: Able to use Dyspnea scale to guide intensity level when exercising independently       Knowledge and understanding of Target Heart Rate Range (THRR) Yes       Intervention Provide education and explanation of THRR including how the numbers were predicted and where they are located for reference       Expected Outcomes Short Term: Able to state/look up THRR;Short Term: Able to use daily as  guideline for intensity in rehab;Long Term: Able to use THRR to govern intensity  when exercising independently       Able to check pulse independently Yes       Intervention Provide education and demonstration on how to check pulse in carotid and radial arteries.;Review the importance of being able to check your own pulse for safety during independent exercise       Expected Outcomes Short Term: Able to explain why pulse checking is important during independent exercise;Long Term: Able to check pulse independently and accurately       Understanding of Exercise Prescription Yes       Intervention Provide education, explanation, and written materials on patient's individual exercise prescription       Expected Outcomes Short Term: Able to explain program exercise prescription;Long Term: Able to explain home exercise prescription to exercise independently                Exercise Goals Re-Evaluation :  Exercise Goals Re-Evaluation     Row Name 02/15/21 0946 02/28/21 1001 03/15/21 0824         Exercise Goal Re-Evaluation   Exercise Goals Review Increase Physical Activity;Able to understand and use rate of perceived exertion (RPE) scale;Knowledge and understanding of Target Heart Rate Range (THRR);Understanding of Exercise Prescription;Increase Strength and Stamina;Able to understand and use Dyspnea scale;Able to check pulse independently Increase Physical Activity;Increase Strength and Stamina Increase Physical Activity;Increase Strength and Stamina     Comments Reviewed RPE and dyspnea scales, THR and program prescription with pt today.  Pt voiced understanding and was given a copy of goals to take home. Lovey Newcomer is doing well the first couple of sessions she has been in rehab. She is currently tolerating her initial exercise prescription well and will continue to monitor for progression as she progresses through the program. 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  She has had times where  her legs "give out".  Staff will encourage trying to increase level on NS tohelp with leg strength.     Expected Outcomes Short: Use RPE daily to regulate intensity. Long: Follow program prescription in THR. Short: Continue current exercise prescription Long: Increase overall MET level Short: increase NS Long:  improve leg strength              Discharge Exercise Prescription (Final Exercise Prescription Changes):  Exercise Prescription Changes - 03/15/21 0800       Response to Exercise   Blood Pressure (Admit) 126/64    Blood Pressure (Exercise) 124/70    Blood Pressure (Exit) 118/68    Heart Rate (Admit) 87 bpm    Heart Rate (Exercise) 101 bpm    Oxygen Saturation (Admit) 96 %    Rating of Perceived Exertion (Exercise) 13    Duration Progress to 30 minutes of  aerobic without signs/symptoms of physical distress    Intensity THRR unchanged      Progression   Progression Continue to progress workloads to maintain intensity without signs/symptoms of physical distress.    Average METs 2.2      Resistance Training   Training Prescription Yes    Weight 3 lb    Reps 10-15      Interval Training   Interval Training No      Treadmill   MPH 1.5    Minutes 15    METs 2.15      NuStep   Level 1    Minutes 15    METs 2.2      Oxygen   Maintain Oxygen Saturation 88% or higher  Nutrition:  Target Goals: Understanding of nutrition guidelines, daily intake of sodium <1560m, cholesterol <2014m calories 30% from fat and 7% or less from saturated fats, daily to have 5 or more servings of fruits and vegetables.  Education: All About Nutrition: -Group instruction provided by verbal, written material, interactive activities, discussions, models, and posters to present general guidelines for heart healthy nutrition including fat, fiber, MyPlate, the role of sodium in heart healthy nutrition, utilization of the nutrition label, and utilization of this knowledge for  meal planning. Follow up email sent as well. Written material given at graduation. Flowsheet Row Pulmonary Rehab from 03/03/2021 in [facility] Cardiac and Pulmonary Rehab  Date 02/17/21  [facility] Center[facility]ruction Review Code 1- Verbalizes Understanding       Biometrics:  Pre Biometrics - 02/10/21 1625       Pre Biometrics   Height 5' 10.5" (1.791 m)    Weight 200 lb 12.8 oz (91.1 kg)    BMI (Calculated) 28.39    Single Leg Stand 3.18 seconds              Nutrition Therapy Plan and Nutrition Goals:  Nutrition Therapy & Goals - 02/23/21 1003       Nutrition Therapy   Diet Heart healthy, low Na    Protein (specify units) 70g    Fiber 25 grams    Whole Grain Foods 3 servings    Saturated Fats 12 max. grams    Fruits and Vegetables 8 servings/day    Sodium 1.5 grams      Personal Nutrition Goals   Nutrition Goal ST: make sure getting whole grains in breakfast (whole grain tortilla, whole grain cereal), when making meals at home continue to focus on whole grains, healthy oils/fat, low salt, and variety LT: make sure meeting protien and calories needs during the day, make sure at least half of grains consumed are whole grains    Comments She is trying to cut out sugar and salt and she is eating many vegetables. She has reflux and she eats before 6pm. She has a lower appetite. B: cereal with nuts and a caramel cappichino or yogurt with nuts or banana with tortilla and peanut butter or oatmeal or grits or biscuit from [employer] S: fruit with cheese L/D 2-3pm: big meals. Goes out to eat or eats at home and leftovers the next day. Sandwiches, soups, pasta, salad (she tries to eat as much salad as possible as long as it works for her IBS - she is on a new medicaiton for that which is helping)  S: fruit  Drinks:  She went to nutrition education last week and has no questions regarding heart healthy eating. Discussed how eating and cooking at home gives more control over salt, types of fat like  healthy oils, variety, and less whole grains. She does not want to be on any kind of diet, but is willing to make small modifications. She does not want to limit going out to eat.      Intervention Plan   Intervention Prescribe, educate and counsel regarding individualized specific dietary modifications aiming towards targeted core components such as weight, hypertension, lipid management, diabetes, heart failure and other comorbidities.;Nutrition handout(s) given to patient.    Expected Outcomes Short Term Goal: Understand basic principles of dietary content, such as calories, fat, sodium, cholesterol and nutrients.;Short Term Goal: A plan has been developed with personal nutrition goals set during dietitian appointment.;Long Term Goal: Adherence to prescribed nutrition plan.  Nutrition Assessments:  MEDIFICTS Score Key: ?70 Need to make dietary changes  40-70 Heart Healthy Diet ? 40 Therapeutic Level Cholesterol Diet  Flowsheet Row Pulmonary Rehab from [facility]/10/2021 [facility] [facility]  Picture Your Plate Total Score on Admission 61      Picture Your Plate Scores: <29 Unhealthy dietary pattern with much room for improvement. 41-50 Dietary pattern unlikely to meet recommendations for good health and room for improvement. 51-60 More healthful dietary pattern, with some room for improvement.  >60 Healthy dietary pattern, although there may be some specific behaviors that could be improved.   Nutrition Goals Re-Evaluation:   Nutrition Goals Discharge (Final Nutrition Goals Re-Evaluation):   Psychosocial: Target Goals: Acknowledge presence or absence of significant depression and/or stress, maximize coping skills, provide positive support system. Participant is able to verbalize types and ability to use techniques and skills needed for reducing stress and depression.   Education: Stress, Anxiety, and Depression - Group verbal and visual presentation to  define topics covered.  Reviews how body is impacted by stress, anxiety, and depression.  Also discusses healthy ways to reduce stress and to treat/manage anxiety and depression.  Written material given at graduation.   Education: Sleep Hygiene -Provides group verbal and written instruction about how sleep can affect your health.  Define sleep hygiene, discuss sleep cycles and impact of sleep habits. Review good sleep hygiene tips.    Initial Review & Psychosocial Screening:  Initial Psych Review & Screening - 01/25/21 0949       Initial Review   Current issues with Current Psychotropic Meds;Current Stress Concerns    Source of Stress Concerns Unable to perform yard/household activities    Comments Stress unable to do ususal chores.  Takes more energy for ADLs      Family Dynamics   Good Support System? Yes   Family, friends church family   Comments Husband is up and doing, not back to normal.  Had multiple surgeries, infection in foot.      Barriers   Psychosocial barriers to participate in program There are no identifiable barriers or psychosocial needs.      Screening Interventions   Interventions Encouraged to exercise;To provide support and resources with identified psychosocial needs;Provide feedback about the scores to participant    Expected Outcomes Short Term goal: Utilizing psychosocial counselor, staff and physician to assist with identification of specific Stressors or current issues interfering with healing process. Setting desired goal for each stressor or current issue identified.;Long Term Goal: Stressors or current issues are controlled or eliminated.;Short Term goal: Identification and review with participant of any Quality of Life or Depression concerns found by scoring the questionnaire.;Long Term goal: The participant improves quality of Life and PHQ9 Scores as seen by post scores and/or verbalization of changes             Quality of Life Scores:  Scores of  19 and below usually indicate a poorer quality of life in these areas.  A difference of  2-3 points is a clinically meaningful difference.  A difference of 2-3 points in the total score of the Quality of Life Index has been associated with significant improvement in overall quality of life, self-image, physical symptoms, and general health in studies assessing change in quality of life.  PHQ-9: Recent Review Flowsheet Data     Depression screen [facility] 2/9 02/10/2021   Decreased Interest 1   Down, Depressed, Hopeless 0   PHQ - 2 Score 1  Altered sleeping 3    Tired, decreased energy 3   Change in appetite 2    Feeling bad or failure about yourself  3   Trouble concentrating 0   Moving slowly or fidgety/restless 2   Suicidal thoughts 0   PHQ-9 Score 14   Difficult doing work/chores Somewhat difficult      Interpretation of Total Score  Total Score Depression Severity:  1-4 = Minimal depression, 5-9 = Mild depression, 10-14 = Moderate depression, 15-19 = Moderately severe depression, 20-27 = Severe depression   Psychosocial Evaluation and Intervention:  Psychosocial Evaluation - 01/25/21 1008       Psychosocial Evaluation & Interventions   Interventions Encouraged to exercise with the program and follow exercise prescription    Comments Lovey Newcomer had delayed starting the program secondary to her husband requiring surgery and a recovery from infection. She is ready now and looking forward to joining the program. She lives with her husband and has family nearby. She wants to get back to her ability to exercise and is hopeful this will improve her ability to get back to some of her activities that have been hard to accomplish.  She had COVID early 2020.  She feels some of her SOB may be from that infection.  She should do well,as she is ready to get started.    Expected Outcomes STG: Lovey Newcomer is able to attend all scheduled sessions, progress with her exercise. LTG Lovey Newcomer continues her exercise  progression after discharge and has ability to complete some of the activies that she could not complete before attending the program    Continue Psychosocial Services  Follow up required by staff             Psychosocial Re-Evaluation:   Psychosocial Discharge (Final Psychosocial Re-Evaluation):   Education: Education Goals: Education classes will be provided on a weekly basis, covering required topics. Participant will state understanding/return demonstration of topics presented.  Learning Barriers/Preferences:  Learning Barriers/Preferences - 01/25/21 0958       Learning Barriers/Preferences   Learning Barriers None    Learning Preferences None             General Pulmonary Education Topics:  Infection Prevention: - Provides verbal and written material to individual with discussion of infection control including proper hand washing and proper equipment cleaning during exercise session. Flowsheet Row Pulmonary Rehab from 03/03/2021 in [facility]  Date 02/10/21  Educator AS  Instruction Review Code 1- Verbalizes Understanding       Falls Prevention: - Provides verbal and written material to individual with discussion of falls prevention and safety. Flowsheet Row Pulmonary Rehab from 03/03/2021 in [facility]  Date 02/10/21  Educator AS  Instruction Review Code 1- Verbalizes Understanding       Chronic Lung Disease Review: - Group verbal instruction with posters, models, PowerPoint presentations and videos,  to review new updates, new respiratory medications, new advancements in procedures and treatments. Providing information on websites and "800" numbers for continued self-education. Includes information about supplement oxygen, available portable oxygen systems, continuous and intermittent flow rates, oxygen safety, concentrators, and Medicare reimbursement for oxygen. Explanation of Pulmonary Drugs, including class,  frequency, complications, importance of spacers, rinsing mouth after steroid MDI's, and proper cleaning methods for nebulizers. Review of basic lung anatomy and physiology related to function, structure, and complications of lung disease. Review of risk factors. Discussion about methods for diagnosing sleep apnea and types of masks and machines for  OSA. Includes a review of the use of types of environmental controls: home humidity, furnaces, filters, dust mite/pet prevention, HEPA vacuums. Discussion about weather changes, air quality and the benefits of nasal washing. Instruction on Warning signs, infection symptoms, calling MD promptly, preventive modes, and value of vaccinations. Review of effective airway clearance, coughing and/or vibration techniques. Emphasizing that all should Create an Action Plan. Written material given at graduation. Flowsheet Row Pulmonary Rehab from 03/03/2021 in [facility]  Education need identified 02/10/21       AED/CPR: - Group verbal and written instruction with the use of models to demonstrate the basic use of the AED with the basic ABC's of resuscitation.    Anatomy and Cardiac Procedures: - Group verbal and visual presentation and models provide information about basic cardiac anatomy and function. Reviews the testing methods done to diagnose heart disease and the outcomes of the test results. Describes the treatment choices: Medical Management, Angioplasty, or Coronary Bypass Surgery for treating various heart conditions including Myocardial Infarction, Angina, Valve Disease, and Cardiac Arrhythmias.  Written material given at graduation.   Medication Safety: - Group verbal and visual instruction to review commonly prescribed medications for heart and lung disease. Reviews the medication, class of the drug, and side effects. Includes the steps to properly store meds and maintain the prescription regimen.  Written material given at  graduation.   Other: -Provides group and verbal instruction on various topics (see comments)   Knowledge Questionnaire Score:  Knowledge Questionnaire Score - 02/10/21 1628       Knowledge Questionnaire Score   Pre Score 15/18              Core Components/Risk Factors/Patient Goals at Admission:  Personal Goals and Risk Factors at Admission - 02/10/21 1630       Core Components/Risk Factors/Patient Goals on Admission    Weight Management Yes;Weight Loss    Intervention Weight Management: Develop a combined nutrition and exercise program designed to reach desired caloric intake, while maintaining appropriate intake of nutrient and fiber, sodium and fats, and appropriate energy expenditure required for the weight goal.;Weight Management: Provide education and appropriate resources to help participant work on and attain dietary goals.    Admit Weight 200 lb 12.8 oz (91.1 kg)    Goal Weight: Short Term 195 lb (88.5 kg)    Goal Weight: Long Term 190 lb (86.2 kg)    Expected Outcomes Short Term: Continue to assess and modify interventions until short term weight is achieved;Long Term: Adherence to nutrition and physical activity/exercise program aimed toward attainment of established weight goal;Weight Loss: Understanding of general recommendations for a balanced deficit meal plan, which promotes 1-2 lb weight loss per week and includes a negative energy balance of [phone] kcal/d    Improve shortness of breath with ADL's Yes    Intervention Provide education, individualized exercise plan and daily activity instruction to help decrease symptoms of SOB with activities of daily living.    Expected Outcomes Short Term: Improve cardiorespiratory fitness to achieve a reduction of symptoms when performing ADLs;Long Term: Be able to perform more ADLs without symptoms or delay the onset of symptoms    Increase knowledge of respiratory medications and ability to use respiratory devices properly   Yes    Intervention Provide education and demonstration as needed of appropriate use of medications, inhalers, and oxygen therapy.    Expected Outcomes Short Term: Achieves understanding of medications use. Understands that oxygen is a medication prescribed by  physician. Demonstrates appropriate use of inhaler and oxygen therapy.;Long Term: Maintain appropriate use of medications, inhalers, and oxygen therapy.    Hypertension Yes    Intervention Provide education on lifestyle modifcations including regular physical activity/exercise, weight management, moderate sodium restriction and increased consumption of fresh fruit, vegetables, and low fat dairy, alcohol moderation, and smoking cessation.;Monitor prescription use compliance.    Expected Outcomes Short Term: Continued assessment and intervention until BP is < 140/47m HG in hypertensive participants. < 130/850mHG in hypertensive participants with diabetes, heart failure or chronic kidney disease.;Long Term: Maintenance of blood pressure at goal levels.             Education:Diabetes - Individual verbal and written instruction to review signs/symptoms of diabetes, desired ranges of glucose level fasting, after meals and with exercise. Acknowledge that pre and post exercise glucose checks will be done for 3 sessions at entry of program.   Know Your Numbers and Heart Failure: - Group verbal and visual instruction to discuss disease risk factors for cardiac and pulmonary disease and treatment options.  Reviews associated critical values for Overweight/Obesity, Hypertension, Cholesterol, and Diabetes.  Discusses basics of heart failure: signs/symptoms and treatments.  Introduces Heart Failure Zone chart for action plan for heart failure.  Written material given at graduation. Flow[facility]  Date 03/03/21  Educator SB  Instruction Review Code 1- Verbalizes Understanding        Core Components/Risk Factors/Patient Goals Review:    Core Components/Risk Factors/Patient Goals at Discharge (Final Review):    ITP Comments:  ITP Comments     Row Name 01/25/21 1017 02/10/21 1639 02/15/21 0944 02/23/21 1025 03/02/21 0620   ITP Comments Virtual orientation call completed today. shehas an appointment on Date: 02/10/2021  for EP eval and gym Orientation.  [facility] . Completed 6MWT and gym orientation. Initial ITP created and sent for review to Dr.Fuad AlLanney GinsD, Medical Director. First full day of exercise!  Patient was oriented to gym and equipment including functions, settings, policies, and procedures.  Patient's individual exercise prescription and treatment plan were reviewed.  All starting workloads were established based on the results of the 6 minute walk test done at initial orientation visit.  The plan for exercise progression was also introduced and progression will be customized based on patient's performance and goals. Completed initial RD consultation 30 Day review completed. Medical Director ITP review done, changes made as directed, and signed approval by Medical Director.    Ro[place] Pointame 03/29/21 1352 03/30/21 0641         ITP Comments Sandy fell and is having knee issues.  She is walking with a walker - she broke her tail bone so she cant do seated machines. She sees her PCP and orthopedic Dr next week.  She will let usKoreanow next week if she can continue after she sees them. 30 Day review completed. Medical Director ITP review done, changes made as directed, and signed approval by Medical Director.   2 visits only               Comments:

## 2021-04-05 DIAGNOSIS — R739 Hyperglycemia, unspecified: Secondary | ICD-10-CM | POA: Diagnosis not present

## 2021-04-05 DIAGNOSIS — F411 Generalized anxiety disorder: Secondary | ICD-10-CM | POA: Diagnosis not present

## 2021-04-05 DIAGNOSIS — I129 Hypertensive chronic kidney disease with stage 1 through stage 4 chronic kidney disease, or unspecified chronic kidney disease: Secondary | ICD-10-CM | POA: Diagnosis not present

## 2021-04-05 DIAGNOSIS — E039 Hypothyroidism, unspecified: Secondary | ICD-10-CM | POA: Diagnosis not present

## 2021-04-05 DIAGNOSIS — Z7901 Long term (current) use of anticoagulants: Secondary | ICD-10-CM | POA: Diagnosis not present

## 2021-04-05 DIAGNOSIS — E785 Hyperlipidemia, unspecified: Secondary | ICD-10-CM | POA: Diagnosis not present

## 2021-04-05 DIAGNOSIS — I4891 Unspecified atrial fibrillation: Secondary | ICD-10-CM | POA: Diagnosis not present

## 2021-04-05 DIAGNOSIS — I5032 Chronic diastolic (congestive) heart failure: Secondary | ICD-10-CM | POA: Diagnosis not present

## 2021-04-05 DIAGNOSIS — N1831 Chronic kidney disease, stage 3a: Secondary | ICD-10-CM | POA: Diagnosis not present

## 2021-04-06 DIAGNOSIS — S8000XA Contusion of unspecified knee, initial encounter: Secondary | ICD-10-CM | POA: Diagnosis not present

## 2021-04-11 ENCOUNTER — Encounter: Payer: Self-pay | Admitting: *Deleted

## 2021-04-11 ENCOUNTER — Telehealth: Payer: Self-pay | Admitting: *Deleted

## 2021-04-11 DIAGNOSIS — R0609 Other forms of dyspnea: Secondary | ICD-10-CM

## 2021-04-11 NOTE — Telephone Encounter (Signed)
Brandy Golden called to let us know that her balance is really off since her fall and that they want her to do PT for her leg.  She has her consult scheduled for 04/19/20.  We will put her on medical hold but follow up with her for how long she will be in PT.

## 2021-04-14 DIAGNOSIS — Z950 Presence of cardiac pacemaker: Secondary | ICD-10-CM | POA: Diagnosis not present

## 2021-04-14 DIAGNOSIS — R0609 Other forms of dyspnea: Secondary | ICD-10-CM | POA: Diagnosis not present

## 2021-04-14 DIAGNOSIS — I10 Essential (primary) hypertension: Secondary | ICD-10-CM | POA: Diagnosis not present

## 2021-04-19 DIAGNOSIS — M25562 Pain in left knee: Secondary | ICD-10-CM | POA: Diagnosis not present

## 2021-04-19 DIAGNOSIS — M25561 Pain in right knee: Secondary | ICD-10-CM | POA: Diagnosis not present

## 2021-04-21 DIAGNOSIS — M25561 Pain in right knee: Secondary | ICD-10-CM | POA: Diagnosis not present

## 2021-04-21 DIAGNOSIS — M25562 Pain in left knee: Secondary | ICD-10-CM | POA: Diagnosis not present

## 2021-04-25 ENCOUNTER — Telehealth: Payer: Self-pay

## 2021-04-25 NOTE — Telephone Encounter (Signed)
Spoke with patient who stated she started PT 2 weeks ago. Per patient, she was told she may need to complete it for another 3 months but wasn't sure. She is going to call us next week to verify the length of time she will need to attend. If it is 3 months, we will have to discharge patient and get new referral when cleared to come back. Patient understood.

## 2021-04-27 ENCOUNTER — Encounter: Payer: Self-pay | Admitting: *Deleted

## 2021-04-27 DIAGNOSIS — R0609 Other forms of dyspnea: Secondary | ICD-10-CM

## 2021-04-27 NOTE — Progress Notes (Signed)
Pulmonary Individual Treatment Plan  Patient Details  Name: Brandy Golden MRN: [id number] Date of Birth: 1947/11/21Referring Provider:   Flowsheet Row Pulmonary Rehab from 02/10/2021 in [facility] Cardiac and Pulmonary Rehab  Referring Provider Aleskerov       Initial Encounter Date:  Flowsheet Row Pulmonary Rehab from 02/10/2021 in [facility] Cardiac and Pulmonary Rehab  Date 02/10/21       Visit Diagnosis: DOE (dyspnea on exertion)  Patient's Home Medications on Admission:  Current Outpatient Medications:    ALPRAZolam (XANAX) 0.5 MG tablet, Take 0.5 mg by mouth 2 (two) times daily as needed for anxiety or sleep., Disp: , Rfl:    Biotin 5 MG CAPS, Take 5 mg by mouth 2 (two) times daily., Disp: , Rfl:    Ca Carbonate-Mag Hydroxide (ROLAIDS PO), Take 2 each by mouth daily as needed (for acid reflux)., Disp: , Rfl:    Calcium Carb-Cholecalciferol (CALCIUM + VITAMIN D3 PO), Take 2 tablets by mouth 2 (two) times daily., Disp: , Rfl:    Cholecalciferol (VITAMIN D3) 1000 units CAPS, Take 1,000 Units by mouth daily., Disp: , Rfl:    colestipol (COLESTID) 1 g tablet, Take 2 tablets (2 g total) by mouth 2 (two) times daily., Disp: 180 tablet, Rfl: 3   ELIQUIS 5 MG TABS tablet, Take 5 mg by mouth 2 (two) times daily., Disp: , Rfl:    fluticasone (FLONASE) 50 MCG/ACT nasal spray, TAKE 2 SPRAYS INTO EACH NOSTRIL AT BEDTIME, Disp: , Rfl:    lisinopril (PRINIVIL,ZESTRIL) 10 MG tablet, Take 10 mg by mouth daily., Disp: , Rfl:    Multiple Vitamin (MULTI-VITAMIN DAILY PO), Take 1 tablet by mouth daily., Disp: , Rfl:    omeprazole (PRILOSEC) 40 MG capsule, Take 1 capsule (40 mg total) by mouth 2 (two) times daily before a meal., Disp: 180 capsule, Rfl: 3   sertraline (ZOLOFT) 100 MG tablet, Take 100 mg by mouth daily., Disp: , Rfl:    sodium fluoride (FLUORISHIELD) 1.1 % GEL dental gel, Place onto teeth., Disp: , Rfl:    SYNTHROID 125 MCG tablet, Take 125 mcg by mouth daily before breakfast. , Disp: , Rfl:     vitamin C (ASCORBIC ACID) 500 MG tablet, Take 1,000 mg by mouth daily. , Disp: , Rfl:    zinc gluconate 50 MG tablet, Take 50 mg by mouth daily., Disp: , Rfl:   Past Medical History: Past Medical History:  Diagnosis Date   Allergy    Anemia, iron deficiency    Anxiety    Atrial fibrillation ([place]) 01/2021   Benign essential tremor    Blood transfusion without reported diagnosis    Cancer ([place])    bacal cell nd squmous cell skin cancers   Cataract    Chronic kidney disease    Colon polyps    Depression    Diverticulosis    Dysphonia    Dysrhythmia    Fibrocystic Breast disease    pt denies   Gastric ulcer 2006   Gastrointestinal hemorrhage, hx of    GERD (gastroesophageal reflux disease)    History of COVID-19    History of hiatal hernia    Hx of basal cell carcinoma    and squamous cell skin cancer   Hyperlipidemia    Hypertension    Hypothyroidism    IBS (irritable bowel syndrome)    Internal hemorrhoids without mention of complication    LBBB (left bundle branch block)    Osteoarthritis    Overactive bladder  Stroke [facility]) 03/2018   Unspecified hypothyroidism    Vitamin D deficiency     Tobacco Use: Social History   Tobacco Use  Smoking Status Never  Smokeless Tobacco Never    Labs: Recent Review Flowsheet Data   There is no flowsheet data to display.      Pulmonary Assessment Scores:  Pulmonary Assessment Scores     Row Name 02/10/21 1628         ADL UCSD   SOB Score total 74     Rest 0     Walk 3     Stairs 5     Bath 3     Dress 3     Shop 3       CAT Score   CAT Score 21       mMRC Score   mMRC Score 1              UCSD: Self-administered rating of dyspnea associated with activities of daily living (ADLs) 6-point scale (0 = "not at all" to 5 = "maximal or unable to do because of breathlessness")  Scoring Scores range from 0 to 120.  Minimally important difference is 5 units  CAT: CAT can identify the health impairment of  COPD patients and is better correlated with disease progression.  CAT has a scoring range of zero to 40. The CAT score is classified into four groups of low (less than 10), medium (10 - 20), high (21-30) and very high (31-40) based on the impact level of disease on health status. A CAT score over 10 suggests significant symptoms.  A worsening CAT score could be explained by an exacerbation, poor medication adherence, poor inhaler technique, or progression of COPD or comorbid conditions.  CAT MCID is 2 points  mMRC: mMRC (Modified Medical Research Council) Dyspnea Scale is used to assess the degree of baseline functional disability in patients of respiratory disease due to dyspnea. No minimal important difference is established. A decrease in score of 1 point or greater is considered a positive change.   Pulmonary Function Assessment:   Exercise Target Goals: Exercise Program Goal: Individual exercise prescription set using results from initial 6 min walk test and THRR while considering  patients activity barriers and safety.   Exercise Prescription Goal: Initial exercise prescription builds to 30-45 minutes a day of aerobic activity, 2-3 days per week.  Home exercise guidelines will be given to patient during program as part of exercise prescription that the participant will acknowledge.  Education: Aerobic Exercise: - Group verbal and visual presentation on the components of exercise prescription. Introduces F.I.T.T principle from ACSM for exercise prescriptions.  Reviews F.I.T.T. principles of aerobic exercise including progression. Written material given at graduation.   Education: Resistance Exercise: - Group verbal and visual presentation on the components of exercise prescription. Introduces F.I.T.T principle from ACSM for exercise prescriptions  Reviews F.I.T.T. principles of resistance exercise including progression. Written material given at graduation.    Education: Exercise &  Equipment Safety: - Individual verbal instruction and demonstration of equipment use and safety with use of the equipment. Flowsheet Row Pulmonary Rehab from 03/03/2021 in [facility] Cardiac and Pulmonary Rehab  Date 02/10/21  Educator AS  Instruction Review Code 1- Verbalizes Understanding       Education: Exercise Physiology & General Exercise Guidelines: - Group verbal and written instruction with models to review the exercise physiology of the cardiovascular system and associated critical values. Provides general exercise guidelines with specific guidelines to those  with heart or lung disease.    Education: Flexibility, Balance, Mind/Body Relaxation: - Group verbal and visual presentation with interactive activity on the components of exercise prescription. Introduces F.I.T.T principle from ACSM for exercise prescriptions. Reviews F.I.T.T. principles of flexibility and balance exercise training including progression. Also discusses the mind body connection.  Reviews various relaxation techniques to help reduce and manage stress (i.e. Deep breathing, progressive muscle relaxation, and visualization). Balance handout provided to take home. Written material given at graduation.   Activity Barriers & Risk Stratification:  Activity Barriers & Cardiac Risk Stratification - 01/25/21 0941       Activity Barriers & Cardiac Risk Stratification   Activity Barriers Muscular Weakness;Shortness of Breath             6 Minute Walk:  6 Minute Walk     Row Name 02/10/21 1616         6 Minute Walk   Phase Initial     Distance 862 feet     Walk Time 6 minutes     # of Rest Breaks 0     MPH 1.63     METS 1.9     RPE 11     Perceived Dyspnea  2     VO2 Peak 6.6     Symptoms Yes (comment)     Comments SOB     Resting HR 98 bpm     Resting BP 122/70     Resting Oxygen Saturation  96 %     Exercise Oxygen Saturation  during 6 min walk 94 %     Max Ex. HR 112 bpm     Max Ex. BP 134/74      2 Minute Post BP 114/66       Interval HR   1 Minute HR 96     2 Minute HR 107     3 Minute HR 109     4 Minute HR 107     5 Minute HR 109     6 Minute HR 112     2 Minute Post HR 87     Interval Heart Rate? Yes       Interval Oxygen   Interval Oxygen? Yes     Baseline Oxygen Saturation % 96 %     1 Minute Oxygen Saturation % 94 %     1 Minute Liters of Oxygen 0 L     2 Minute Oxygen Saturation % 94 %     2 Minute Liters of Oxygen 0 L     3 Minute Oxygen Saturation % 95 %     3 Minute Liters of Oxygen 0 L     4 Minute Oxygen Saturation % 95 %     4 Minute Liters of Oxygen 0 L     5 Minute Oxygen Saturation % 94 %     5 Minute Liters of Oxygen 0 L     6 Minute Oxygen Saturation % 94 %     6 Minute Liters of Oxygen 0 L     2 Minute Post Oxygen Saturation % 96 %     2 Minute Post Liters of Oxygen 0 L             Oxygen Initial Assessment:  Oxygen Initial Assessment - 02/10/21 1711       Home Oxygen   Home Oxygen Device None    Sleep Oxygen Prescription None    Home Exercise Oxygen Prescription None  Home Resting Oxygen Prescription None    Compliance with Home Oxygen Use Yes      Initial 6 min Walk   Oxygen Used None      Program Oxygen Prescription   Program Oxygen Prescription None      Intervention   Short Term Goals To learn and demonstrate proper use of respiratory medications;To learn and demonstrate proper pursed lip breathing techniques or other breathing techniques. ;To learn and understand importance of monitoring SPO2 with pulse oximeter and demonstrate accurate use of the pulse oximeter.    Long  Term Goals Compliance with respiratory medication;Exhibits proper breathing techniques, such as pursed lip breathing or other method taught during program session;Demonstrates proper use of MDIs             Oxygen Re-Evaluation:  Oxygen Re-Evaluation     Row Name 02/15/21 0948             Program Oxygen Prescription   Program Oxygen  Prescription None         Home Oxygen   Home Oxygen Device None       Sleep Oxygen Prescription None       Home Exercise Oxygen Prescription None       Home Resting Oxygen Prescription None       Compliance with Home Oxygen Use Yes         Goals/Expected Outcomes   Short Term Goals To learn and demonstrate proper pursed lip breathing techniques or other breathing techniques. ;To learn and understand importance of monitoring SPO2 with pulse oximeter and demonstrate accurate use of the pulse oximeter.       Long  Term Goals Compliance with respiratory medication;Exhibits proper breathing techniques, such as pursed lip breathing or other method taught during program session       Comments Reviewed PLB technique with pt.  Talked about how it works and it's importance in maintaining their exercise saturations.       Goals/Expected Outcomes Short: Become more profiecient at using PLB.   Long: Become independent at using PLB.                Oxygen Discharge (Final Oxygen Re-Evaluation):  Oxygen Re-Evaluation - 02/15/21 0948       Program Oxygen Prescription   Program Oxygen Prescription None      Home Oxygen   Home Oxygen Device None    Sleep Oxygen Prescription None    Home Exercise Oxygen Prescription None    Home Resting Oxygen Prescription None    Compliance with Home Oxygen Use Yes      Goals/Expected Outcomes   Short Term Goals To learn and demonstrate proper pursed lip breathing techniques or other breathing techniques. ;To learn and understand importance of monitoring SPO2 with pulse oximeter and demonstrate accurate use of the pulse oximeter.    Long  Term Goals Compliance with respiratory medication;Exhibits proper breathing techniques, such as pursed lip breathing or other method taught during program session    Comments Reviewed PLB technique with pt.  Talked about how it works and it's importance in maintaining their exercise saturations.    Goals/Expected Outcomes Short:  Become more profiecient at using PLB.   Long: Become independent at using PLB.             Initial Exercise Prescription:  Initial Exercise Prescription - 02/10/21 1600       Date of Initial Exercise RX and Referring Provider   Date 02/10/21    Referring Provider  Aleskerov      Oxygen   Maintain Oxygen Saturation 88% or higher      Treadmill   MPH 1.3    Grade 0    Minutes 15    METs 2      NuStep   Level 1    SPM 80    Minutes 15    METs 1.9      REL-XR   Level 1    Speed 50    Minutes 15    METs 1.9      Track   Laps 16    Minutes 15    METs 1.9      Prescription Details   Frequency (times per week) 3    Duration Progress to 30 minutes of continuous aerobic without signs/symptoms of physical distress      Intensity   THRR 40-80% of Max Heartrate 116-134    Ratings of Perceived Exertion 11-15    Perceived Dyspnea 0-4      Resistance Training   Training Prescription Yes    Weight 3 lb    Reps 10-15             Perform Capillary Blood Glucose checks as needed.  Exercise Prescription Changes:   Exercise Prescription Changes     Row Name 02/10/21 1600 02/28/21 1000 03/15/21 0800         Response to Exercise   Blood Pressure (Admit) 122/70 112/62 126/64     Blood Pressure (Exercise) 134/74 134/68 124/70     Blood Pressure (Exit) 114/66 102/62 118/68     Heart Rate (Admit) 98 bpm 100 bpm 87 bpm     Heart Rate (Exercise) 112 bpm 118 bpm 101 bpm     Heart Rate (Exit) 87 bpm 109 bpm --     Oxygen Saturation (Admit) 96 % 97 % 96 %     Oxygen Saturation (Exercise) 94 % 94 % --     Oxygen Saturation (Exit) 96 % 96 % --     Rating of Perceived Exertion (Exercise) _0 Perceived Dyspnea (Exercise) 2 3 --     Symptoms SOB SOB --     Comments -- 3rd full day of exercise --     Duration -- Progress to 30 minutes of  aerobic without signs/symptoms of physical distress Progress to 30 minutes of  aerobic without signs/symptoms of physical  distress     Intensity -- THRR unchanged THRR unchanged       Progression   Progression -- Continue to progress workloads to maintain intensity without signs/symptoms of physical distress. Continue to progress workloads to maintain intensity without signs/symptoms of physical distress.     Average METs -- 1.94 2.2       Resistance Training   Training Prescription -- Yes Yes     Weight -- 3 lb 3 lb     Reps -- 10-15 10-15       Interval Training   Interval Training -- No No       Treadmill   MPH -- 1.3 1.5     Grade -- 0 --     Minutes -- 15 15     METs -- 2 2.15       NuStep   Level -- 1 1     Minutes -- 15 15     METs -- 2.5 2.2       REL-XR   Level -- 1 --  Minutes -- 15 --     METs -- 1 --       Oxygen   Maintain Oxygen Saturation -- 88% or higher 88% or higher              Exercise Comments:   Exercise Comments     Row Name 02/15/21 0946           Exercise Comments First full day of exercise!  Patient was oriented to gym and equipment including functions, settings, policies, and procedures.  Patient's individual exercise prescription and treatment plan were reviewed.  All starting workloads were established based on the results of the 6 minute walk test done at initial orientation visit.  The plan for exercise progression was also introduced and progression will be customized based on patient's performance and goals.                Exercise Goals and Review:   Exercise Goals     Row Name 02/10/21 1625             Exercise Goals   Increase Physical Activity Yes       Intervention Provide advice, education, support and counseling about physical activity/exercise needs.;Develop an individualized exercise prescription for aerobic and resistive training based on initial evaluation findings, risk stratification, comorbidities and participant's personal goals.       Expected Outcomes Short Term: Attend rehab on a regular basis to increase amount  of physical activity.;Long Term: Add in home exercise to make exercise part of routine and to increase amount of physical activity.;Long Term: Exercising regularly at least 3-5 days a week.       Increase Strength and Stamina Yes       Intervention Provide advice, education, support and counseling about physical activity/exercise needs.;Develop an individualized exercise prescription for aerobic and resistive training based on initial evaluation findings, risk stratification, comorbidities and participant's personal goals.       Expected Outcomes Short Term: Increase workloads from initial exercise prescription for resistance, speed, and METs.;Short Term: Perform resistance training exercises routinely during rehab and add in resistance training at home;Long Term: Improve cardiorespiratory fitness, muscular endurance and strength as measured by increased METs and functional capacity (6MWT)       Able to understand and use rate of perceived exertion (RPE) scale Yes       Intervention Provide education and explanation on how to use RPE scale       Expected Outcomes Short Term: Able to use RPE daily in rehab to express subjective intensity level;Long Term:  Able to use RPE to guide intensity level when exercising independently       Able to understand and use Dyspnea scale Yes       Intervention Provide education and explanation on how to use Dyspnea scale       Expected Outcomes Short Term: Able to use Dyspnea scale daily in rehab to express subjective sense of shortness of breath during exertion;Long Term: Able to use Dyspnea scale to guide intensity level when exercising independently       Knowledge and understanding of Target Heart Rate Range (THRR) Yes       Intervention Provide education and explanation of THRR including how the numbers were predicted and where they are located for reference       Expected Outcomes Short Term: Able to state/look up THRR;Short Term: Able to use daily as guideline for  intensity in rehab;Long Term: Able to use THRR to govern intensity  when exercising independently       Able to check pulse independently Yes       Intervention Provide education and demonstration on how to check pulse in carotid and radial arteries.;Review the importance of being able to check your own pulse for safety during independent exercise       Expected Outcomes Short Term: Able to explain why pulse checking is important during independent exercise;Long Term: Able to check pulse independently and accurately       Understanding of Exercise Prescription Yes       Intervention Provide education, explanation, and written materials on patient's individual exercise prescription       Expected Outcomes Short Term: Able to explain program exercise prescription;Long Term: Able to explain home exercise prescription to exercise independently                Exercise Goals Re-Evaluation :  Exercise Goals Re-Evaluation     Row Name 02/15/21 0946 02/28/21 1001 03/15/21 0824 03/30/21 0953       Exercise Goal Re-Evaluation   Exercise Goals Review Increase Physical Activity;Able to understand and use rate of perceived exertion (RPE) scale;Knowledge and understanding of Target Heart Rate Range (THRR);Understanding of Exercise Prescription;Increase Strength and Stamina;Able to understand and use Dyspnea scale;Able to check pulse independently Increase Physical Activity;Increase Strength and Stamina Increase Physical Activity;Increase Strength and Stamina --    Comments Reviewed RPE and dyspnea scales, THR and program prescription with pt today.  Pt voiced understanding and was given a copy of goals to take home. Lovey Newcomer is doing well the first couple of sessions she has been in rehab. She is currently tolerating her initial exercise prescription well and will continue to monitor for progression as she progresses through the program. 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  She has had times where her  legs "give out".  Staff will encourage trying to increase level on NS tohelp with leg strength. Out since last review with COVID and fall on knee    Expected Outcomes Short: Use RPE daily to regulate intensity. Long: Follow program prescription in THR. Short: Continue current exercise prescription Long: Increase overall MET level Short: increase NS Long:  improve leg strength --             Discharge Exercise Prescription (Final Exercise Prescription Changes):  Exercise Prescription Changes - 03/15/21 0800       Response to Exercise   Blood Pressure (Admit) 126/64    Blood Pressure (Exercise) 124/70    Blood Pressure (Exit) 118/68    Heart Rate (Admit) 87 bpm    Heart Rate (Exercise) 101 bpm    Oxygen Saturation (Admit) 96 %    Rating of Perceived Exertion (Exercise) 13    Duration Progress to 30 minutes of  aerobic without signs/symptoms of physical distress    Intensity THRR unchanged      Progression   Progression Continue to progress workloads to maintain intensity without signs/symptoms of physical distress.    Average METs 2.2      Resistance Training   Training Prescription Yes    Weight 3 lb    Reps 10-15      Interval Training   Interval Training No      Treadmill   MPH 1.5    Minutes 15    METs 2.15      NuStep   Level 1    Minutes 15    METs 2.2  Oxygen   Maintain Oxygen Saturation 88% or higher             Nutrition:  Target Goals: Understanding of nutrition guidelines, daily intake of sodium <1565m, cholesterol <206m calories 30% from fat and 7% or less from saturated fats, daily to have 5 or more servings of fruits and vegetables.  Education: All About Nutrition: -Group instruction provided by verbal, written material, interactive activities, discussions, models, and posters to present general guidelines for heart healthy nutrition including fat, fiber, MyPlate, the role of sodium in heart healthy nutrition, utilization of the nutrition  label, and utilization of this knowledge for meal planning. Follow up email sent as well. Written material given at graduation. [facility]  Date 02/17/21  [facility] Medical CorporationInstruction Review Code 1- Verbalizes Understanding       Biometrics:  Pre Biometrics - 02/10/21 1625       Pre Biometrics   Height 5' 10.5" (1.791 m)    Weight 200 lb 12.8 oz (91.1 kg)    BMI (Calculated) 28.39    Single Leg Stand 3.18 seconds              Nutrition Therapy Plan and Nutrition Goals:  Nutrition Therapy & Goals - 02/23/21 1003       Nutrition Therapy   Diet Heart healthy, low Na    Protein (specify units) 70g    Fiber 25 grams    Whole Grain Foods 3 servings    Saturated Fats 12 max. grams    Fruits and Vegetables 8 servings/day    Sodium 1.5 grams      Personal Nutrition Goals   Nutrition Goal ST: make sure getting whole grains in breakfast (whole grain tortilla, whole grain cereal), when making meals at home continue to focus on whole grains, healthy oils/fat, low salt, and variety LT: make sure meeting protien and calories needs during the day, make sure at least half of grains consumed are whole grains    Comments She is trying to cut out sugar and salt and she is eating many vegetables. She has reflux and she eats before 6pm. She has a lower appetite. B: cereal with nuts and a caramel cappichino or yogurt with nuts or banana with tortilla and peanut butter or oatmeal or grits or biscuit from [employer] S: fruit with cheese L/D 2-3pm: big meals. Goes out to eat or eats at home and leftovers the next day. Sandwiches, soups, pasta, salad (she tries to eat as much salad as possible as long as it works for her IBS - she is on a new medicaiton for that which is helping)  S: fruit  Drinks:  She went to nutrition education last week and has no questions regarding heart healthy eating. Discussed how eating and cooking at home  gives more control over salt, types of fat like healthy oils, variety, and less whole grains. She does not want to be on any kind of diet, but is willing to make small modifications. She does not want to limit going out to eat.      Intervention Plan   Intervention Prescribe, educate and counsel regarding individualized specific dietary modifications aiming towards targeted core components such as weight, hypertension, lipid management, diabetes, heart failure and other comorbidities.;Nutrition handout(s) given to patient.    Expected Outcomes Short Term Goal: Understand basic principles of dietary content, such as calories, fat, sodium, cholesterol and nutrients.;Short Term Goal: A plan  has been developed with personal nutrition goals set during dietitian appointment.;Long Term Goal: Adherence to prescribed nutrition plan.             Nutrition Assessments:  MEDIFICTS Score Key: ?70 Need to make dietary changes  40-70 Heart Healthy Diet ? 40 Therapeutic Level Cholesterol Diet  Flowsheet Row Pulmonary Rehab from 02/10/2021 in [facility] Cardiac and Pulmonary Rehab  Picture Your Plate Total Score on Admission 61      Picture Your Plate Scores: <95 Unhealthy dietary pattern with much room for improvement. 41-50 Dietary pattern unlikely to meet recommendations for good health and room for improvement. 51-60 More healthful dietary pattern, with some room for improvement.  >60 Healthy dietary pattern, although there may be some specific behaviors that could be improved.   Nutrition Goals Re-Evaluation:   Nutrition Goals Discharge (Final Nutrition Goals Re-Evaluation):   Psychosocial: Target Goals: Acknowledge presence or absence of significant depression and/or stress, maximize coping skills, provide positive support system. Participant is able to verbalize types and ability to use techniques and skills needed for reducing stress and depression.   Education: Stress, Anxiety, and  Depression - Group verbal and visual presentation to define topics covered.  Reviews how body is impacted by stress, anxiety, and depression.  Also discusses healthy ways to reduce stress and to treat/manage anxiety and depression.  Written material given at graduation.   Education: Sleep Hygiene -Provides group verbal and written instruction about how sleep can affect your health.  Define sleep hygiene, discuss sleep cycles and impact of sleep habits. Review good sleep hygiene tips.    Initial Review & Psychosocial Screening:  Initial Psych Review & Screening - 01/25/21 0949       Initial Review   Current issues with Current Psychotropic Meds;Current Stress Concerns    Source of Stress Concerns Unable to perform yard/household activities    Comments Stress unable to do ususal chores.  Takes more energy for ADLs      Family Dynamics   Good Support System? Yes   Family, friends church family   Comments Husband is up and doing, not back to normal.  Had multiple surgeries, infection in foot.      Barriers   Psychosocial barriers to participate in program There are no identifiable barriers or psychosocial needs.      Screening Interventions   Interventions Encouraged to exercise;To provide support and resources with identified psychosocial needs;Provide feedback about the scores to participant    Expected Outcomes Short Term goal: Utilizing psychosocial counselor, staff and physician to assist with identification of specific Stressors or current issues interfering with healing process. Setting desired goal for each stressor or current issue identified.;Long Term Goal: Stressors or current issues are controlled or eliminated.;Short Term goal: Identification and review with participant of any Quality of Life or Depression concerns found by scoring the questionnaire.;Long Term goal: The participant improves quality of Life and PHQ9 Scores as seen by post scores and/or verbalization of changes              Quality of Life Scores:  Scores of 19 and below usually indicate a poorer quality of life in these areas.  A difference of  2-3 points is a clinically meaningful difference.  A difference of 2-3 points in the total score of the Quality of Life Index has been associated with significant improvement in overall quality of life, self-image, physical symptoms, and general health in studies assessing change in quality of life.  PHQ-9: Recent Review Flowsheet  Data     Depression screen [facility] 2/9 02/10/2021   Decreased Interest 1   Down, Depressed, Hopeless 0   PHQ - 2 Score 1   Altered sleeping 3    Tired, decreased energy 3   Change in appetite 2    Feeling bad or failure about yourself  3   Trouble concentrating 0   Moving slowly or fidgety/restless 2   Suicidal thoughts 0   PHQ-9 Score 14   Difficult doing work/chores Somewhat difficult      Interpretation of Total Score  Total Score Depression Severity:  1-4 = Minimal depression, 5-9 = Mild depression, 10-14 = Moderate depression, 15-19 = Moderately severe depression, 20-27 = Severe depression   Psychosocial Evaluation and Intervention:  Psychosocial Evaluation - 01/25/21 1008       Psychosocial Evaluation & Interventions   Interventions Encouraged to exercise with the program and follow exercise prescription    Comments Lovey Newcomer had delayed starting the program secondary to her husband requiring surgery and a recovery from infection. She is ready now and looking forward to joining the program. She lives with her husband and has family nearby. She wants to get back to her ability to exercise and is hopeful this will improve her ability to get back to some of her activities that have been hard to accomplish.  She had COVID early 2020.  She feels some of her SOB may be from that infection.  She should do well,as she is ready to get started.    Expected Outcomes STG: Lovey Newcomer is able to attend all scheduled sessions, progress  with her exercise. LTG Lovey Newcomer continues her exercise progression after discharge and has ability to complete some of the activies that she could not complete before attending the program    Continue Psychosocial Services  Follow up required by staff             Psychosocial Re-Evaluation:   Psychosocial Discharge (Final Psychosocial Re-Evaluation):   Education: Education Goals: Education classes will be provided on a weekly basis, covering required topics. Participant will state understanding/return demonstration of topics presented.  Learning Barriers/Preferences:  Learning Barriers/Preferences - 01/25/21 0958       Learning Barriers/Preferences   Learning Barriers None    Learning Preferences None             General Pulmonary Education Topics:  Infection Prevention: - Provides verbal and written material to individual with discussion of infection control including proper hand washing and proper equipment cleaning during exercise session. Flowsheet Row Pulmonary Rehab from 03/03/2021 in [facility] Cardiac and Pulmonary Rehab  Date 02/10/21  Educator AS  Instruction Review Code 1- Verbalizes Understanding       Falls Prevention: - Provides verbal and written material to individual with discussion of falls prevention and safety. Flowsheet Row Pulmonary Rehab from 03/03/2021 in [facility] Cardiac and Pulmonary Rehab  Date 02/10/21  Educator AS  Instruction Review Code 1- Verbalizes Understanding       Chronic Lung Disease Review: - Group verbal instruction with posters, models, PowerPoint presentations and videos,  to review new updates, new respiratory medications, new advancements in procedures and treatments. Providing information on websites and "800" numbers for continued self-education. Includes information about supplement oxygen, available portable oxygen systems, continuous and intermittent flow rates, oxygen safety, concentrators, and Medicare reimbursement for  oxygen. Explanation of Pulmonary Drugs, including class, frequency, complications, importance of spacers, rinsing mouth after steroid MDI's, and proper cleaning methods for nebulizers. Review of basic lung  anatomy and physiology related to function, structure, and complications of lung disease. Review of risk factors. Discussion about methods for diagnosing sleep apnea and types of masks and machines for OSA. Includes a review of the use of types of environmental controls: home humidity, furnaces, filters, dust mite/pet prevention, HEPA vacuums. Discussion about weather changes, air quality and the benefits of nasal washing. Instruction on Warning signs, infection symptoms, calling MD promptly, preventive modes, and value of vaccinations. Review of effective airway clearance, coughing and/or vibration techniques. Emphasizing that all should Create an Action Plan. Written material given at graduation. Flowsheet Row Pulmonary Rehab from 03/03/2021 in [facility] Cardiac and Pulmonary Rehab  Education need identified 02/10/21       AED/CPR: - Group verbal and written instruction with the use of models to demonstrate the basic use of the AED with the basic ABC's of resuscitation.    Anatomy and Cardiac Procedures: - Group verbal and visual presentation and models provide information about basic cardiac anatomy and function. Reviews the testing methods done to diagnose heart disease and the outcomes of the test results. Describes the treatment choices: Medical Management, Angioplasty, or Coronary Bypass Surgery for treating various heart conditions including Myocardial Infarction, Angina, Valve Disease, and Cardiac Arrhythmias.  Written material given at graduation.   Medication Safety: - Group verbal and visual instruction to review commonly prescribed medications for heart and lung disease. Reviews the medication, class of the drug, and side effects. Includes the steps to properly store meds and maintain the  prescription regimen.  Written material given at graduation.   Other: -Provides group and verbal instruction on various topics (see comments)   Knowledge Questionnaire Score:  Knowledge Questionnaire Score - 02/10/21 1628       Knowledge Questionnaire Score   Pre Score 15/18              Core Components/Risk Factors/Patient Goals at Admission:  Personal Goals and Risk Factors at Admission - 02/10/21 1630       Core Components/Risk Factors/Patient Goals on Admission    Weight Management Yes;Weight Loss    Intervention Weight Management: Develop a combined nutrition and exercise program designed to reach desired caloric intake, while maintaining appropriate intake of nutrient and fiber, sodium and fats, and appropriate energy expenditure required for the weight goal.;Weight Management: Provide education and appropriate resources to help participant work on and attain dietary goals.    Admit Weight 200 lb 12.8 oz (91.1 kg)    Goal Weight: Short Term 195 lb (88.5 kg)    Goal Weight: Long Term 190 lb (86.2 kg)    Expected Outcomes Short Term: Continue to assess and modify interventions until short term weight is achieved;Long Term: Adherence to nutrition and physical activity/exercise program aimed toward attainment of established weight goal;Weight Loss: Understanding of general recommendations for a balanced deficit meal plan, which promotes 1-2 lb weight loss per week and includes a negative energy balance of [phone] kcal/d    Improve shortness of breath with ADL's Yes    Intervention Provide education, individualized exercise plan and daily activity instruction to help decrease symptoms of SOB with activities of daily living.    Expected Outcomes Short Term: Improve cardiorespiratory fitness to achieve a reduction of symptoms when performing ADLs;Long Term: Be able to perform more ADLs without symptoms or delay the onset of symptoms    Increase knowledge of respiratory medications  and ability to use respiratory devices properly  Yes    Intervention Provide education and demonstration as  needed of appropriate use of medications, inhalers, and oxygen therapy.    Expected Outcomes Short Term: Achieves understanding of medications use. Understands that oxygen is a medication prescribed by physician. Demonstrates appropriate use of inhaler and oxygen therapy.;Long Term: Maintain appropriate use of medications, inhalers, and oxygen therapy.    Hypertension Yes    Intervention Provide education on lifestyle modifcations including regular physical activity/exercise, weight management, moderate sodium restriction and increased consumption of fresh fruit, vegetables, and low fat dairy, alcohol moderation, and smoking cessation.;Monitor prescription use compliance.    Expected Outcomes Short Term: Continued assessment and intervention until BP is < 140/11m HG in hypertensive participants. < 130/872mHG in hypertensive participants with diabetes, heart failure or chronic kidney disease.;Long Term: Maintenance of blood pressure at goal levels.             Education:Diabetes - Individual verbal and written instruction to review signs/symptoms of diabetes, desired ranges of glucose level fasting, after meals and with exercise. Acknowledge that pre and post exercise glucose checks will be done for 3 sessions at entry of program.   Know Your Numbers and Heart Failure: - Group verbal and visual instruction to discuss disease risk factors for cardiac and pulmonary disease and treatment options.  Reviews associated critical values for Overweight/Obesity, Hypertension, Cholesterol, and Diabetes.  Discusses basics of heart failure: signs/symptoms and treatments.  Introduces Heart Failure Zone chart for action plan for heart failure.  Written material given at graduation. Flow[facility]  Date 03/03/21  Educator SB  Instruction  Review Code 1- Verbalizes Understanding       Core Components/Risk Factors/Patient Goals Review:    Core Components/Risk Factors/Patient Goals at Discharge (Final Review):    ITP Comments:  ITP Comments     Row Name 01/25/21 1017 02/10/21 1639 02/15/21 0944 02/23/21 1025 03/02/21 0620   ITP Comments Virtual orientation call completed today. shehas an appointment on Date: 02/10/2021  for EP eval and gym Orientation.  [facility] . Completed 6MWT and gym orientation. Initial ITP created and sent for review to Dr.Fuad AlLanney GinsD, Medical Director. First full day of exercise!  Patient was oriented to gym and equipment including functions, settings, policies, and procedures.  Patient's individual exercise prescription and treatment plan were reviewed.  All starting workloads were established based on the results of the 6 minute walk test done at initial orientation visit.  The plan for exercise progression was also introduced and progression will be customized based on patient's performance and goals. Completed initial RD consultation 30 Day review completed. Medical Director ITP review done, changes made as directed, and signed approval by Medical Director.    [place] 03/29/21 1352 03/30/21 0641 04/11/21 1155 04/25/21 1157 04/27/21 0811   ITP Comments Sandy fell and is having knee issues.  She is walking with a walker - she broke her tail bone so she cant do seated machines. She sees her PCP and orthopedic Dr next week.  She will let usKoreanow next week if she can continue after she sees them. 30 Day review completed. Medical Director ITP review done, changes made as directed, and signed approval by Medical Director.   2 visits only SaLovey Newcomeralled to let usKoreanow that her balance is really off since her fall and that they want her to do PT for her leg.  She has her consult scheduled for 04/19/20.  We will put  her on medical hold but follow up with her  for how long she will be in PT. Spoke with Cleary who stated she started PT 2 weeks ago. They said she may need to complete it for another 3 months but wasn't sure. She is going to call us next week to verify the length of time she will be over there. If it is 3 months, may have to discharge patient and get new referral when cleared to come back. 30 Day review completed. Medical Director ITP review done, changes made as directed, and signed approval by Medical Director.   NO visits this 30 days, out for medical reason            Comments:

## 2021-05-02 ENCOUNTER — Telehealth: Payer: Self-pay

## 2021-05-02 DIAGNOSIS — R0609 Other forms of dyspnea: Secondary | ICD-10-CM

## 2021-05-02 NOTE — Telephone Encounter (Signed)
Patient states she is scheduled with PT until the end of March. Will discharge patient from Pulmonary Rehab at this time and advised her to ask her referring doctor for a new referral when she is cleared to come back after PT. Patient in agreement.

## 2021-05-02 NOTE — Progress Notes (Signed)
Discharge Progress Report  Patient Details  Name: Brandy Golden MRN: [id number] Date of Birth: 02-26-1946 Referring Provider:   Flowsheet Row Pulmonary Rehab from 02/10/2021 in [facility] Cardiac and Pulmonary Rehab  Referring Provider Aleskerov        Number of Visits: 7  Reason for Discharge:  Early Exit:  Personal - Finishing up PT  Smoking History:  Social History   Tobacco Use  Smoking Status Never  Smokeless Tobacco Never    Diagnosis:  DOE (dyspnea on exertion)  ADL UCSD:  Pulmonary Assessment Scores     Row Name 02/10/21 1628         ADL UCSD   SOB Score total 74     Rest 0     Walk 3     Stairs 5     Bath 3     Dress 3     Shop 3       CAT Score   CAT Score 21       mMRC Score   mMRC Score 1              Initial Exercise Prescription:  Initial Exercise Prescription - 02/10/21 1600       Date of Initial Exercise RX and Referring Provider   Date 02/10/21    Referring Provider Aleskerov      Oxygen   Maintain Oxygen Saturation 88% or higher      Treadmill   MPH 1.3    Grade 0    Minutes 15    METs 2      NuStep   Level 1    SPM 80    Minutes 15    METs 1.9      REL-XR   Level 1    Speed 50    Minutes 15    METs 1.9      Track   Laps 16    Minutes 15    METs 1.9      Prescription Details   Frequency (times per week) 3    Duration Progress to 30 minutes of continuous aerobic without signs/symptoms of physical distress      Intensity   THRR 40-80% of Max Heartrate 116-134    Ratings of Perceived Exertion 11-15    Perceived Dyspnea 0-4      Resistance Training   Training Prescription Yes    Weight 3 lb    Reps 10-15             Discharge Exercise Prescription (Final Exercise Prescription Changes):  Exercise Prescription Changes - 03/15/21 0800       Response to Exercise   Blood Pressure (Admit) 126/64    Blood Pressure (Exercise) 124/70    Blood Pressure (Exit) 118/68    Heart Rate (Admit) 87 bpm     Heart Rate (Exercise) 101 bpm    Oxygen Saturation (Admit) 96 %    Rating of Perceived Exertion (Exercise) 13    Duration Progress to 30 minutes of  aerobic without signs/symptoms of physical distress    Intensity THRR unchanged      Progression   Progression Continue to progress workloads to maintain intensity without signs/symptoms of physical distress.    Average METs 2.2      Resistance Training   Training Prescription Yes    Weight 3 lb    Reps 10-15      Interval Training   Interval Training No      Treadmill   MPH  1.5    Minutes 15    METs 2.15      NuStep   Level 1    Minutes 15    METs 2.2      Oxygen   Maintain Oxygen Saturation 88% or higher             Functional Capacity:  6 Minute Walk     Row Name 02/10/21 1616         6 Minute Walk   Phase Initial     Distance 862 feet     Walk Time 6 minutes     # of Rest Breaks 0     MPH 1.63     METS 1.9     RPE 11     Perceived Dyspnea  2     VO2 Peak 6.6     Symptoms Yes (comment)     Comments SOB     Resting HR 98 bpm     Resting BP 122/70     Resting Oxygen Saturation  96 %     Exercise Oxygen Saturation  during 6 min walk 94 %     Max Ex. HR 112 bpm     Max Ex. BP 134/74     2 Minute Post BP 114/66       Interval HR   1 Minute HR 96     2 Minute HR 107     3 Minute HR 109     4 Minute HR 107     5 Minute HR 109     6 Minute HR 112     2 Minute Post HR 87     Interval Heart Rate? Yes       Interval Oxygen   Interval Oxygen? Yes     Baseline Oxygen Saturation % 96 %     1 Minute Oxygen Saturation % 94 %     1 Minute Liters of Oxygen 0 L     2 Minute Oxygen Saturation % 94 %     2 Minute Liters of Oxygen 0 L     3 Minute Oxygen Saturation % 95 %     3 Minute Liters of Oxygen 0 L     4 Minute Oxygen Saturation % 95 %     4 Minute Liters of Oxygen 0 L     5 Minute Oxygen Saturation % 94 %     5 Minute Liters of Oxygen 0 L     6 Minute Oxygen Saturation % 94 %     6 Minute  Liters of Oxygen 0 L     2 Minute Post Oxygen Saturation % 96 %     2 Minute Post Liters of Oxygen 0 L               Nutrition & Weight - Outcomes:  Pre Biometrics - 02/10/21 1625       Pre Biometrics   Height 5' 10.5" (1.791 m)    Weight 200 lb 12.8 oz (91.1 kg)    BMI (Calculated) 28.39    Single Leg Stand 3.18 seconds              Nutrition:  Nutrition Therapy & Goals - 02/23/21 1003       Nutrition Therapy   Diet Heart healthy, low Na    Protein (specify units) 70g    Fiber 25 grams    Whole Grain Foods 3 servings    Saturated Fats 12  max. grams    Fruits and Vegetables 8 servings/day    Sodium 1.5 grams      Personal Nutrition Goals   Nutrition Goal ST: make sure getting whole grains in breakfast (whole grain tortilla, whole grain cereal), when making meals at home continue to focus on whole grains, healthy oils/fat, low salt, and variety LT: make sure meeting protien and calories needs during the day, make sure at least half of grains consumed are whole grains    Comments She is trying to cut out sugar and salt and she is eating many vegetables. She has reflux and she eats before 6pm. She has a lower appetite. B: cereal with nuts and a caramel cappichino or yogurt with nuts or banana with tortilla and peanut butter or oatmeal or grits or biscuit from [employer] S: fruit with cheese L/D 2-3pm: big meals. Goes out to eat or eats at home and leftovers the next day. Sandwiches, soups, pasta, salad (she tries to eat as much salad as possible as long as it works for her IBS - she is on a new medicaiton for that which is helping)  S: fruit  Drinks:  She went to nutrition education last week and has no questions regarding heart healthy eating. Discussed how eating and cooking at home gives more control over salt, types of fat like healthy oils, variety, and less whole grains. She does not want to be on any kind of diet, but is willing to make small modifications. She does  not want to limit going out to eat.      Intervention Plan   Intervention Prescribe, educate and counsel regarding individualized specific dietary modifications aiming towards targeted core components such as weight, hypertension, lipid management, diabetes, heart failure and other comorbidities.;Nutrition handout(s) given to patient.    Expected Outcomes Short Term Goal: Understand basic principles of dietary content, such as calories, fat, sodium, cholesterol and nutrients.;Short Term Goal: A plan has been developed with personal nutrition goals set during dietitian appointment.;Long Term Goal: Adherence to prescribed nutrition plan.               Goals reviewed with patient; copy given to patient.

## 2021-05-02 NOTE — Progress Notes (Signed)
Pulmonary Individual Treatment Plan  Patient Details  Name: Brandy Golden MRN: [id number] Date of Birth: 1946/03/04 Referring Provider:   Flowsheet Row Pulmonary Rehab from 02/10/2021 in [facility] Cardiac and Pulmonary Rehab  Referring Provider Aleskerov       Initial Encounter Date:  Flowsheet Row Pulmonary Rehab from 02/10/2021 in [facility] Cardiac and Pulmonary Rehab  Date 02/10/21       Visit Diagnosis: DOE (dyspnea on exertion)  Patient's Home Medications on Admission:  Current Outpatient Medications:    ALPRAZolam (XANAX) 0.5 MG tablet, Take 0.5 mg by mouth 2 (two) times daily as needed for anxiety or sleep., Disp: , Rfl:    Biotin 5 MG CAPS, Take 5 mg by mouth 2 (two) times daily., Disp: , Rfl:    Ca Carbonate-Mag Hydroxide (ROLAIDS PO), Take 2 each by mouth daily as needed (for acid reflux)., Disp: , Rfl:    Calcium Carb-Cholecalciferol (CALCIUM + VITAMIN D3 PO), Take 2 tablets by mouth 2 (two) times daily., Disp: , Rfl:    Cholecalciferol (VITAMIN D3) 1000 units CAPS, Take 1,000 Units by mouth daily., Disp: , Rfl:    colestipol (COLESTID) 1 g tablet, Take 2 tablets (2 g total) by mouth 2 (two) times daily., Disp: 180 tablet, Rfl: 3   ELIQUIS 5 MG TABS tablet, Take 5 mg by mouth 2 (two) times daily., Disp: , Rfl:    fluticasone (FLONASE) 50 MCG/ACT nasal spray, TAKE 2 SPRAYS INTO EACH NOSTRIL AT BEDTIME, Disp: , Rfl:    lisinopril (PRINIVIL,ZESTRIL) 10 MG tablet, Take 10 mg by mouth daily., Disp: , Rfl:    Multiple Vitamin (MULTI-VITAMIN DAILY PO), Take 1 tablet by mouth daily., Disp: , Rfl:    omeprazole (PRILOSEC) 40 MG capsule, Take 1 capsule (40 mg total) by mouth 2 (two) times daily before a meal., Disp: 180 capsule, Rfl: 3   sertraline (ZOLOFT) 100 MG tablet, Take 100 mg by mouth daily., Disp: , Rfl:    sodium fluoride (FLUORISHIELD) 1.1 % GEL dental gel, Place onto teeth., Disp: , Rfl:    SYNTHROID 125 MCG tablet, Take 125 mcg by mouth daily before breakfast. , Disp: , Rfl:     vitamin C (ASCORBIC ACID) 500 MG tablet, Take 1,000 mg by mouth daily. , Disp: , Rfl:    zinc gluconate 50 MG tablet, Take 50 mg by mouth daily., Disp: , Rfl:   Past Medical History: Past Medical History:  Diagnosis Date   Allergy    Anemia, iron deficiency    Anxiety    Atrial fibrillation ([place]) 01/2021   Benign essential tremor    Blood transfusion without reported diagnosis    Cancer ([place])    bacal cell nd squmous cell skin cancers   Cataract    Chronic kidney disease    Colon polyps    Depression    Diverticulosis    Dysphonia    Dysrhythmia    Fibrocystic Breast disease    pt denies   Gastric ulcer 2006   Gastrointestinal hemorrhage, hx of    GERD (gastroesophageal reflux disease)    History of COVID-19    History of hiatal hernia    Hx of basal cell carcinoma    and squamous cell skin cancer   Hyperlipidemia    Hypertension    Hypothyroidism    IBS (irritable bowel syndrome)    Internal hemorrhoids without mention of complication    LBBB (left bundle branch block)    Osteoarthritis    Overactive bladder  Stroke [facility]) 03/2018   Unspecified hypothyroidism    Vitamin D deficiency     Tobacco Use: Social History   Tobacco Use  Smoking Status Never  Smokeless Tobacco Never    Labs: Recent Review Flowsheet Data   There is no flowsheet data to display.      Pulmonary Assessment Scores:  Pulmonary Assessment Scores     Row Name 02/10/21 1628         ADL UCSD   SOB Score total 74     Rest 0     Walk 3     Stairs 5     Bath 3     Dress 3     Shop 3       CAT Score   CAT Score 21       mMRC Score   mMRC Score 1              UCSD: Self-administered rating of dyspnea associated with activities of daily living (ADLs) 6-point scale (0 = "not at all" to 5 = "maximal or unable to do because of breathlessness")  Scoring Scores range from 0 to 120.  Minimally important difference is 5 units  CAT: CAT can identify the health  impairment of COPD patients and is better correlated with disease progression.  CAT has a scoring range of zero to 40. The CAT score is classified into four groups of low (less than 10), medium (10 - 20), high (21-30) and very high (31-40) based on the impact level of disease on health status. A CAT score over 10 suggests significant symptoms.  A worsening CAT score could be explained by an exacerbation, poor medication adherence, poor inhaler technique, or progression of COPD or comorbid conditions.  CAT MCID is 2 points  mMRC: mMRC (Modified Medical Research Council) Dyspnea Scale is used to assess the degree of baseline functional disability in patients of respiratory disease due to dyspnea. No minimal important difference is established. A decrease in score of 1 point or greater is considered a positive change.   Pulmonary Function Assessment:   Exercise Target Goals: Exercise Program Goal: Individual exercise prescription set using results from initial 6 min walk test and THRR while considering  patients activity barriers and safety.   Exercise Prescription Goal: Initial exercise prescription builds to 30-45 minutes a day of aerobic activity, 2-3 days per week.  Home exercise guidelines will be given to patient during program as part of exercise prescription that the participant will acknowledge.  Education: Aerobic Exercise: - Group verbal and visual presentation on the components of exercise prescription. Introduces F.I.T.T principle from ACSM for exercise prescriptions.  Reviews F.I.T.T. principles of aerobic exercise including progression. Written material given at graduation.   Education: Resistance Exercise: - Group verbal and visual presentation on the components of exercise prescription. Introduces F.I.T.T principle from ACSM for exercise prescriptions  Reviews F.I.T.T. principles of resistance exercise including progression. Written material given at graduation.    Education:  Exercise & Equipment Safety: - Individual verbal instruction and demonstration of equipment use and safety with use of the equipment. Flowsheet Row Pulmonary Rehab from 03/03/2021 in [facility] Cardiac and Pulmonary Rehab  Date 02/10/21  Educator AS  Instruction Review Code 1- Verbalizes Understanding       Education: Exercise Physiology & General Exercise Guidelines: - Group verbal and written instruction with models to review the exercise physiology of the cardiovascular system and associated critical values. Provides general exercise guidelines with specific guidelines to those  with heart or lung disease.    Education: Flexibility, Balance, Mind/Body Relaxation: - Group verbal and visual presentation with interactive activity on the components of exercise prescription. Introduces F.I.T.T principle from ACSM for exercise prescriptions. Reviews F.I.T.T. principles of flexibility and balance exercise training including progression. Also discusses the mind body connection.  Reviews various relaxation techniques to help reduce and manage stress (i.e. Deep breathing, progressive muscle relaxation, and visualization). Balance handout provided to take home. Written material given at graduation.   Activity Barriers & Risk Stratification:  Activity Barriers & Cardiac Risk Stratification - 01/25/21 0941       Activity Barriers & Cardiac Risk Stratification   Activity Barriers Muscular Weakness;Shortness of Breath             6 Minute Walk:  6 Minute Walk     Row Name 02/10/21 1616         6 Minute Walk   Phase Initial     Distance 862 feet     Walk Time 6 minutes     # of Rest Breaks 0     MPH 1.63     METS 1.9     RPE 11     Perceived Dyspnea  2     VO2 Peak 6.6     Symptoms Yes (comment)     Comments SOB     Resting HR 98 bpm     Resting BP 122/70     Resting Oxygen Saturation  96 %     Exercise Oxygen Saturation  during 6 min walk 94 %     Max Ex. HR 112 bpm     Max Ex. BP  134/74     2 Minute Post BP 114/66       Interval HR   1 Minute HR 96     2 Minute HR 107     3 Minute HR 109     4 Minute HR 107     5 Minute HR 109     6 Minute HR 112     2 Minute Post HR 87     Interval Heart Rate? Yes       Interval Oxygen   Interval Oxygen? Yes     Baseline Oxygen Saturation % 96 %     1 Minute Oxygen Saturation % 94 %     1 Minute Liters of Oxygen 0 L     2 Minute Oxygen Saturation % 94 %     2 Minute Liters of Oxygen 0 L     3 Minute Oxygen Saturation % 95 %     3 Minute Liters of Oxygen 0 L     4 Minute Oxygen Saturation % 95 %     4 Minute Liters of Oxygen 0 L     5 Minute Oxygen Saturation % 94 %     5 Minute Liters of Oxygen 0 L     6 Minute Oxygen Saturation % 94 %     6 Minute Liters of Oxygen 0 L     2 Minute Post Oxygen Saturation % 96 %     2 Minute Post Liters of Oxygen 0 L             Oxygen Initial Assessment:  Oxygen Initial Assessment - 02/10/21 1711       Home Oxygen   Home Oxygen Device None    Sleep Oxygen Prescription None    Home Exercise Oxygen Prescription None  Home Resting Oxygen Prescription None    Compliance with Home Oxygen Use Yes      Initial 6 min Walk   Oxygen Used None      Program Oxygen Prescription   Program Oxygen Prescription None      Intervention   Short Term Goals To learn and demonstrate proper use of respiratory medications;To learn and demonstrate proper pursed lip breathing techniques or other breathing techniques. ;To learn and understand importance of monitoring SPO2 with pulse oximeter and demonstrate accurate use of the pulse oximeter.    Long  Term Goals Compliance with respiratory medication;Exhibits proper breathing techniques, such as pursed lip breathing or other method taught during program session;Demonstrates proper use of MDIs             Oxygen Re-Evaluation:  Oxygen Re-Evaluation     Row Name 02/15/21 0948             Program Oxygen Prescription   Program  Oxygen Prescription None         Home Oxygen   Home Oxygen Device None       Sleep Oxygen Prescription None       Home Exercise Oxygen Prescription None       Home Resting Oxygen Prescription None       Compliance with Home Oxygen Use Yes         Goals/Expected Outcomes   Short Term Goals To learn and demonstrate proper pursed lip breathing techniques or other breathing techniques. ;To learn and understand importance of monitoring SPO2 with pulse oximeter and demonstrate accurate use of the pulse oximeter.       Long  Term Goals Compliance with respiratory medication;Exhibits proper breathing techniques, such as pursed lip breathing or other method taught during program session       Comments Reviewed PLB technique with pt.  Talked about how it works and it's importance in maintaining their exercise saturations.       Goals/Expected Outcomes Short: Become more profiecient at using PLB.   Long: Become independent at using PLB.                Oxygen Discharge (Final Oxygen Re-Evaluation):  Oxygen Re-Evaluation - 02/15/21 0948       Program Oxygen Prescription   Program Oxygen Prescription None      Home Oxygen   Home Oxygen Device None    Sleep Oxygen Prescription None    Home Exercise Oxygen Prescription None    Home Resting Oxygen Prescription None    Compliance with Home Oxygen Use Yes      Goals/Expected Outcomes   Short Term Goals To learn and demonstrate proper pursed lip breathing techniques or other breathing techniques. ;To learn and understand importance of monitoring SPO2 with pulse oximeter and demonstrate accurate use of the pulse oximeter.    Long  Term Goals Compliance with respiratory medication;Exhibits proper breathing techniques, such as pursed lip breathing or other method taught during program session    Comments Reviewed PLB technique with pt.  Talked about how it works and it's importance in maintaining their exercise saturations.    Goals/Expected Outcomes  Short: Become more profiecient at using PLB.   Long: Become independent at using PLB.             Initial Exercise Prescription:  Initial Exercise Prescription - 02/10/21 1600       Date of Initial Exercise RX and Referring Provider   Date 02/10/21    Referring Provider  Aleskerov      Oxygen   Maintain Oxygen Saturation 88% or higher      Treadmill   MPH 1.3    Grade 0    Minutes 15    METs 2      NuStep   Level 1    SPM 80    Minutes 15    METs 1.9      REL-XR   Level 1    Speed 50    Minutes 15    METs 1.9      Track   Laps 16    Minutes 15    METs 1.9      Prescription Details   Frequency (times per week) 3    Duration Progress to 30 minutes of continuous aerobic without signs/symptoms of physical distress      Intensity   THRR 40-80% of Max Heartrate 116-134    Ratings of Perceived Exertion 11-15    Perceived Dyspnea 0-4      Resistance Training   Training Prescription Yes    Weight 3 lb    Reps 10-15             Perform Capillary Blood Glucose checks as needed.  Exercise Prescription Changes:   Exercise Prescription Changes     Row Name 02/10/21 1600 02/28/21 1000 03/15/21 0800         Response to Exercise   Blood Pressure (Admit) 122/70 112/62 126/64     Blood Pressure (Exercise) 134/74 134/68 124/70     Blood Pressure (Exit) 114/66 102/62 118/68     Heart Rate (Admit) 98 bpm 100 bpm 87 bpm     Heart Rate (Exercise) 112 bpm 118 bpm 101 bpm     Heart Rate (Exit) 87 bpm 109 bpm --     Oxygen Saturation (Admit) 96 % 97 % 96 %     Oxygen Saturation (Exercise) 94 % 94 % --     Oxygen Saturation (Exit) 96 % 96 % --     Rating of Perceived Exertion (Exercise) _0 Perceived Dyspnea (Exercise) 2 3 --     Symptoms SOB SOB --     Comments -- 3rd full day of exercise --     Duration -- Progress to 30 minutes of  aerobic without signs/symptoms of physical distress Progress to 30 minutes of  aerobic without signs/symptoms of  physical distress     Intensity -- THRR unchanged THRR unchanged       Progression   Progression -- Continue to progress workloads to maintain intensity without signs/symptoms of physical distress. Continue to progress workloads to maintain intensity without signs/symptoms of physical distress.     Average METs -- 1.94 2.2       Resistance Training   Training Prescription -- Yes Yes     Weight -- 3 lb 3 lb     Reps -- 10-15 10-15       Interval Training   Interval Training -- No No       Treadmill   MPH -- 1.3 1.5     Grade -- 0 --     Minutes -- 15 15     METs -- 2 2.15       NuStep   Level -- 1 1     Minutes -- 15 15     METs -- 2.5 2.2       REL-XR   Level -- 1 --  Minutes -- 15 --     METs -- 1 --       Oxygen   Maintain Oxygen Saturation -- 88% or higher 88% or higher              Exercise Comments:   Exercise Comments     Row Name 02/15/21 0946           Exercise Comments First full day of exercise!  Patient was oriented to gym and equipment including functions, settings, policies, and procedures.  Patient's individual exercise prescription and treatment plan were reviewed.  All starting workloads were established based on the results of the 6 minute walk test done at initial orientation visit.  The plan for exercise progression was also introduced and progression will be customized based on patient's performance and goals.                Exercise Goals and Review:   Exercise Goals     Row Name 02/10/21 1625             Exercise Goals   Increase Physical Activity Yes       Intervention Provide advice, education, support and counseling about physical activity/exercise needs.;Develop an individualized exercise prescription for aerobic and resistive training based on initial evaluation findings, risk stratification, comorbidities and participant's personal goals.       Expected Outcomes Short Term: Attend rehab on a regular basis to increase  amount of physical activity.;Long Term: Add in home exercise to make exercise part of routine and to increase amount of physical activity.;Long Term: Exercising regularly at least 3-5 days a week.       Increase Strength and Stamina Yes       Intervention Provide advice, education, support and counseling about physical activity/exercise needs.;Develop an individualized exercise prescription for aerobic and resistive training based on initial evaluation findings, risk stratification, comorbidities and participant's personal goals.       Expected Outcomes Short Term: Increase workloads from initial exercise prescription for resistance, speed, and METs.;Short Term: Perform resistance training exercises routinely during rehab and add in resistance training at home;Long Term: Improve cardiorespiratory fitness, muscular endurance and strength as measured by increased METs and functional capacity (6MWT)       Able to understand and use rate of perceived exertion (RPE) scale Yes       Intervention Provide education and explanation on how to use RPE scale       Expected Outcomes Short Term: Able to use RPE daily in rehab to express subjective intensity level;Long Term:  Able to use RPE to guide intensity level when exercising independently       Able to understand and use Dyspnea scale Yes       Intervention Provide education and explanation on how to use Dyspnea scale       Expected Outcomes Short Term: Able to use Dyspnea scale daily in rehab to express subjective sense of shortness of breath during exertion;Long Term: Able to use Dyspnea scale to guide intensity level when exercising independently       Knowledge and understanding of Target Heart Rate Range (THRR) Yes       Intervention Provide education and explanation of THRR including how the numbers were predicted and where they are located for reference       Expected Outcomes Short Term: Able to state/look up THRR;Short Term: Able to use daily as  guideline for intensity in rehab;Long Term: Able to use THRR to govern intensity  when exercising independently       Able to check pulse independently Yes       Intervention Provide education and demonstration on how to check pulse in carotid and radial arteries.;Review the importance of being able to check your own pulse for safety during independent exercise       Expected Outcomes Short Term: Able to explain why pulse checking is important during independent exercise;Long Term: Able to check pulse independently and accurately       Understanding of Exercise Prescription Yes       Intervention Provide education, explanation, and written materials on patient's individual exercise prescription       Expected Outcomes Short Term: Able to explain program exercise prescription;Long Term: Able to explain home exercise prescription to exercise independently                Exercise Goals Re-Evaluation :  Exercise Goals Re-Evaluation     Row Name 02/15/21 0946 02/28/21 1001 03/15/21 0824 03/30/21 0953       Exercise Goal Re-Evaluation   Exercise Goals Review Increase Physical Activity;Able to understand and use rate of perceived exertion (RPE) scale;Knowledge and understanding of Target Heart Rate Range (THRR);Understanding of Exercise Prescription;Increase Strength and Stamina;Able to understand and use Dyspnea scale;Able to check pulse independently Increase Physical Activity;Increase Strength and Stamina Increase Physical Activity;Increase Strength and Stamina --    Comments Reviewed RPE and dyspnea scales, THR and program prescription with pt today.  Pt voiced understanding and was given a copy of goals to take home. Brandy Golden is doing well the first couple of sessions she has been in rehab. She is currently tolerating her initial exercise prescription well and will continue to monitor for progression as she progresses through the program. 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  She has had  times where her legs "give out".  Staff will encourage trying to increase level on NS tohelp with leg strength. Out since last review with COVID and fall on knee    Expected Outcomes Short: Use RPE daily to regulate intensity. Long: Follow program prescription in THR. Short: Continue current exercise prescription Long: Increase overall MET level Short: increase NS Long:  improve leg strength --             Discharge Exercise Prescription (Final Exercise Prescription Changes):  Exercise Prescription Changes - 03/15/21 0800       Response to Exercise   Blood Pressure (Admit) 126/64    Blood Pressure (Exercise) 124/70    Blood Pressure (Exit) 118/68    Heart Rate (Admit) 87 bpm    Heart Rate (Exercise) 101 bpm    Oxygen Saturation (Admit) 96 %    Rating of Perceived Exertion (Exercise) 13    Duration Progress to 30 minutes of  aerobic without signs/symptoms of physical distress    Intensity THRR unchanged      Progression   Progression Continue to progress workloads to maintain intensity without signs/symptoms of physical distress.    Average METs 2.2      Resistance Training   Training Prescription Yes    Weight 3 lb    Reps 10-15      Interval Training   Interval Training No      Treadmill   MPH 1.5    Minutes 15    METs 2.15      NuStep   Level 1    Minutes 15    METs 2.2  Oxygen   Maintain Oxygen Saturation 88% or higher             Nutrition:  Target Goals: Understanding of nutrition guidelines, daily intake of sodium '1500mg'$ , cholesterol '200mg'$ , calories 30% from fat and 7% or less from saturated fats, daily to have 5 or more servings of fruits and vegetables.  Education: All About Nutrition: -Group instruction provided by verbal, written material, interactive activities, discussions, models, and posters to present general guidelines for heart healthy nutrition including fat, fiber, MyPlate, the role of sodium in heart healthy nutrition, utilization  of the nutrition label, and utilization of this knowledge for meal planning. Follow up email sent as well. Written material given at graduation. Flowsheet Row Pulmonary Rehab from 03/03/2021 in [facility] Cardiac and Pulmonary Rehab  Date 02/17/21  Educator [facility]  Instruction Review Code 1- Verbalizes Understanding       Biometrics:  Pre Biometrics - 02/10/21 1625       Pre Biometrics   Height 5' 10.5" (1.791 m)    Weight 200 lb 12.8 oz (91.1 kg)    BMI (Calculated) 28.39    Single Leg Stand 3.18 seconds              Nutrition Therapy Plan and Nutrition Goals:  Nutrition Therapy & Goals - 02/23/21 1003       Nutrition Therapy   Diet Heart healthy, low Na    Protein (specify units) 70g    Fiber 25 grams    Whole Grain Foods 3 servings    Saturated Fats 12 max. grams    Fruits and Vegetables 8 servings/day    Sodium 1.5 grams      Personal Nutrition Goals   Nutrition Goal ST: make sure getting whole grains in breakfast (whole grain tortilla, whole grain cereal), when making meals at home continue to focus on whole grains, healthy oils/fat, low salt, and variety LT: make sure meeting protien and calories needs during the day, make sure at least half of grains consumed are whole grains    Comments She is trying to cut out sugar and salt and she is eating many vegetables. She has reflux and she eats before 6pm. She has a lower appetite. B: cereal with nuts and a caramel cappichino or yogurt with nuts or banana with tortilla and peanut butter or oatmeal or grits or biscuit from [employer] S: fruit with cheese L/D 2-3pm: big meals. Goes out to eat or eats at home and leftovers the next day. Sandwiches, soups, pasta, salad (she tries to eat as much salad as possible as long as it works for her IBS - she is on a new medicaiton for that which is helping)  S: fruit  Drinks:  She went to nutrition education last week and has no questions regarding heart healthy eating. Discussed how eating and  cooking at home gives more control over salt, types of fat like healthy oils, variety, and less whole grains. She does not want to be on any kind of diet, but is willing to make small modifications. She does not want to limit going out to eat.      Intervention Plan   Intervention Prescribe, educate and counsel regarding individualized specific dietary modifications aiming towards targeted core components such as weight, hypertension, lipid management, diabetes, heart failure and other comorbidities.;Nutrition handout(s) given to patient.    Expected Outcomes Short Term Goal: Understand basic principles of dietary content, such as calories, fat, sodium, cholesterol and nutrients.;Short Term Goal: A plan  has been developed with personal nutrition goals set during dietitian appointment.;Long Term Goal: Adherence to prescribed nutrition plan.             Nutrition Assessments:  MEDIFICTS Score Key: ?70 Need to make dietary changes  40-70 Heart Healthy Diet ? 40 Therapeutic Level Cholesterol Diet  Flowsheet Row Pulmonary Rehab from 02/10/2021 in [facility] Cardiac and Pulmonary Rehab  Picture Your Plate Total Score on Admission 61      Picture Your Plate Scores: <93 Unhealthy dietary pattern with much room for improvement. 41-50 Dietary pattern unlikely to meet recommendations for good health and room for improvement. 51-60 More healthful dietary pattern, with some room for improvement.  >60 Healthy dietary pattern, although there may be some specific behaviors that could be improved.   Nutrition Goals Re-Evaluation:   Nutrition Goals Discharge (Final Nutrition Goals Re-Evaluation):   Psychosocial: Target Goals: Acknowledge presence or absence of significant depression and/or stress, maximize coping skills, provide positive support system. Participant is able to verbalize types and ability to use techniques and skills needed for reducing stress and depression.   Education: Stress,  Anxiety, and Depression - Group verbal and visual presentation to define topics covered.  Reviews how body is impacted by stress, anxiety, and depression.  Also discusses healthy ways to reduce stress and to treat/manage anxiety and depression.  Written material given at graduation.   Education: Sleep Hygiene -Provides group verbal and written instruction about how sleep can affect your health.  Define sleep hygiene, discuss sleep cycles and impact of sleep habits. Review good sleep hygiene tips.    Initial Review & Psychosocial Screening:  Initial Psych Review & Screening - 01/25/21 0949       Initial Review   Current issues with Current Psychotropic Meds;Current Stress Concerns    Source of Stress Concerns Unable to perform yard/household activities    Comments Stress unable to do ususal chores.  Takes more energy for ADLs      Family Dynamics   Good Support System? Yes   Family, friends church family   Comments Husband is up and doing, not back to normal.  Had multiple surgeries, infection in foot.      Barriers   Psychosocial barriers to participate in program There are no identifiable barriers or psychosocial needs.      Screening Interventions   Interventions Encouraged to exercise;To provide support and resources with identified psychosocial needs;Provide feedback about the scores to participant    Expected Outcomes Short Term goal: Utilizing psychosocial counselor, staff and physician to assist with identification of specific Stressors or current issues interfering with healing process. Setting desired goal for each stressor or current issue identified.;Long Term Goal: Stressors or current issues are controlled or eliminated.;Short Term goal: Identification and review with participant of any Quality of Life or Depression concerns found by scoring the questionnaire.;Long Term goal: The participant improves quality of Life and PHQ9 Scores as seen by post scores and/or verbalization of  changes             Quality of Life Scores:  Scores of 19 and below usually indicate a poorer quality of life in these areas.  A difference of  2-3 points is a clinically meaningful difference.  A difference of 2-3 points in the total score of the Quality of Life Index has been associated with significant improvement in overall quality of life, self-image, physical symptoms, and general health in studies assessing change in quality of life.  PHQ-9: Recent Review Flowsheet  Data     Depression screen [facility] 2/9 02/10/2021   Decreased Interest 1   Down, Depressed, Hopeless 0   PHQ - 2 Score 1   Altered sleeping 3    Tired, decreased energy 3   Change in appetite 2    Feeling bad or failure about yourself  3   Trouble concentrating 0   Moving slowly or fidgety/restless 2   Suicidal thoughts 0   PHQ-9 Score 14   Difficult doing work/chores Somewhat difficult      Interpretation of Total Score  Total Score Depression Severity:  1-4 = Minimal depression, 5-9 = Mild depression, 10-14 = Moderate depression, 15-19 = Moderately severe depression, 20-27 = Severe depression   Psychosocial Evaluation and Intervention:  Psychosocial Evaluation - 01/25/21 1008       Psychosocial Evaluation & Interventions   Interventions Encouraged to exercise with the program and follow exercise prescription    Comments Brandy Golden had delayed starting the program secondary to her husband requiring surgery and a recovery from infection. She is ready now and looking forward to joining the program. She lives with her husband and has family nearby. She wants to get back to her ability to exercise and is hopeful this will improve her ability to get back to some of her activities that have been hard to accomplish.  She had COVID early 2020.  She feels some of her SOB may be from that infection.  She should do well,as she is ready to get started.    Expected Outcomes STG: Brandy Golden is able to attend all scheduled sessions,  progress with her exercise. LTG Brandy Golden continues her exercise progression after discharge and has ability to complete some of the activies that she could not complete before attending the program    Continue Psychosocial Services  Follow up required by staff             Psychosocial Re-Evaluation:   Psychosocial Discharge (Final Psychosocial Re-Evaluation):   Education: Education Goals: Education classes will be provided on a weekly basis, covering required topics. Participant will state understanding/return demonstration of topics presented.  Learning Barriers/Preferences:  Learning Barriers/Preferences - 01/25/21 0958       Learning Barriers/Preferences   Learning Barriers None    Learning Preferences None             General Pulmonary Education Topics:  Infection Prevention: - Provides verbal and written material to individual with discussion of infection control including proper hand washing and proper equipment cleaning during exercise session. Flowsheet Row Pulmonary Rehab from 03/03/2021 in [facility] Cardiac and Pulmonary Rehab  Date 02/10/21  Educator AS  Instruction Review Code 1- Verbalizes Understanding       Falls Prevention: - Provides verbal and written material to individual with discussion of falls prevention and safety. Flowsheet Row Pulmonary Rehab from 03/03/2021 in [facility] Cardiac and Pulmonary Rehab  Date 02/10/21  Educator AS  Instruction Review Code 1- Verbalizes Understanding       Chronic Lung Disease Review: - Group verbal instruction with posters, models, PowerPoint presentations and videos,  to review new updates, new respiratory medications, new advancements in procedures and treatments. Providing information on websites and "800" numbers for continued self-education. Includes information about supplement oxygen, available portable oxygen systems, continuous and intermittent flow rates, oxygen safety, concentrators, and Medicare reimbursement  for oxygen. Explanation of Pulmonary Drugs, including class, frequency, complications, importance of spacers, rinsing mouth after steroid MDI's, and proper cleaning methods for nebulizers. Review of basic lung  anatomy and physiology related to function, structure, and complications of lung disease. Review of risk factors. Discussion about methods for diagnosing sleep apnea and types of masks and machines for OSA. Includes a review of the use of types of environmental controls: home humidity, furnaces, filters, dust mite/pet prevention, HEPA vacuums. Discussion about weather changes, air quality and the benefits of nasal washing. Instruction on Warning signs, infection symptoms, calling MD promptly, preventive modes, and value of vaccinations. Review of effective airway clearance, coughing and/or vibration techniques. Emphasizing that all should Create an Action Plan. Written material given at graduation. Flowsheet Row Pulmonary Rehab from 03/03/2021 in [facility] Cardiac and Pulmonary Rehab  Education need identified 02/10/21       AED/CPR: - Group verbal and written instruction with the use of models to demonstrate the basic use of the AED with the basic ABC's of resuscitation.    Anatomy and Cardiac Procedures: - Group verbal and visual presentation and models provide information about basic cardiac anatomy and function. Reviews the testing methods done to diagnose heart disease and the outcomes of the test results. Describes the treatment choices: Medical Management, Angioplasty, or Coronary Bypass Surgery for treating various heart conditions including Myocardial Infarction, Angina, Valve Disease, and Cardiac Arrhythmias.  Written material given at graduation.   Medication Safety: - Group verbal and visual instruction to review commonly prescribed medications for heart and lung disease. Reviews the medication, class of the drug, and side effects. Includes the steps to properly store meds and maintain  the prescription regimen.  Written material given at graduation.   Other: -Provides group and verbal instruction on various topics (see comments)   Knowledge Questionnaire Score:  Knowledge Questionnaire Score - 02/10/21 1628       Knowledge Questionnaire Score   Pre Score 15/18              Core Components/Risk Factors/Patient Goals at Admission:  Personal Goals and Risk Factors at Admission - 02/10/21 1630       Core Components/Risk Factors/Patient Goals on Admission    Weight Management Yes;Weight Loss    Intervention Weight Management: Develop a combined nutrition and exercise program designed to reach desired caloric intake, while maintaining appropriate intake of nutrient and fiber, sodium and fats, and appropriate energy expenditure required for the weight goal.;Weight Management: Provide education and appropriate resources to help participant work on and attain dietary goals.    Admit Weight 200 lb 12.8 oz (91.1 kg)    Goal Weight: Short Term 195 lb (88.5 kg)    Goal Weight: Long Term 190 lb (86.2 kg)    Expected Outcomes Short Term: Continue to assess and modify interventions until short term weight is achieved;Long Term: Adherence to nutrition and physical activity/exercise program aimed toward attainment of established weight goal;Weight Loss: Understanding of general recommendations for a balanced deficit meal plan, which promotes 1-2 lb weight loss per week and includes a negative energy balance of [id number] kcal/d    Improve shortness of breath with ADL's Yes    Intervention Provide education, individualized exercise plan and daily activity instruction to help decrease symptoms of SOB with activities of daily living.    Expected Outcomes Short Term: Improve cardiorespiratory fitness to achieve a reduction of symptoms when performing ADLs;Long Term: Be able to perform more ADLs without symptoms or delay the onset of symptoms    Increase knowledge of respiratory  medications and ability to use respiratory devices properly  Yes    Intervention Provide education and demonstration as  needed of appropriate use of medications, inhalers, and oxygen therapy.    Expected Outcomes Short Term: Achieves understanding of medications use. Understands that oxygen is a medication prescribed by physician. Demonstrates appropriate use of inhaler and oxygen therapy.;Long Term: Maintain appropriate use of medications, inhalers, and oxygen therapy.    Hypertension Yes    Intervention Provide education on lifestyle modifcations including regular physical activity/exercise, weight management, moderate sodium restriction and increased consumption of fresh fruit, vegetables, and low fat dairy, alcohol moderation, and smoking cessation.;Monitor prescription use compliance.    Expected Outcomes Short Term: Continued assessment and intervention until BP is < 140/53mm HG in hypertensive participants. < 130/50mm HG in hypertensive participants with diabetes, heart failure or chronic kidney disease.;Long Term: Maintenance of blood pressure at goal levels.             Education:Diabetes - Individual verbal and written instruction to review signs/symptoms of diabetes, desired ranges of glucose level fasting, after meals and with exercise. Acknowledge that pre and post exercise glucose checks will be done for 3 sessions at entry of program.   Know Your Numbers and Heart Failure: - Group verbal and visual instruction to discuss disease risk factors for cardiac and pulmonary disease and treatment options.  Reviews associated critical values for Overweight/Obesity, Hypertension, Cholesterol, and Diabetes.  Discusses basics of heart failure: signs/symptoms and treatments.  Introduces Heart Failure Zone chart for action plan for heart failure.  Written material given at graduation. Flowsheet Row Pulmonary Rehab from 03/03/2021 in [facility] Cardiac and Pulmonary Rehab  Date 03/03/21  Educator SB   Instruction Review Code 1- Verbalizes Understanding       Core Components/Risk Factors/Patient Goals Review:    Core Components/Risk Factors/Patient Goals at Discharge (Final Review):    ITP Comments:  ITP Comments     Row Name 01/25/21 1017 02/10/21 1639 02/15/21 0944 02/23/21 1025 03/02/21 0620   ITP Comments Virtual orientation call completed today. shehas an appointment on Date: 02/10/2021  for EP eval and gym Orientation.  Documentation of diagnosis can be found in [facility] Date: 12/30/2020 . Completed 6MWT and gym orientation. Initial ITP created and sent for review to Dr.Fuad Lanney Gins MD, Medical Director. First full day of exercise!  Patient was oriented to gym and equipment including functions, settings, policies, and procedures.  Patient's individual exercise prescription and treatment plan were reviewed.  All starting workloads were established based on the results of the 6 minute walk test done at initial orientation visit.  The plan for exercise progression was also introduced and progression will be customized based on patient's performance and goals. Completed initial RD consultation 30 Day review completed. Medical Director ITP review done, changes made as directed, and signed approval by Medical Director.    [place] Name 03/29/21 1352 03/30/21 0641 04/11/21 1155 04/25/21 1157 04/27/21 0811   ITP Comments Brandy Golden fell and is having knee issues.  She is walking with a walker - she broke her tail bone so she cant do seated machines. She sees her PCP and orthopedic Dr next week.  She will let us know next week if she can continue after she sees them. 30 Day review completed. Medical Director ITP review done, changes made as directed, and signed approval by Medical Director.   2 visits only Brandy Golden called to let us know that her balance is really off since her fall and that they want her to do PT for her leg.  She has her consult scheduled for 04/19/20.  We will put  her on medical hold but follow  up with her for how long she will be in PT. Spoke with [place] who stated she started PT 2 weeks ago. They said she may need to complete it for another 3 months but wasn't sure. She is going to call us next week to verify the length of time she will be over there. If it is 3 months, may have to discharge patient and get new referral when cleared to come back. 30 Day review completed. Medical Director ITP review done, changes made as directed, and signed approval by Medical Director.   NO visits this 30 days, out for medical reason    Row Name 05/02/21 1138           ITP Comments Patient states she is scheduled with PT until the end of March. Will discharge patient from Pulmonary Rehab at this time and advised her to ask her referring doctor for a new referral when she is cleared to come back after PT. Patient in agreement.                Comments: Discharge ITP

## 2021-05-05 DIAGNOSIS — M25561 Pain in right knee: Secondary | ICD-10-CM | POA: Diagnosis not present

## 2021-05-05 DIAGNOSIS — M25562 Pain in left knee: Secondary | ICD-10-CM | POA: Diagnosis not present

## 2021-05-09 ENCOUNTER — Other Ambulatory Visit: Payer: Self-pay | Admitting: Internal Medicine

## 2021-05-09 DIAGNOSIS — Z1231 Encounter for screening mammogram for malignant neoplasm of breast: Secondary | ICD-10-CM

## 2021-05-10 DIAGNOSIS — M25562 Pain in left knee: Secondary | ICD-10-CM | POA: Diagnosis not present

## 2021-05-10 DIAGNOSIS — M25561 Pain in right knee: Secondary | ICD-10-CM | POA: Diagnosis not present

## 2021-05-11 DIAGNOSIS — C44319 Basal cell carcinoma of skin of other parts of face: Secondary | ICD-10-CM | POA: Diagnosis not present

## 2021-05-17 DIAGNOSIS — M25561 Pain in right knee: Secondary | ICD-10-CM | POA: Diagnosis not present

## 2021-05-17 DIAGNOSIS — M25562 Pain in left knee: Secondary | ICD-10-CM | POA: Diagnosis not present

## 2021-05-19 DIAGNOSIS — M25562 Pain in left knee: Secondary | ICD-10-CM | POA: Diagnosis not present

## 2021-05-19 DIAGNOSIS — M25561 Pain in right knee: Secondary | ICD-10-CM | POA: Diagnosis not present

## 2021-05-24 DIAGNOSIS — M25561 Pain in right knee: Secondary | ICD-10-CM | POA: Diagnosis not present

## 2021-05-24 DIAGNOSIS — M25562 Pain in left knee: Secondary | ICD-10-CM | POA: Diagnosis not present

## 2021-05-25 DIAGNOSIS — C44519 Basal cell carcinoma of skin of other part of trunk: Secondary | ICD-10-CM | POA: Diagnosis not present

## 2021-05-26 DIAGNOSIS — M25561 Pain in right knee: Secondary | ICD-10-CM | POA: Diagnosis not present

## 2021-05-26 DIAGNOSIS — M25562 Pain in left knee: Secondary | ICD-10-CM | POA: Diagnosis not present

## 2021-05-31 DIAGNOSIS — M25561 Pain in right knee: Secondary | ICD-10-CM | POA: Diagnosis not present

## 2021-05-31 DIAGNOSIS — M25562 Pain in left knee: Secondary | ICD-10-CM | POA: Diagnosis not present

## 2021-06-07 DIAGNOSIS — M25562 Pain in left knee: Secondary | ICD-10-CM | POA: Diagnosis not present

## 2021-06-07 DIAGNOSIS — M25561 Pain in right knee: Secondary | ICD-10-CM | POA: Diagnosis not present

## 2021-06-07 DIAGNOSIS — I5032 Chronic diastolic (congestive) heart failure: Secondary | ICD-10-CM | POA: Diagnosis not present

## 2021-06-09 DIAGNOSIS — M25562 Pain in left knee: Secondary | ICD-10-CM | POA: Diagnosis not present

## 2021-06-09 DIAGNOSIS — M25561 Pain in right knee: Secondary | ICD-10-CM | POA: Diagnosis not present

## 2021-06-14 ENCOUNTER — Other Ambulatory Visit: Payer: Self-pay

## 2021-06-14 ENCOUNTER — Ambulatory Visit
Admission: RE | Admit: 2021-06-14 | Discharge: 2021-06-14 | Disposition: A | Discharge status: Home / Self Care | Payer: Medicare PPO | Source: Ambulatory Visit | Attending: Internal Medicine | Admitting: Internal Medicine

## 2021-06-14 DIAGNOSIS — M25562 Pain in left knee: Secondary | ICD-10-CM | POA: Diagnosis not present

## 2021-06-14 DIAGNOSIS — Z1231 Encounter for screening mammogram for malignant neoplasm of breast: Secondary | ICD-10-CM | POA: Insufficient documentation

## 2021-06-14 DIAGNOSIS — M25561 Pain in right knee: Secondary | ICD-10-CM | POA: Diagnosis not present

## 2021-06-15 DIAGNOSIS — M170 Bilateral primary osteoarthritis of knee: Secondary | ICD-10-CM | POA: Diagnosis not present

## 2021-06-22 DIAGNOSIS — Z0100 Encounter for examination of eyes and vision without abnormal findings: Secondary | ICD-10-CM | POA: Diagnosis not present

## 2021-06-22 DIAGNOSIS — M3501 Sicca syndrome with keratoconjunctivitis: Secondary | ICD-10-CM | POA: Diagnosis not present

## 2021-06-22 DIAGNOSIS — M25562 Pain in left knee: Secondary | ICD-10-CM | POA: Diagnosis not present

## 2021-06-22 DIAGNOSIS — M25561 Pain in right knee: Secondary | ICD-10-CM | POA: Diagnosis not present

## 2021-06-30 DIAGNOSIS — R062 Wheezing: Secondary | ICD-10-CM | POA: Diagnosis not present

## 2021-07-01 DIAGNOSIS — M25561 Pain in right knee: Secondary | ICD-10-CM | POA: Diagnosis not present

## 2021-07-01 DIAGNOSIS — M25562 Pain in left knee: Secondary | ICD-10-CM | POA: Diagnosis not present

## 2021-07-05 DIAGNOSIS — M25562 Pain in left knee: Secondary | ICD-10-CM | POA: Diagnosis not present

## 2021-07-05 DIAGNOSIS — M25561 Pain in right knee: Secondary | ICD-10-CM | POA: Diagnosis not present

## 2021-07-26 DIAGNOSIS — M25561 Pain in right knee: Secondary | ICD-10-CM | POA: Diagnosis not present

## 2021-07-26 DIAGNOSIS — M25562 Pain in left knee: Secondary | ICD-10-CM | POA: Diagnosis not present

## 2021-08-23 DIAGNOSIS — R002 Palpitations: Secondary | ICD-10-CM | POA: Diagnosis not present

## 2021-08-23 DIAGNOSIS — R0602 Shortness of breath: Secondary | ICD-10-CM | POA: Diagnosis not present

## 2021-08-23 DIAGNOSIS — I4510 Unspecified right bundle-branch block: Secondary | ICD-10-CM | POA: Diagnosis not present

## 2021-08-23 DIAGNOSIS — I10 Essential (primary) hypertension: Secondary | ICD-10-CM | POA: Diagnosis not present

## 2021-08-23 DIAGNOSIS — I5032 Chronic diastolic (congestive) heart failure: Secondary | ICD-10-CM | POA: Diagnosis not present

## 2021-08-23 DIAGNOSIS — Z950 Presence of cardiac pacemaker: Secondary | ICD-10-CM | POA: Diagnosis not present

## 2021-08-23 DIAGNOSIS — R0609 Other forms of dyspnea: Secondary | ICD-10-CM | POA: Diagnosis not present

## 2021-09-19 DIAGNOSIS — E039 Hypothyroidism, unspecified: Secondary | ICD-10-CM | POA: Diagnosis not present

## 2021-09-19 DIAGNOSIS — E785 Hyperlipidemia, unspecified: Secondary | ICD-10-CM | POA: Diagnosis not present

## 2021-09-19 DIAGNOSIS — I10 Essential (primary) hypertension: Secondary | ICD-10-CM | POA: Diagnosis not present

## 2021-09-19 DIAGNOSIS — R739 Hyperglycemia, unspecified: Secondary | ICD-10-CM | POA: Diagnosis not present

## 2021-09-19 DIAGNOSIS — E559 Vitamin D deficiency, unspecified: Secondary | ICD-10-CM | POA: Diagnosis not present

## 2021-09-26 DIAGNOSIS — M8589 Other specified disorders of bone density and structure, multiple sites: Secondary | ICD-10-CM | POA: Diagnosis not present

## 2021-09-26 DIAGNOSIS — Z1389 Encounter for screening for other disorder: Secondary | ICD-10-CM | POA: Diagnosis not present

## 2021-09-26 DIAGNOSIS — R82998 Other abnormal findings in urine: Secondary | ICD-10-CM | POA: Diagnosis not present

## 2021-09-26 DIAGNOSIS — Z950 Presence of cardiac pacemaker: Secondary | ICD-10-CM | POA: Diagnosis not present

## 2021-09-26 DIAGNOSIS — F411 Generalized anxiety disorder: Secondary | ICD-10-CM | POA: Diagnosis not present

## 2021-09-26 DIAGNOSIS — M8580 Other specified disorders of bone density and structure, unspecified site: Secondary | ICD-10-CM | POA: Diagnosis not present

## 2021-09-26 DIAGNOSIS — G629 Polyneuropathy, unspecified: Secondary | ICD-10-CM | POA: Diagnosis not present

## 2021-09-26 DIAGNOSIS — I10 Essential (primary) hypertension: Secondary | ICD-10-CM | POA: Diagnosis not present

## 2021-09-26 DIAGNOSIS — Z1331 Encounter for screening for depression: Secondary | ICD-10-CM | POA: Diagnosis not present

## 2021-09-26 DIAGNOSIS — Z0000 Encounter for general adult medical examination without abnormal findings: Secondary | ICD-10-CM | POA: Diagnosis not present

## 2021-09-26 DIAGNOSIS — D692 Other nonthrombocytopenic purpura: Secondary | ICD-10-CM | POA: Diagnosis not present

## 2021-09-26 DIAGNOSIS — D6869 Other thrombophilia: Secondary | ICD-10-CM | POA: Diagnosis not present

## 2021-09-26 DIAGNOSIS — Z8673 Personal history of transient ischemic attack (TIA), and cerebral infarction without residual deficits: Secondary | ICD-10-CM | POA: Diagnosis not present

## 2021-09-29 DIAGNOSIS — M9905 Segmental and somatic dysfunction of pelvic region: Secondary | ICD-10-CM | POA: Diagnosis not present

## 2021-09-29 DIAGNOSIS — S335XXA Sprain of ligaments of lumbar spine, initial encounter: Secondary | ICD-10-CM | POA: Diagnosis not present

## 2021-09-29 DIAGNOSIS — M9904 Segmental and somatic dysfunction of sacral region: Secondary | ICD-10-CM | POA: Diagnosis not present

## 2021-09-29 DIAGNOSIS — M9902 Segmental and somatic dysfunction of thoracic region: Secondary | ICD-10-CM | POA: Diagnosis not present

## 2021-09-29 DIAGNOSIS — M9903 Segmental and somatic dysfunction of lumbar region: Secondary | ICD-10-CM | POA: Diagnosis not present

## 2021-10-05 DIAGNOSIS — L538 Other specified erythematous conditions: Secondary | ICD-10-CM | POA: Diagnosis not present

## 2021-10-05 DIAGNOSIS — D2262 Melanocytic nevi of left upper limb, including shoulder: Secondary | ICD-10-CM | POA: Diagnosis not present

## 2021-10-05 DIAGNOSIS — L570 Actinic keratosis: Secondary | ICD-10-CM | POA: Diagnosis not present

## 2021-10-05 DIAGNOSIS — L298 Other pruritus: Secondary | ICD-10-CM | POA: Diagnosis not present

## 2021-10-05 DIAGNOSIS — D2261 Melanocytic nevi of right upper limb, including shoulder: Secondary | ICD-10-CM | POA: Diagnosis not present

## 2021-10-05 DIAGNOSIS — D225 Melanocytic nevi of trunk: Secondary | ICD-10-CM | POA: Diagnosis not present

## 2021-10-05 DIAGNOSIS — L820 Inflamed seborrheic keratosis: Secondary | ICD-10-CM | POA: Diagnosis not present

## 2021-10-05 DIAGNOSIS — Z85828 Personal history of other malignant neoplasm of skin: Secondary | ICD-10-CM | POA: Diagnosis not present

## 2021-10-10 DIAGNOSIS — S335XXA Sprain of ligaments of lumbar spine, initial encounter: Secondary | ICD-10-CM | POA: Diagnosis not present

## 2021-10-10 DIAGNOSIS — M9902 Segmental and somatic dysfunction of thoracic region: Secondary | ICD-10-CM | POA: Diagnosis not present

## 2021-10-10 DIAGNOSIS — M9905 Segmental and somatic dysfunction of pelvic region: Secondary | ICD-10-CM | POA: Diagnosis not present

## 2021-10-10 DIAGNOSIS — M9903 Segmental and somatic dysfunction of lumbar region: Secondary | ICD-10-CM | POA: Diagnosis not present

## 2021-10-10 DIAGNOSIS — M9904 Segmental and somatic dysfunction of sacral region: Secondary | ICD-10-CM | POA: Diagnosis not present

## 2021-11-01 DIAGNOSIS — I509 Heart failure, unspecified: Secondary | ICD-10-CM | POA: Diagnosis not present

## 2021-11-01 DIAGNOSIS — F330 Major depressive disorder, recurrent, mild: Secondary | ICD-10-CM | POA: Diagnosis not present

## 2021-11-01 DIAGNOSIS — E039 Hypothyroidism, unspecified: Secondary | ICD-10-CM | POA: Diagnosis not present

## 2021-11-01 DIAGNOSIS — G629 Polyneuropathy, unspecified: Secondary | ICD-10-CM | POA: Diagnosis not present

## 2021-11-01 DIAGNOSIS — I110 Hypertensive heart disease with heart failure: Secondary | ICD-10-CM | POA: Diagnosis not present

## 2021-11-01 DIAGNOSIS — E261 Secondary hyperaldosteronism: Secondary | ICD-10-CM | POA: Diagnosis not present

## 2021-11-01 DIAGNOSIS — F411 Generalized anxiety disorder: Secondary | ICD-10-CM | POA: Diagnosis not present

## 2021-11-01 DIAGNOSIS — I4891 Unspecified atrial fibrillation: Secondary | ICD-10-CM | POA: Diagnosis not present

## 2021-11-01 DIAGNOSIS — D6869 Other thrombophilia: Secondary | ICD-10-CM | POA: Diagnosis not present

## 2021-12-13 DIAGNOSIS — I442 Atrioventricular block, complete: Secondary | ICD-10-CM | POA: Diagnosis not present

## 2021-12-29 DIAGNOSIS — E7800 Pure hypercholesterolemia, unspecified: Secondary | ICD-10-CM | POA: Diagnosis not present

## 2021-12-29 DIAGNOSIS — R0609 Other forms of dyspnea: Secondary | ICD-10-CM | POA: Diagnosis not present

## 2021-12-29 DIAGNOSIS — I10 Essential (primary) hypertension: Secondary | ICD-10-CM | POA: Diagnosis not present

## 2021-12-29 DIAGNOSIS — I5032 Chronic diastolic (congestive) heart failure: Secondary | ICD-10-CM | POA: Diagnosis not present

## 2021-12-29 DIAGNOSIS — Z23 Encounter for immunization: Secondary | ICD-10-CM | POA: Diagnosis not present

## 2021-12-29 DIAGNOSIS — Z950 Presence of cardiac pacemaker: Secondary | ICD-10-CM | POA: Diagnosis not present

## 2021-12-29 DIAGNOSIS — R002 Palpitations: Secondary | ICD-10-CM | POA: Diagnosis not present

## 2022-04-07 DIAGNOSIS — I129 Hypertensive chronic kidney disease with stage 1 through stage 4 chronic kidney disease, or unspecified chronic kidney disease: Secondary | ICD-10-CM | POA: Diagnosis not present

## 2022-04-07 DIAGNOSIS — Z7901 Long term (current) use of anticoagulants: Secondary | ICD-10-CM | POA: Diagnosis not present

## 2022-04-07 DIAGNOSIS — I4891 Unspecified atrial fibrillation: Secondary | ICD-10-CM | POA: Diagnosis not present

## 2022-04-07 DIAGNOSIS — I5032 Chronic diastolic (congestive) heart failure: Secondary | ICD-10-CM | POA: Diagnosis not present

## 2022-04-07 DIAGNOSIS — E785 Hyperlipidemia, unspecified: Secondary | ICD-10-CM | POA: Diagnosis not present

## 2022-04-07 DIAGNOSIS — N1831 Chronic kidney disease, stage 3a: Secondary | ICD-10-CM | POA: Diagnosis not present

## 2022-04-07 DIAGNOSIS — D6869 Other thrombophilia: Secondary | ICD-10-CM | POA: Diagnosis not present

## 2022-04-07 DIAGNOSIS — K449 Diaphragmatic hernia without obstruction or gangrene: Secondary | ICD-10-CM | POA: Diagnosis not present

## 2022-04-07 DIAGNOSIS — E039 Hypothyroidism, unspecified: Secondary | ICD-10-CM | POA: Diagnosis not present

## 2022-04-10 DIAGNOSIS — E039 Hypothyroidism, unspecified: Secondary | ICD-10-CM | POA: Diagnosis not present

## 2022-04-19 DIAGNOSIS — Z85828 Personal history of other malignant neoplasm of skin: Secondary | ICD-10-CM | POA: Diagnosis not present

## 2022-04-19 DIAGNOSIS — L538 Other specified erythematous conditions: Secondary | ICD-10-CM | POA: Diagnosis not present

## 2022-04-19 DIAGNOSIS — D2262 Melanocytic nevi of left upper limb, including shoulder: Secondary | ICD-10-CM | POA: Diagnosis not present

## 2022-04-19 DIAGNOSIS — D225 Melanocytic nevi of trunk: Secondary | ICD-10-CM | POA: Diagnosis not present

## 2022-04-19 DIAGNOSIS — X32XXXA Exposure to sunlight, initial encounter: Secondary | ICD-10-CM | POA: Diagnosis not present

## 2022-04-19 DIAGNOSIS — L820 Inflamed seborrheic keratosis: Secondary | ICD-10-CM | POA: Diagnosis not present

## 2022-04-19 DIAGNOSIS — D2261 Melanocytic nevi of right upper limb, including shoulder: Secondary | ICD-10-CM | POA: Diagnosis not present

## 2022-04-19 DIAGNOSIS — L570 Actinic keratosis: Secondary | ICD-10-CM | POA: Diagnosis not present

## 2022-04-19 DIAGNOSIS — C44712 Basal cell carcinoma of skin of right lower limb, including hip: Secondary | ICD-10-CM | POA: Diagnosis not present

## 2022-04-19 DIAGNOSIS — D0461 Carcinoma in situ of skin of right upper limb, including shoulder: Secondary | ICD-10-CM | POA: Diagnosis not present

## 2022-04-19 DIAGNOSIS — D485 Neoplasm of uncertain behavior of skin: Secondary | ICD-10-CM | POA: Diagnosis not present

## 2022-05-01 DIAGNOSIS — N1831 Chronic kidney disease, stage 3a: Secondary | ICD-10-CM | POA: Diagnosis not present

## 2022-05-01 DIAGNOSIS — I480 Paroxysmal atrial fibrillation: Secondary | ICD-10-CM | POA: Diagnosis not present

## 2022-05-01 DIAGNOSIS — R0602 Shortness of breath: Secondary | ICD-10-CM | POA: Diagnosis not present

## 2022-05-01 DIAGNOSIS — Z23 Encounter for immunization: Secondary | ICD-10-CM | POA: Diagnosis not present

## 2022-05-01 DIAGNOSIS — R002 Palpitations: Secondary | ICD-10-CM | POA: Diagnosis not present

## 2022-05-01 DIAGNOSIS — E7800 Pure hypercholesterolemia, unspecified: Secondary | ICD-10-CM | POA: Diagnosis not present

## 2022-05-01 DIAGNOSIS — I10 Essential (primary) hypertension: Secondary | ICD-10-CM | POA: Diagnosis not present

## 2022-05-01 DIAGNOSIS — I5032 Chronic diastolic (congestive) heart failure: Secondary | ICD-10-CM | POA: Diagnosis not present

## 2022-05-01 DIAGNOSIS — Z950 Presence of cardiac pacemaker: Secondary | ICD-10-CM | POA: Diagnosis not present

## 2022-05-10 DIAGNOSIS — C44712 Basal cell carcinoma of skin of right lower limb, including hip: Secondary | ICD-10-CM | POA: Diagnosis not present

## 2022-05-16 ENCOUNTER — Other Ambulatory Visit: Payer: Self-pay | Admitting: Internal Medicine

## 2022-05-16 DIAGNOSIS — N390 Urinary tract infection, site not specified: Secondary | ICD-10-CM | POA: Diagnosis not present

## 2022-05-16 DIAGNOSIS — R58 Hemorrhage, not elsewhere classified: Secondary | ICD-10-CM

## 2022-05-16 DIAGNOSIS — N939 Abnormal uterine and vaginal bleeding, unspecified: Secondary | ICD-10-CM | POA: Diagnosis not present

## 2022-05-16 DIAGNOSIS — K649 Unspecified hemorrhoids: Secondary | ICD-10-CM | POA: Diagnosis not present

## 2022-05-17 ENCOUNTER — Other Ambulatory Visit (HOSPITAL_BASED_OUTPATIENT_CLINIC_OR_DEPARTMENT_OTHER): Payer: Self-pay | Admitting: Internal Medicine

## 2022-05-17 ENCOUNTER — Other Ambulatory Visit (HOSPITAL_COMMUNITY): Payer: Self-pay | Admitting: Internal Medicine

## 2022-05-17 ENCOUNTER — Ambulatory Visit
Admission: RE | Admit: 2022-05-17 | Discharge: 2022-05-17 | Disposition: A | Discharge status: Home / Self Care | Payer: Medicare PPO | Source: Ambulatory Visit | Attending: Internal Medicine | Admitting: Internal Medicine

## 2022-05-17 DIAGNOSIS — N281 Cyst of kidney, acquired: Secondary | ICD-10-CM | POA: Diagnosis not present

## 2022-05-17 DIAGNOSIS — K8020 Calculus of gallbladder without cholecystitis without obstruction: Secondary | ICD-10-CM | POA: Diagnosis not present

## 2022-05-17 DIAGNOSIS — R58 Hemorrhage, not elsewhere classified: Secondary | ICD-10-CM

## 2022-05-17 DIAGNOSIS — K449 Diaphragmatic hernia without obstruction or gangrene: Secondary | ICD-10-CM | POA: Diagnosis not present

## 2022-05-17 DIAGNOSIS — R319 Hematuria, unspecified: Secondary | ICD-10-CM | POA: Diagnosis not present

## 2022-05-18 ENCOUNTER — Other Ambulatory Visit: Payer: Self-pay

## 2022-05-18 ENCOUNTER — Telehealth: Payer: Self-pay | Admitting: Internal Medicine

## 2022-05-18 DIAGNOSIS — R195 Other fecal abnormalities: Secondary | ICD-10-CM

## 2022-05-18 NOTE — Telephone Encounter (Signed)
Note from Dr. Virgina Jock regarding patient  It sounds like she has had episodes of brisk rectal bleeding.  She does have a history of hemorrhoids but also diverticulosis.  This is very likely nothing serious though we need to be sure she is not having serious bleeding  Would have her come for an APP visit and we need to be sure hemoglobin has been checked in the last 1 to 2 weeks

## 2022-05-18 NOTE — Telephone Encounter (Signed)
Pt scheduled to see Vicie Mutters PA tomorrow at 1:30pm for rectal bleeding. Pt to have CBC at lab prior to appt. Order in epic.

## 2022-05-19 ENCOUNTER — Other Ambulatory Visit (INDEPENDENT_AMBULATORY_CARE_PROVIDER_SITE_OTHER): Payer: Medicare PPO

## 2022-05-19 ENCOUNTER — Ambulatory Visit (INDEPENDENT_AMBULATORY_CARE_PROVIDER_SITE_OTHER)
Admission: RE | Admit: 2022-05-19 | Discharge: 2022-05-19 | Disposition: A | Discharge status: Home / Self Care | Payer: Medicare PPO | Source: Ambulatory Visit | Attending: Physician Assistant | Admitting: Physician Assistant

## 2022-05-19 ENCOUNTER — Ambulatory Visit: Payer: Medicare PPO | Admitting: Physician Assistant

## 2022-05-19 ENCOUNTER — Encounter: Payer: Self-pay | Admitting: Physician Assistant

## 2022-05-19 VITALS — BP 110/70 | HR 62 | Ht 70.5 in | Wt 200.5 lb

## 2022-05-19 DIAGNOSIS — K648 Other hemorrhoids: Secondary | ICD-10-CM

## 2022-05-19 DIAGNOSIS — K449 Diaphragmatic hernia without obstruction or gangrene: Secondary | ICD-10-CM | POA: Diagnosis not present

## 2022-05-19 DIAGNOSIS — R195 Other fecal abnormalities: Secondary | ICD-10-CM

## 2022-05-19 DIAGNOSIS — M419 Scoliosis, unspecified: Secondary | ICD-10-CM | POA: Diagnosis not present

## 2022-05-19 DIAGNOSIS — K219 Gastro-esophageal reflux disease without esophagitis: Secondary | ICD-10-CM

## 2022-05-19 DIAGNOSIS — K5900 Constipation, unspecified: Secondary | ICD-10-CM | POA: Diagnosis not present

## 2022-05-19 LAB — CBC WITH DIFFERENTIAL/PLATELET
Basophils Absolute: 0 10*3/uL (ref 0.0–0.1)
Basophils Relative: 0.3 % (ref 0.0–3.0)
Eosinophils Absolute: 0.1 10*3/uL (ref 0.0–0.7)
Eosinophils Relative: 1.1 % (ref 0.0–5.0)
HCT: 38.4 % (ref 36.0–46.0)
Hemoglobin: 12.8 g/dL (ref 12.0–15.0)
Lymphocytes Relative: 21.8 % (ref 12.0–46.0)
Lymphs Abs: 1.5 10*3/uL (ref 0.7–4.0)
MCHC: 33.3 g/dL (ref 30.0–36.0)
MCV: 89 fl (ref 78.0–100.0)
Monocytes Absolute: 0.6 10*3/uL (ref 0.1–1.0)
Monocytes Relative: 8.1 % (ref 3.0–12.0)
Neutro Abs: 4.7 10*3/uL (ref 1.4–7.7)
Neutrophils Relative %: 68.7 % (ref 43.0–77.0)
Platelets: 159 10*3/uL (ref 150.0–400.0)
RBC: 4.31 Mil/uL (ref 3.87–5.11)
RDW: 14.5 % (ref 11.5–15.5)
WBC: 6.9 10*3/uL (ref 4.0–10.5)

## 2022-05-19 MED ORDER — HYDROCORTISONE ACETATE 25 MG RE SUPP: 25.0000 mg | Freq: Two times a day (BID) | RECTAL | 0 refills | Status: DC

## 2022-05-19 MED ORDER — SUCRALFATE 1 G PO TABS: 1.0000 g | ORAL_TABLET | Freq: Three times a day (TID) | ORAL | 0 refills | Status: DC

## 2022-05-19 MED ORDER — HYDROCORTISONE (PERIANAL) 2.5 % EX CREA: 1.0000 | TOPICAL_CREAM | Freq: Two times a day (BID) | CUTANEOUS | 2 refills | Status: DC

## 2022-05-19 NOTE — Progress Notes (Signed)
05/19/2022 Brandy Golden [id number] Dec 11, 1945  Referring provider: Shon Baton, MD Primary GI doctor: Dr. Hilarie Fredrickson  ASSESSMENT AND PLAN:   Loose stools but with exam of large hard stool, possible overflow incontinence with pelvic floor dysfunction Normal colon 2020 No associated anemia -Will get Xray to evaluate stool burden -May need miralx purge or 1/2 golytely -Offered pelvic floor PT, declines at this time  Vaginal versus rectal bleeding, Heme positive stool Colon 2020 tics, hemorrhoids CT renal 05/2022 unremarkable Negative hemoccult in the office Large internal hemorrhoids known and appreciated Possible vaginal prolapse - hydrocortisone suppository/cream - albolene for vaginal dryness - consider imaging with contrast/pelvic US - consider hemorrhoid banding  GERD with large hiatal hernia, failed nissan fundoplication, SOB No melena, no anemia On PPI BID -Will add on carafate to maximize GERD control with SOB, burping, GERD symptoms -Consider surgical referral [facility] if not improving -Can consider repeat EGD/UGI pending results/goal of care  Scheduled next avilable with Dr. Hilarie Fredrickson 08/02/22 but may see back sooner on my schedule.   Patient Care Team: Shon Baton, MD as PCP - General (Internal Medicine)  HISTORY OF PRESENT ILLNESS: 77 y.o. 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   05/18/2016 EGD Lumen of the esophagus was mildly dilated.   Lower third was mildly tortuous.  Large hiatal hernia present.  Food residue in the gastric fundus within the hiatal hernia.  Evidence of fundoplication in the gastric body with a loose appearing.  Flattening was found in the duodenal bulb and second portion of the duodenum which was biopsied. Ambulatory pH and impedance testing --control of distal esophageal acid was excellent.  Abnormal absolute number of reflux events.  Positive symptom correlation for regurgitation.  Limited study as the probe did not reach the stomach felt secondary to the large hiatal hernia Esophageal manometry -hypotensive UES and LES.  Manometry indicating moderate ineffective esophageal motility. 02/05/2019 colonoscopy for screening purposes good prep diverticulosis sigmoid, descending, ascending colon, internal hemorrhoids 01/20/2021 office visit with Dr. Hilarie Fredrickson for heme positive stools and diarrhea. At that visit iron 122, ferritin 96, saturation 31, TIBC 277, B12 474, Hgb 13, platelets 173, WBC 7.2.  Patient did have previous thrombocytopenia  Reviewed:  10/07/2021 Hemoccult positive x 3 05/16/2022 labs with PCP: urine with moderate bacteria negative RBC, negative WBC.   WBC 7.08 Hgb 12.9, MCV 85.6, platelets 166 BUN 24 compared with 25, normal liver function thyroid 0.48 CEA negative 05/17/2022 CT renal stone study for hematuria and dysuria showed no acute findings in abdomen or pelvis no renal stone.  Large hiatal hernia containing much of the stomach increased in size from 2015, cholelithiasis  Diarrhea responded well to colestipol 2 g daily. Patient states she was told previously had vaginal prolapse.  She has urinary incontinence, wears depends.  She has IBS and when this acts up she has to carry depends.  Will have fecal incontinence with urgency, large volume, soft mushy stools occ liquids Worse with anxiety.  She states she feels she has had vaginal bleeding, states she will have  bleeding into the toilet, will pat  and feel it is coming vaginally. She is s/p total hysterectomy.  She has large prolapsing internal hemorrhoid, denies rectal pain. No burning or itching anywhere. She is seeing pulmonary/cardiology, has had decreased energy and SOB worse with exertion, questioning if her hernia could contribute to this, has some hoarseness and difficulty breathing in the room with me, getting evaluated.  Reflux is controlled with omeprazole, has intermittent mid esophagus food get stock, can sit up/rub/walk and feel better. On it twice a day 40 mg daily.  No melena.    She  reports that she has never smoked. She has never used smokeless tobacco. She reports current alcohol use. She reports that she does not use drugs.  RELEVANT LABS AND IMAGING: CBC    Component Value Date/Time   WBC 6.9 05/19/2022 1304   RBC 4.31 05/19/2022 1304   HGB 12.8 05/19/2022 1304   HCT 38.4 05/19/2022 1304   PLT 159.0 05/19/2022 1304   MCV 89.0 05/19/2022 1304   MCH 29.7 03/08/2019 0601   MCHC 33.3 05/19/2022 1304   RDW 14.5 05/19/2022 1304   LYMPHSABS 1.5 05/19/2022 1304   MONOABS 0.6 05/19/2022 1304   EOSABS 0.1 05/19/2022 1304   BASOSABS 0.0 05/19/2022 1304     CMP     Component Value Date/Time   NA 140 03/08/2019 0601   K 4.6 03/08/2019 0601   CL 105 03/08/2019 0601   CO2 27 03/08/2019 0601   GLUCOSE 135 (H) 03/08/2019 0601   BUN 14 03/08/2019 0601   CREATININE 0.92 03/08/2019 0601   CALCIUM 9.2 03/08/2019 0601   PROT 6.9 03/04/2019 1411   ALBUMIN 3.9 03/04/2019 1411   AST 22 03/04/2019 1411   ALT 19 03/04/2019 1411   ALKPHOS 67 03/04/2019 1411   BILITOT 0.9 03/04/2019 1411   GFRNONAA >60 03/08/2019 0601   GFRAA >60 03/08/2019 0601      Latest Ref Rng & Units 03/04/2019    2:11 PM 10/01/2009    9:00 AM  Hepatic Function  Total Protein 6.5 - 8.1 g/dL 6.9  7.3   Albumin 3.5 - 5.0 g/dL 3.9  4.0   AST 15 - 41 U/L 22  27   ALT 0 - 44 U/L 19  22   Alk Phosphatase 38 - 126 U/L 67  74   Total Bilirubin 0.3  - 1.2 mg/dL 0.9  0.5       Current Medications:   Current Outpatient Medications (Endocrine & Metabolic):    SYNTHROID 0000000 MCG tablet, Take 125 mcg by mouth daily before breakfast.   Current Outpatient Medications (Cardiovascular):    colestipol (COLESTID) 1 g tablet, Take 2 tablets (2 g total) by mouth 2 (two) times daily.   lisinopril (PRINIVIL,ZESTRIL) 10 MG tablet, Take 10 mg by mouth daily.  Current Outpatient Medications (Respiratory):    fluticasone (FLONASE) 50 MCG/ACT nasal spray, TAKE 2 SPRAYS INTO EACH NOSTRIL AT BEDTIME   Current Outpatient Medications (Hematological):    ELIQUIS 5 MG TABS tablet, Take 5 mg by mouth 2 (two) times daily.  Current Outpatient Medications (Other):    ALPRAZolam (XANAX) 0.5 MG tablet, Take 0.5 mg by mouth 2 (two) times daily as needed for anxiety or sleep.   Biotin 5 MG CAPS, Take 5 mg by mouth 2 (two) times daily.   Ca Carbonate-Mag Hydroxide (ROLAIDS PO), Take 2 each by mouth daily as needed (for acid reflux).   Calcium Carb-Cholecalciferol (CALCIUM + VITAMIN D3 PO), Take  2 tablets by mouth 2 (two) times daily.   Cholecalciferol (VITAMIN D3) 1000 units CAPS, Take 1,000 Units by mouth daily.   hydrocortisone (ANUSOL-HC) 2.5 % rectal cream, Place 1 Application rectally 2 (two) times daily.   hydrocortisone (ANUSOL-HC) 25 MG suppository, Place 1 suppository (25 mg total) rectally 2 (two) times daily.   Multiple Vitamin (MULTI-VITAMIN DAILY PO), Take 1 tablet by mouth daily.   omeprazole (PRILOSEC) 40 MG capsule, Take 1 capsule (40 mg total) by mouth 2 (two) times daily before a meal.   sertraline (ZOLOFT) 100 MG tablet, Take 100 mg by mouth daily.   sodium fluoride (FLUORISHIELD) 1.1 % GEL dental gel, Place onto teeth.   sucralfate (CARAFATE) 1 g tablet, Take 1 tablet (1 g total) by mouth 4 (four) times daily -  with meals and at bedtime.   vitamin C (ASCORBIC ACID) 500 MG tablet, Take 1,000 mg by mouth daily.    zinc gluconate 50 MG tablet,  Take 50 mg by mouth daily.  Medical History:  Past Medical History:  Diagnosis Date   Allergy    Anemia, iron deficiency    Anxiety    Atrial fibrillation ([place]) 01/2021   Benign essential tremor    Blood transfusion without reported diagnosis    Cancer ([place])    bacal cell nd squmous cell skin cancers   Cataract    Chronic kidney disease    Colon polyps    Depression    Diverticulosis    Dysphonia    Dysrhythmia    Fibrocystic Breast disease    pt denies   Gastric ulcer 2006   Gastrointestinal hemorrhage, hx of    GERD (gastroesophageal reflux disease)    History of COVID-19    History of hiatal hernia    Hx of basal cell carcinoma    and squamous cell skin cancer   Hyperlipidemia    Hypertension    Hypothyroidism    IBS (irritable bowel syndrome)    Internal hemorrhoids without mention of complication    LBBB (left bundle branch block)    Osteoarthritis    Overactive bladder    Stroke ([place]) 03/2018   Unspecified hypothyroidism    Vitamin D deficiency    Allergies:  Allergies  Allergen Reactions   Amoxicillin-Pot Clavulanate Other (See Comments)   Other     MYCINS   Sulfamethoxazole-Trimethoprim Other (See Comments)   Macrolides And Ketolides Rash   Niacin Rash     Surgical History:  She  has a past surgical history that includes Abdominal hysterectomy (1984); Left foot surgery; Submucosal resection; Thyroidectomy (2004); Laparoscopic repair of large hiatal hernia/nissen (10/2009); rhinoplasty with submocosal resection; Colonoscopy; Upper gastrointestinal endoscopy; Nissen fundoplication; Hernia repair (2011); Cataract extraction w/PHACO (Left, 12/04/2017); Cataract extraction w/PHACO (Right, 12/25/2017); laryngeal nerve medialization and reconstruction (07/13/2016); Foot surgery; PACEMAKER LEADLESS INSERTION (N/A, 03/07/2019); TEMPORARY PACEMAKER (N/A, 03/07/2019); Nasal sinus surgery; and tracheal /paraesophageal mass surgery (08/2013). Family History:  Her  family history includes Breast cancer in her cousin, maternal aunt, and sister; CVA in her father; Colitis in her cousin; Diabetes in her father; Heart disease in her mother.  REVIEW OF SYSTEMS  : All other systems reviewed and negative except where noted in the History of Present Illness.  PHYSICAL EXAM: BP 110/70   Pulse 62   Ht 5' 10.5" (1.791 m)   Wt 200 lb 8 oz (90.9 kg)   BMI 28.36 kg/m  General Appearance: Well nourished, in no apparent distress. Head:   Normocephalic and  atraumatic. Eyes:  sclerae anicteric,conjunctive pink  Respiratory: Respiratory effort normal, BS equal bilaterally without rales, rhonchi, wheezing. Cardio: RRR with no MRGs. Peripheral pulses intact.  Abdomen: Soft,  Obese ,active bowel sounds. No tenderness . No masses. Rectal: Normal external rectal exam, decreased/lack of rectal tone, large appreciated internal hemorrhoids, non-tender, no masses, hard large volume of brown stool, hemoccult Negative Musculoskeletal: Full ROM, Normal gait. Without edema. Skin:  Dry and intact without significant lesions or rashes Neuro: Alert and  oriented x4;  No focal deficits. Psych:  Cooperative. Normal mood and affect.    Vladimir Crofts, PA-C 2:43 PM

## 2022-05-19 NOTE — Progress Notes (Signed)
Addendum: Reviewed and agree with assessment and management plan. Agree with plan of action. She is not anemic.  Bleeding likely hemorrhoidal. Overflow loose stools seem most likely given DRE findings, purge seems reasonable. Tiernan Millikin, Lajuan Lines, MD

## 2022-05-19 NOTE — Patient Instructions (Addendum)
Your provider has requested that you have an abdominal x ray before leaving today. Please go to the basement floor to our Radiology department for the test.  You have been scheduled for an appointment with Dr. Hilarie Fredrickson on 08/02/22 at 2:30 pm . Please arrive 10 minutes early for your appointment.   We have sent the following medications to your pharmacy for you to pick up at your convenience: Hydrocortisone Cream, Hydrocortisone suppository, and Carafate.  Sending a medication called Carafate, this mechanically coats your stomach. Can cause constipation and darker stools. Take about 30 mins to 1 hour before food and before bed.  If the pill is too large to take, can dissolve it in water and a small orange juice glass or shot glass and take it more as a liquid. Try 2 weeks and if helps then can continue   If the hemorrhoid suppository sent in is too expensive you can do this over the counter trick.  Apply a pea size amount of over the counter Anusol HC cream to the tip of an over the counter PrepH suppository and insert rectally once every night for at least 7 nights.   Here some information about pelvic floor dysfunction. This may be contributing to some of your symptoms. We could also refer to pelvic floor physical therapy, let us know if youw ant.   Pelvic Floor Dysfunction, Female Pelvic floor dysfunction (PFD) is a condition that results when the group of muscles and connective tissues that support the organs in the pelvis (pelvic floor muscles) do not work well. These muscles and their connections form a sling that supports the colon and bladder. In women, they also support the uterus. PFD causes pelvic floor muscles to be too weak, too tight, or both. In PFD, muscle movements are not coordinated. This may cause bowel or bladder problems. It may also cause pain. What are the causes? This condition may be caused by an injury to the pelvic area or by a weakening of pelvic muscles. This often  results from pregnancy and childbirth or other types of strain. In many cases, the exact cause is not known. What increases the risk? The following factors may make you more likely to develop this condition: Having chronic bladder tissue inflammation (interstitial cystitis). Being an older person. Being overweight. History of radiation treatment for cancer in the pelvic region. Previous pelvic surgery, such as removal of the uterus (hysterectomy). What are the signs or symptoms? Symptoms of this condition vary and may include: Bladder symptoms, such as: Trouble starting urination and emptying the bladder. Frequent urinary tract infections. Leaking urine when coughing, laughing, or exercising (stress incontinence). Having to pass urine urgently or frequently. Pain when passing urine. Bowel symptoms, such as: Constipation. Urgent or frequent bowel movements. Incomplete bowel movements. Painful bowel movements. Leaking stool or gas. Unexplained genital or rectal pain. Genital or rectal muscle spasms. Low back pain. Other symptoms may include: A heavy, full, or aching feeling in the vagina. A bulge that protrudes into the vagina. Pain during or after sex. How is this diagnosed? This condition may be diagnosed based on: Your symptoms and medical history. A physical exam. During the exam, your health care provider may check your pelvic muscles for tightness, spasm, pain, or weakness. This may include a rectal exam and a pelvic exam. In some cases, you may have diagnostic tests, such as: Electrical muscle function tests. Urine flow testing. X-ray tests of bowel function. Ultrasound of the pelvic organs. How is this treated?  Treatment for this condition depends on the symptoms. Treatment options include: Physical therapy. This may include Kegel exercises to help relax or strengthen the pelvic floor muscles. Biofeedback. This type of therapy provides feedback on how tight your pelvic  floor muscles are so that you can learn to control them. Internal or external massage therapy. A treatment that involves electrical stimulation of the pelvic floor muscles to help control pain (transcutaneous electrical nerve stimulation, or TENS). Sound wave therapy (ultrasound) to reduce muscle spasms. Medicines, such as: Muscle relaxants. Bladder control medicines. Surgery to reconstruct or support pelvic floor muscles may be an option if other treatments do not help. Follow these instructions at home: Activity Do your usual activities as told by your health care provider. Ask your health care provider if you should modify any activities. Do pelvic floor strengthening or relaxing exercises at home as told by your physical therapist. Lifestyle Maintain a healthy weight. Eat foods that are high in fiber, such as beans, whole grains, and fresh fruits and vegetables. Limit foods that are high in fat and processed sugars, such as fried or sweet foods. Manage stress with relaxation techniques such as yoga or meditation. General instructions If you have problems with leakage: Use absorbable pads or wear padded underwear. Wash frequently with mild soap. Keep your genital and anal area as clean and dry as possible. Ask your health care provider if you should try a barrier cream to prevent skin irritation. Take warm baths to relieve pelvic muscle tension or spasms. Take over-the-counter and prescription medicines only as told by your health care provider. Keep all follow-up visits. How is this prevented? The cause of PFD is not always known, but there are a few things you can do to reduce the risk of developing this condition, including: Staying at a healthy weight. Getting regular exercise. Managing stress. Contact a health care provider if: Your symptoms are not improving with home care. You have signs or symptoms of PFD that get worse at home. You develop new signs or symptoms. You  have signs of a urinary tract infection, such as: Fever. Chills. Increased urinary frequency. A burning feeling when urinating. You have not had a bowel movement in 3 days (constipation). Summary Pelvic floor dysfunction results when the muscles and connective tissues in your pelvic floor do not work well. These muscles and their connections form a sling that supports your colon and bladder. In women, they also support the uterus. PFD may be caused by an injury to the pelvic area or by a weakening of pelvic muscles. PFD causes pelvic floor muscles to be too weak, too tight, or a combination of both. Symptoms may vary from person to person. In most cases, PFD can be treated with physical therapies and medicines. Surgery may be an option if other treatments do not help. This information is not intended to replace advice given to you by your health care provider. Make sure you discuss any questions you have with your health care provider. Document Revised: 07/28/2020 Document Reviewed: 07/28/2020 Elsevier Patient Education  [employer].

## 2022-05-24 DIAGNOSIS — D0461 Carcinoma in situ of skin of right upper limb, including shoulder: Secondary | ICD-10-CM | POA: Diagnosis not present

## 2022-05-24 DIAGNOSIS — L570 Actinic keratosis: Secondary | ICD-10-CM | POA: Diagnosis not present

## 2022-05-29 ENCOUNTER — Telehealth: Payer: Self-pay | Admitting: Physician Assistant

## 2022-05-29 NOTE — Telephone Encounter (Signed)
Inbound call from patient requesting a call back to discuss if Colestipol and Carafate interact with each other. Patient also stated that she has finished with her bowel cleanse. Please advise.

## 2022-05-29 NOTE — Telephone Encounter (Signed)
Left message for pt to call back.  Spoke with pt and let her know she should separate the colestipol and the carafate. Recommended she speak with her pharmacist about how to take the meds. She verbalized understanding.

## 2022-06-02 ENCOUNTER — Encounter: Payer: Self-pay | Admitting: Internal Medicine

## 2022-06-02 ENCOUNTER — Other Ambulatory Visit: Payer: Self-pay | Admitting: Internal Medicine

## 2022-06-02 DIAGNOSIS — Z1231 Encounter for screening mammogram for malignant neoplasm of breast: Secondary | ICD-10-CM

## 2022-06-14 DIAGNOSIS — I4510 Unspecified right bundle-branch block: Secondary | ICD-10-CM | POA: Diagnosis not present

## 2022-06-14 DIAGNOSIS — I5032 Chronic diastolic (congestive) heart failure: Secondary | ICD-10-CM | POA: Diagnosis not present

## 2022-06-14 DIAGNOSIS — R002 Palpitations: Secondary | ICD-10-CM | POA: Diagnosis not present

## 2022-06-15 DIAGNOSIS — M8580 Other specified disorders of bone density and structure, unspecified site: Secondary | ICD-10-CM | POA: Diagnosis not present

## 2022-06-15 DIAGNOSIS — E559 Vitamin D deficiency, unspecified: Secondary | ICD-10-CM | POA: Diagnosis not present

## 2022-06-21 ENCOUNTER — Ambulatory Visit
Admission: RE | Admit: 2022-06-21 | Discharge: 2022-06-21 | Disposition: A | Discharge status: Home / Self Care | Payer: Medicare PPO | Source: Ambulatory Visit | Attending: Internal Medicine | Admitting: Internal Medicine

## 2022-06-21 DIAGNOSIS — Z1231 Encounter for screening mammogram for malignant neoplasm of breast: Secondary | ICD-10-CM | POA: Diagnosis not present

## 2022-06-26 DIAGNOSIS — H43813 Vitreous degeneration, bilateral: Secondary | ICD-10-CM | POA: Diagnosis not present

## 2022-07-19 DIAGNOSIS — D6832 Hemorrhagic disorder due to extrinsic circulating anticoagulants: Secondary | ICD-10-CM | POA: Diagnosis not present

## 2022-07-19 DIAGNOSIS — M170 Bilateral primary osteoarthritis of knee: Secondary | ICD-10-CM | POA: Diagnosis not present

## 2022-08-02 ENCOUNTER — Ambulatory Visit: Payer: Medicare PPO | Admitting: Internal Medicine

## 2022-08-02 ENCOUNTER — Encounter: Payer: Self-pay | Admitting: Internal Medicine

## 2022-08-02 VITALS — BP 112/72 | HR 67 | Ht 70.5 in | Wt 196.0 lb

## 2022-08-02 DIAGNOSIS — K580 Irritable bowel syndrome with diarrhea: Secondary | ICD-10-CM | POA: Diagnosis not present

## 2022-08-02 DIAGNOSIS — K449 Diaphragmatic hernia without obstruction or gangrene: Secondary | ICD-10-CM | POA: Diagnosis not present

## 2022-08-02 DIAGNOSIS — K219 Gastro-esophageal reflux disease without esophagitis: Secondary | ICD-10-CM

## 2022-08-02 MED ORDER — OMEPRAZOLE 40 MG PO CPDR: 40.0000 mg | DELAYED_RELEASE_CAPSULE | Freq: Two times a day (BID) | ORAL | 3 refills | Status: DC

## 2022-08-02 NOTE — Patient Instructions (Addendum)
Take Colestid 1-2 grams by mouth once daily.   We have sent the following medications to your pharmacy for you to pick up at your convenience: Omeprazole  Follow up On : 11/29/22 at 11:00 am   _______________________________________________________  If your blood pressure at your visit was 140/90 or greater, please contact your primary care physician to follow up on this.  _______________________________________________________  If you are age 79 or older, your body mass index should be between 23-30. Your Body mass index is 27.73 kg/m. If this is out of the aforementioned range listed, please consider follow up with your Primary Care Provider.  If you are age 57 or younger, your body mass index should be between 19-25. Your Body mass index is 27.73 kg/m. If this is out of the aformentioned range listed, please consider follow up with your Primary Care Provider.   ________________________________________________________  The [facility] providers would like to encourage you to use [facility] to communicate with providers for non-urgent requests or questions.  Due to long hold times on the telephone, sending your provider a message by [facility] may be a faster and more efficient way to get a response.  Please allow 48 business hours for a response.  Please remember that this is for non-urgent requests.  _______________________________________________________  Thank you for choosing me and [facility].

## 2022-08-03 ENCOUNTER — Encounter: Payer: Self-pay | Admitting: Internal Medicine

## 2022-08-03 NOTE — Progress Notes (Signed)
Subjective:    Patient ID: Brandy Golden, female    DOB: 01-24-1946, 77 y.o.   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  She is here alone today and was last seen in February 2024 by Quentin Mulling, PA-C.  She is convinced that the blood that she was seeing several months ago was in fact vaginal.  She had 2 episodes but it has resolved completely.  She has not had rectal bleeding.  X-ray after her last visit suggested constipation and so she use MiraLAX and glycerin suppository.  She has gone back to using colestipol 1 g daily and with this she does not have diarrhea.  She will occasionally have minor leakage of bowel and bladder and uses about 2 pads per day.  With her bladder prolapse she has frequency and urinary leakage.  From a hiatal hernia perspective she is taking omeprazole 40 mg twice daily but having no heartburn.  She will occasionally feel that food sticks in her lower esophagus or upper stomach.  This is primarily with meats.  It is not progressive and not limiting her diet.  She wishes to avoid surgery if possible.   Review of Systems As per HPI, otherwise negative  Current Medications, Allergies, Past Medical History, Past Surgical History, Family History and Social History were reviewed in [employer] record.    Objective:   Physical Exam BP 112/72   Pulse 67   Ht 5' 10.5" (1.791 m)   Wt 196 lb (88.9 kg)   BMI 27.73 kg/m  Gen: awake, alert, NAD HEENT: anicteric  Ext: no c/c/e Neuro: nonfocal     Latest Ref Rng & Units 05/19/2022    1:04 PM 10/25/2020   10:24 AM 03/08/2019     6:01 AM  CBC  WBC 4.0 - 10.5 K/uL 6.9  7.2  7.2   Hemoglobin 12.0 - 15.0 g/dL 40.9  81.1  91.4   Hematocrit 36.0 - 46.0 % 38.4  39.2  37.9   Platelets 150.0 - 400.0 K/uL 159.0  173.0  131    TSH and CMP within normal limits when checked earlier this year by PCP      Assessment & Plan:   77 year old female with a history of GERD, large recurrent hiatal hernia after Nissen in 2011, history of multinodular goiter and 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   IBS with loose stools/pelvic floor dysfunction--colestipol 1 g works well for her to control her loose stools which are chronic in nature.  She is up-to-date with screening colonoscopy. --Continue colestipol 1 to 2 g once daily to control loose stools  2.  GERD with large recurrent hiatal hernia --asymptomatic other than mild intermittent dysphagia symptom.  This has been evaluated at [facility] as well as [facility].  She does not wish to redo hiatal hernia repair given her age, vocal cord paralysis, etc. which I think is very reasonable.  There is no evidence that this is causing significant symptoms or iron deficiency. --Continue omeprazole  40 mg twice daily --Routine blood and iron count monitoring per PCP  3.  Prior hemorrhoids --small and no longer bleeding.  Banding not recommended  4.  Prior iron deficiency --resolved.  Monitor blood and iron counts going forward  Follow-up with me in August for continuity  30 minutes total spent today including patient facing time, coordination of care, reviewing medical history/procedures/pertinent radiology studies, and documentation of the encounter.

## 2022-08-24 ENCOUNTER — Other Ambulatory Visit: Payer: Self-pay | Admitting: Internal Medicine

## 2022-10-02 DIAGNOSIS — R0602 Shortness of breath: Secondary | ICD-10-CM | POA: Diagnosis not present

## 2022-10-02 DIAGNOSIS — Z950 Presence of cardiac pacemaker: Secondary | ICD-10-CM | POA: Diagnosis not present

## 2022-10-02 DIAGNOSIS — E7800 Pure hypercholesterolemia, unspecified: Secondary | ICD-10-CM | POA: Diagnosis not present

## 2022-10-02 DIAGNOSIS — R002 Palpitations: Secondary | ICD-10-CM | POA: Diagnosis not present

## 2022-10-02 DIAGNOSIS — K449 Diaphragmatic hernia without obstruction or gangrene: Secondary | ICD-10-CM | POA: Diagnosis not present

## 2022-10-02 DIAGNOSIS — I480 Paroxysmal atrial fibrillation: Secondary | ICD-10-CM | POA: Diagnosis not present

## 2022-10-02 DIAGNOSIS — I4510 Unspecified right bundle-branch block: Secondary | ICD-10-CM | POA: Diagnosis not present

## 2022-10-02 DIAGNOSIS — N1831 Chronic kidney disease, stage 3a: Secondary | ICD-10-CM | POA: Diagnosis not present

## 2022-10-02 DIAGNOSIS — I10 Essential (primary) hypertension: Secondary | ICD-10-CM | POA: Diagnosis not present

## 2022-10-02 DIAGNOSIS — I5032 Chronic diastolic (congestive) heart failure: Secondary | ICD-10-CM | POA: Diagnosis not present

## 2022-10-09 DIAGNOSIS — R0602 Shortness of breath: Secondary | ICD-10-CM | POA: Diagnosis not present

## 2022-10-09 DIAGNOSIS — I5032 Chronic diastolic (congestive) heart failure: Secondary | ICD-10-CM | POA: Diagnosis not present

## 2022-10-11 DIAGNOSIS — I130 Hypertensive heart and chronic kidney disease with heart failure and stage 1 through stage 4 chronic kidney disease, or unspecified chronic kidney disease: Secondary | ICD-10-CM | POA: Diagnosis not present

## 2022-10-11 DIAGNOSIS — E559 Vitamin D deficiency, unspecified: Secondary | ICD-10-CM | POA: Diagnosis not present

## 2022-10-11 DIAGNOSIS — R739 Hyperglycemia, unspecified: Secondary | ICD-10-CM | POA: Diagnosis not present

## 2022-10-11 DIAGNOSIS — N1831 Chronic kidney disease, stage 3a: Secondary | ICD-10-CM | POA: Diagnosis not present

## 2022-10-11 DIAGNOSIS — I5032 Chronic diastolic (congestive) heart failure: Secondary | ICD-10-CM | POA: Diagnosis not present

## 2022-10-11 DIAGNOSIS — Z1212 Encounter for screening for malignant neoplasm of rectum: Secondary | ICD-10-CM | POA: Diagnosis not present

## 2022-10-11 DIAGNOSIS — E039 Hypothyroidism, unspecified: Secondary | ICD-10-CM | POA: Diagnosis not present

## 2022-10-11 DIAGNOSIS — M8580 Other specified disorders of bone density and structure, unspecified site: Secondary | ICD-10-CM | POA: Diagnosis not present

## 2022-10-11 DIAGNOSIS — E785 Hyperlipidemia, unspecified: Secondary | ICD-10-CM | POA: Diagnosis not present

## 2022-10-11 DIAGNOSIS — D509 Iron deficiency anemia, unspecified: Secondary | ICD-10-CM | POA: Diagnosis not present

## 2022-10-19 DIAGNOSIS — D509 Iron deficiency anemia, unspecified: Secondary | ICD-10-CM | POA: Diagnosis not present

## 2022-10-19 DIAGNOSIS — R5382 Chronic fatigue, unspecified: Secondary | ICD-10-CM | POA: Diagnosis not present

## 2022-10-19 DIAGNOSIS — Z23 Encounter for immunization: Secondary | ICD-10-CM | POA: Diagnosis not present

## 2022-10-19 DIAGNOSIS — N1831 Chronic kidney disease, stage 3a: Secondary | ICD-10-CM | POA: Diagnosis not present

## 2022-10-19 DIAGNOSIS — I129 Hypertensive chronic kidney disease with stage 1 through stage 4 chronic kidney disease, or unspecified chronic kidney disease: Secondary | ICD-10-CM | POA: Diagnosis not present

## 2022-10-19 DIAGNOSIS — Z1331 Encounter for screening for depression: Secondary | ICD-10-CM | POA: Diagnosis not present

## 2022-10-19 DIAGNOSIS — E039 Hypothyroidism, unspecified: Secondary | ICD-10-CM | POA: Diagnosis not present

## 2022-10-19 DIAGNOSIS — Z1212 Encounter for screening for malignant neoplasm of rectum: Secondary | ICD-10-CM | POA: Diagnosis not present

## 2022-10-19 DIAGNOSIS — F411 Generalized anxiety disorder: Secondary | ICD-10-CM | POA: Diagnosis not present

## 2022-10-19 DIAGNOSIS — Z0000 Encounter for general adult medical examination without abnormal findings: Secondary | ICD-10-CM | POA: Diagnosis not present

## 2022-10-19 DIAGNOSIS — I10 Essential (primary) hypertension: Secondary | ICD-10-CM | POA: Diagnosis not present

## 2022-10-19 DIAGNOSIS — R6889 Other general symptoms and signs: Secondary | ICD-10-CM | POA: Diagnosis not present

## 2022-10-19 DIAGNOSIS — D692 Other nonthrombocytopenic purpura: Secondary | ICD-10-CM | POA: Diagnosis not present

## 2022-10-19 DIAGNOSIS — R82998 Other abnormal findings in urine: Secondary | ICD-10-CM | POA: Diagnosis not present

## 2022-10-31 DIAGNOSIS — L658 Other specified nonscarring hair loss: Secondary | ICD-10-CM | POA: Diagnosis not present

## 2022-10-31 DIAGNOSIS — D2262 Melanocytic nevi of left upper limb, including shoulder: Secondary | ICD-10-CM | POA: Diagnosis not present

## 2022-10-31 DIAGNOSIS — D2272 Melanocytic nevi of left lower limb, including hip: Secondary | ICD-10-CM | POA: Diagnosis not present

## 2022-10-31 DIAGNOSIS — L821 Other seborrheic keratosis: Secondary | ICD-10-CM | POA: Diagnosis not present

## 2022-10-31 DIAGNOSIS — D2261 Melanocytic nevi of right upper limb, including shoulder: Secondary | ICD-10-CM | POA: Diagnosis not present

## 2022-10-31 DIAGNOSIS — Z08 Encounter for follow-up examination after completed treatment for malignant neoplasm: Secondary | ICD-10-CM | POA: Diagnosis not present

## 2022-10-31 DIAGNOSIS — Z85828 Personal history of other malignant neoplasm of skin: Secondary | ICD-10-CM | POA: Diagnosis not present

## 2022-10-31 DIAGNOSIS — D2271 Melanocytic nevi of right lower limb, including hip: Secondary | ICD-10-CM | POA: Diagnosis not present

## 2022-10-31 DIAGNOSIS — D225 Melanocytic nevi of trunk: Secondary | ICD-10-CM | POA: Diagnosis not present

## 2022-11-02 DIAGNOSIS — R051 Acute cough: Secondary | ICD-10-CM | POA: Diagnosis not present

## 2022-11-02 DIAGNOSIS — N390 Urinary tract infection, site not specified: Secondary | ICD-10-CM | POA: Diagnosis not present

## 2022-11-02 DIAGNOSIS — R319 Hematuria, unspecified: Secondary | ICD-10-CM | POA: Diagnosis not present

## 2022-11-02 DIAGNOSIS — R112 Nausea with vomiting, unspecified: Secondary | ICD-10-CM | POA: Diagnosis not present

## 2022-11-02 DIAGNOSIS — R1110 Vomiting, unspecified: Secondary | ICD-10-CM | POA: Diagnosis not present

## 2022-11-02 DIAGNOSIS — Z1159 Encounter for screening for other viral diseases: Secondary | ICD-10-CM | POA: Diagnosis not present

## 2022-11-02 DIAGNOSIS — R531 Weakness: Secondary | ICD-10-CM | POA: Diagnosis not present

## 2022-11-02 DIAGNOSIS — N1831 Chronic kidney disease, stage 3a: Secondary | ICD-10-CM | POA: Diagnosis not present

## 2022-11-07 DIAGNOSIS — K219 Gastro-esophageal reflux disease without esophagitis: Secondary | ICD-10-CM | POA: Diagnosis not present

## 2022-11-07 DIAGNOSIS — R32 Unspecified urinary incontinence: Secondary | ICD-10-CM | POA: Diagnosis not present

## 2022-11-07 DIAGNOSIS — F411 Generalized anxiety disorder: Secondary | ICD-10-CM | POA: Diagnosis not present

## 2022-11-07 DIAGNOSIS — F321 Major depressive disorder, single episode, moderate: Secondary | ICD-10-CM | POA: Diagnosis not present

## 2022-11-07 DIAGNOSIS — Z85828 Personal history of other malignant neoplasm of skin: Secondary | ICD-10-CM | POA: Diagnosis not present

## 2022-11-07 DIAGNOSIS — Z8249 Family history of ischemic heart disease and other diseases of the circulatory system: Secondary | ICD-10-CM | POA: Diagnosis not present

## 2022-11-07 DIAGNOSIS — Z809 Family history of malignant neoplasm, unspecified: Secondary | ICD-10-CM | POA: Diagnosis not present

## 2022-11-07 DIAGNOSIS — R2681 Unsteadiness on feet: Secondary | ICD-10-CM | POA: Diagnosis not present

## 2022-11-07 DIAGNOSIS — K589 Irritable bowel syndrome without diarrhea: Secondary | ICD-10-CM | POA: Diagnosis not present

## 2022-11-07 DIAGNOSIS — I951 Orthostatic hypotension: Secondary | ICD-10-CM | POA: Diagnosis not present

## 2022-11-07 DIAGNOSIS — N189 Chronic kidney disease, unspecified: Secondary | ICD-10-CM | POA: Diagnosis not present

## 2022-11-07 DIAGNOSIS — R002 Palpitations: Secondary | ICD-10-CM | POA: Diagnosis not present

## 2022-11-07 DIAGNOSIS — I4430 Unspecified atrioventricular block: Secondary | ICD-10-CM | POA: Diagnosis not present

## 2022-11-07 DIAGNOSIS — E039 Hypothyroidism, unspecified: Secondary | ICD-10-CM | POA: Diagnosis not present

## 2022-11-07 DIAGNOSIS — G629 Polyneuropathy, unspecified: Secondary | ICD-10-CM | POA: Diagnosis not present

## 2022-11-07 DIAGNOSIS — Z9181 History of falling: Secondary | ICD-10-CM | POA: Diagnosis not present

## 2022-11-07 DIAGNOSIS — Z7901 Long term (current) use of anticoagulants: Secondary | ICD-10-CM | POA: Diagnosis not present

## 2022-11-07 DIAGNOSIS — E785 Hyperlipidemia, unspecified: Secondary | ICD-10-CM | POA: Diagnosis not present

## 2022-11-07 DIAGNOSIS — Z881 Allergy status to other antibiotic agents status: Secondary | ICD-10-CM | POA: Diagnosis not present

## 2022-11-08 DIAGNOSIS — N1831 Chronic kidney disease, stage 3a: Secondary | ICD-10-CM | POA: Diagnosis not present

## 2022-11-08 DIAGNOSIS — R0602 Shortness of breath: Secondary | ICD-10-CM | POA: Diagnosis not present

## 2022-11-08 DIAGNOSIS — Z950 Presence of cardiac pacemaker: Secondary | ICD-10-CM | POA: Diagnosis not present

## 2022-11-08 DIAGNOSIS — R002 Palpitations: Secondary | ICD-10-CM | POA: Diagnosis not present

## 2022-11-08 DIAGNOSIS — I5032 Chronic diastolic (congestive) heart failure: Secondary | ICD-10-CM | POA: Diagnosis not present

## 2022-11-08 DIAGNOSIS — I10 Essential (primary) hypertension: Secondary | ICD-10-CM | POA: Diagnosis not present

## 2022-11-22 DIAGNOSIS — E039 Hypothyroidism, unspecified: Secondary | ICD-10-CM | POA: Diagnosis not present

## 2022-11-22 DIAGNOSIS — R7989 Other specified abnormal findings of blood chemistry: Secondary | ICD-10-CM | POA: Diagnosis not present

## 2022-11-22 DIAGNOSIS — N390 Urinary tract infection, site not specified: Secondary | ICD-10-CM | POA: Diagnosis not present

## 2022-11-22 DIAGNOSIS — E785 Hyperlipidemia, unspecified: Secondary | ICD-10-CM | POA: Diagnosis not present

## 2022-11-29 ENCOUNTER — Encounter: Payer: Self-pay | Admitting: Internal Medicine

## 2022-11-29 ENCOUNTER — Ambulatory Visit: Payer: Medicare PPO | Admitting: Internal Medicine

## 2022-11-29 VITALS — BP 116/60 | HR 70 | Ht 70.0 in | Wt 197.0 lb

## 2022-11-29 DIAGNOSIS — K449 Diaphragmatic hernia without obstruction or gangrene: Secondary | ICD-10-CM

## 2022-11-29 DIAGNOSIS — K580 Irritable bowel syndrome with diarrhea: Secondary | ICD-10-CM | POA: Diagnosis not present

## 2022-11-29 DIAGNOSIS — K219 Gastro-esophageal reflux disease without esophagitis: Secondary | ICD-10-CM

## 2022-11-29 DIAGNOSIS — R0602 Shortness of breath: Secondary | ICD-10-CM

## 2022-11-29 MED ORDER — COLESTIPOL HCL 1 G PO TABS: 3.0000 g | ORAL_TABLET | Freq: Every day | ORAL | 1 refills | Status: DC

## 2022-11-29 NOTE — Progress Notes (Signed)
Subjective:    Patient ID: Brandy Golden, female    DOB: 1945/10/03, 77 y.o.   MRN: [id number] Brandy Golden is a 77 year old female with a history of GERD, large recurrent hiatal hernia after Nissen in 2011 (Dr. 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  She is here alone today and I last saw her on 08/02/2022.  She reports that she has had progressive issues with her shortness of breath.  She states that this has been somewhat baffling to her cardiologist, pulmonologist and primary care providers.  She gets short of breath with taking short walks.  She tried physical therapy and cardiopulmonary therapy without much benefit.  She is wondering if this could be related to her large hiatal hernia.  Her reflux is mostly controlled though she does occasionally have some minor dysphagia.  She is taking omeprazole 40 mg twice daily.  Her chronic loose stools are better with colestipol although she can still have some accidents.  She goes through about 2 adult depends per day with urinary and fecal smearing.  She typically has 4-5 large soft to loose bowel movements daily.  She is taking 2 g of colestipol midday.  No blood in stool or melena.  She has recently been treated for UTI.   Review of Systems As per HPI, otherwise negative  Current Medications, Allergies, Past Medical History, Past Surgical History, Family History and Social History were reviewed in [employer] record.    Objective:   Physical Exam BP 116/60   Pulse 70   Ht 5\' 10"  (1.778 m)   Wt 197 lb (89.4 kg)   SpO2 95%   BMI 28.27 kg/m  Gen: awake, alert, NAD HEENT: anicteric  CV: RRR, no mrg Pulm: Coarse breath sounds bilateral, bronchial in nature, bowel sounds heard over the lower  lung fields bilaterally Abd: soft, NT/ND, +BS throughout Ext: no c/c/e Neuro: nonfocal     Assessment & Plan:  77 year old female with a history of GERD, large recurrent hiatal hernia after Nissen in 2011 (Dr. 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   Large hiatal hernia (prior repair and recurrence)/GERD and moderate dysphagia/dyspnea --I am suspicious that her large hiatal hernia is at least contributing (to what extent unclear) to her dyspnea.  She has increased dyspnea after eating but also with activity.  She has followed with cardiology and pulmonology.  She reports that her oxygen saturations have remained normal. -- CT chest; evaluate size of hiatal hernia -- She is encouraged to follow-up with pulmonology at this time -- If hiatal hernia is in fact contributing to her dyspnea symptoms we could have her see Dr. Otilio Carpen at [facility] for his opinion regarding repair -- Continue omeprazole 40 mg twice daily  2.  IBS with loose stools/pelvic floor dysfunction --we will try an increased dose of colestipol -- Increase colestipol to 3 g once daily  3.  Hemorrhoids --intermittently symptomatic without significant bleeding.  4.  IDA --resolved.  Primary care to monitor blood counts and iron stores going forward  40 minutes total spent today including patient facing time, coordination of care, reviewing medical history/procedures/pertinent radiology studies,  and documentation of the encounter.

## 2022-11-29 NOTE — Patient Instructions (Addendum)
We have sent the following medications to your pharmacy for you to pick up at your convenience: Colestipol 3 mg daily.   Continue omeprazole 40 mg twice daily.   Please follow up with your pulmonologist.  You have been scheduled for a CT scan of the chest at [facility]. You are scheduled on 12/07/22 at 2:00pm. You should arrive 15 minutes prior to your appointment time for registration.    If you have any questions regarding your exam or if you need to reschedule, you may call scheduling at [phone] between the hours of 8:00 am and 5:00 pm, Monday-Friday.    _______________________________________________________  If your blood pressure at your visit was 140/90 or greater, please contact your primary care physician to follow up on this.  _______________________________________________________  If you are age 70 or older, your body mass index should be between 23-30. Your Body mass index is 28.27 kg/m. If this is out of the aforementioned range listed, please consider follow up with your Primary Care Provider.  If you are age 23 or younger, your body mass index should be between 19-25. Your Body mass index is 28.27 kg/m. If this is out of the aformentioned range listed, please consider follow up with your Primary Care Provider.   ________________________________________________________  The [facility] providers would like to encourage you to use [facility] to communicate with providers for non-urgent requests or questions.  Due to long hold times on the telephone, sending your provider a message by [facility] may be a faster and more efficient way to get a response.  Please allow 48 business hours for a response.  Please remember that this is for non-urgent requests.  _______________________________________________________

## 2022-12-01 DIAGNOSIS — R351 Nocturia: Secondary | ICD-10-CM | POA: Diagnosis not present

## 2022-12-01 DIAGNOSIS — N3020 Other chronic cystitis without hematuria: Secondary | ICD-10-CM | POA: Diagnosis not present

## 2022-12-01 DIAGNOSIS — N3946 Mixed incontinence: Secondary | ICD-10-CM | POA: Diagnosis not present

## 2022-12-07 ENCOUNTER — Ambulatory Visit
Admission: RE | Admit: 2022-12-07 | Discharge: 2022-12-07 | Disposition: A | Discharge status: Home / Self Care | Payer: Medicare PPO | Source: Ambulatory Visit | Attending: Internal Medicine | Admitting: Internal Medicine

## 2022-12-07 DIAGNOSIS — I700 Atherosclerosis of aorta: Secondary | ICD-10-CM | POA: Diagnosis not present

## 2022-12-07 DIAGNOSIS — K449 Diaphragmatic hernia without obstruction or gangrene: Secondary | ICD-10-CM | POA: Insufficient documentation

## 2022-12-07 DIAGNOSIS — R0602 Shortness of breath: Secondary | ICD-10-CM | POA: Insufficient documentation

## 2022-12-07 LAB — POCT I-STAT CREATININE: Creatinine, Ser: 1.2 mg/dL — ABNORMAL HIGH (ref 0.44–1.00)

## 2022-12-07 MED ORDER — IOHEXOL 300 MG/ML  SOLN
100.0000 mL | Freq: Once | INTRAMUSCULAR | Status: AC | PRN
  Administered 2022-12-07: 75 mL via INTRAVENOUS

## 2022-12-11 NOTE — Group Note (Deleted)

## 2022-12-12 DIAGNOSIS — I442 Atrioventricular block, complete: Secondary | ICD-10-CM | POA: Diagnosis not present

## 2023-02-02 DIAGNOSIS — N3020 Other chronic cystitis without hematuria: Secondary | ICD-10-CM | POA: Diagnosis not present

## 2023-02-05 DIAGNOSIS — K449 Diaphragmatic hernia without obstruction or gangrene: Secondary | ICD-10-CM | POA: Diagnosis not present

## 2023-02-05 DIAGNOSIS — K582 Mixed irritable bowel syndrome: Secondary | ICD-10-CM | POA: Diagnosis not present

## 2023-02-05 DIAGNOSIS — I129 Hypertensive chronic kidney disease with stage 1 through stage 4 chronic kidney disease, or unspecified chronic kidney disease: Secondary | ICD-10-CM | POA: Diagnosis not present

## 2023-02-05 DIAGNOSIS — F411 Generalized anxiety disorder: Secondary | ICD-10-CM | POA: Diagnosis not present

## 2023-02-05 DIAGNOSIS — N390 Urinary tract infection, site not specified: Secondary | ICD-10-CM | POA: Diagnosis not present

## 2023-02-05 DIAGNOSIS — R0600 Dyspnea, unspecified: Secondary | ICD-10-CM | POA: Diagnosis not present

## 2023-02-05 DIAGNOSIS — K219 Gastro-esophageal reflux disease without esophagitis: Secondary | ICD-10-CM | POA: Diagnosis not present

## 2023-02-05 DIAGNOSIS — N3281 Overactive bladder: Secondary | ICD-10-CM | POA: Diagnosis not present

## 2023-02-05 DIAGNOSIS — F3341 Major depressive disorder, recurrent, in partial remission: Secondary | ICD-10-CM | POA: Diagnosis not present

## 2023-02-16 DIAGNOSIS — M79671 Pain in right foot: Secondary | ICD-10-CM | POA: Diagnosis not present

## 2023-02-16 DIAGNOSIS — M79672 Pain in left foot: Secondary | ICD-10-CM | POA: Diagnosis not present

## 2023-02-19 DIAGNOSIS — M3501 Sicca syndrome with keratoconjunctivitis: Secondary | ICD-10-CM | POA: Diagnosis not present

## 2023-02-19 DIAGNOSIS — H02006 Unspecified entropion of left eye, unspecified eyelid: Secondary | ICD-10-CM | POA: Diagnosis not present

## 2023-02-19 DIAGNOSIS — H02055 Trichiasis without entropian left lower eyelid: Secondary | ICD-10-CM | POA: Diagnosis not present

## 2023-03-12 DIAGNOSIS — J209 Acute bronchitis, unspecified: Secondary | ICD-10-CM | POA: Diagnosis not present

## 2023-03-12 DIAGNOSIS — K449 Diaphragmatic hernia without obstruction or gangrene: Secondary | ICD-10-CM | POA: Diagnosis not present

## 2023-03-12 DIAGNOSIS — J219 Acute bronchiolitis, unspecified: Secondary | ICD-10-CM | POA: Diagnosis not present

## 2023-03-14 DIAGNOSIS — H02006 Unspecified entropion of left eye, unspecified eyelid: Secondary | ICD-10-CM | POA: Diagnosis not present

## 2023-03-14 DIAGNOSIS — M3501 Sicca syndrome with keratoconjunctivitis: Secondary | ICD-10-CM | POA: Diagnosis not present

## 2023-03-14 DIAGNOSIS — H02055 Trichiasis without entropian left lower eyelid: Secondary | ICD-10-CM | POA: Diagnosis not present

## 2023-03-14 DIAGNOSIS — I10 Essential (primary) hypertension: Secondary | ICD-10-CM | POA: Diagnosis not present

## 2023-03-14 DIAGNOSIS — E7800 Pure hypercholesterolemia, unspecified: Secondary | ICD-10-CM | POA: Diagnosis not present

## 2023-03-14 DIAGNOSIS — Z950 Presence of cardiac pacemaker: Secondary | ICD-10-CM | POA: Diagnosis not present

## 2023-03-14 DIAGNOSIS — R002 Palpitations: Secondary | ICD-10-CM | POA: Diagnosis not present

## 2023-03-14 DIAGNOSIS — Z23 Encounter for immunization: Secondary | ICD-10-CM | POA: Diagnosis not present

## 2023-03-14 DIAGNOSIS — I4510 Unspecified right bundle-branch block: Secondary | ICD-10-CM | POA: Diagnosis not present

## 2023-03-14 DIAGNOSIS — R0602 Shortness of breath: Secondary | ICD-10-CM | POA: Diagnosis not present

## 2023-03-14 DIAGNOSIS — I5032 Chronic diastolic (congestive) heart failure: Secondary | ICD-10-CM | POA: Diagnosis not present

## 2023-04-05 ENCOUNTER — Other Ambulatory Visit: Payer: Self-pay | Admitting: Internal Medicine

## 2023-04-12 DIAGNOSIS — R600 Localized edema: Secondary | ICD-10-CM | POA: Diagnosis not present

## 2023-04-12 DIAGNOSIS — R0602 Shortness of breath: Secondary | ICD-10-CM | POA: Diagnosis not present

## 2023-04-23 ENCOUNTER — Ambulatory Visit: Payer: Medicare Other | Admitting: Surgery

## 2023-04-26 DIAGNOSIS — I4891 Unspecified atrial fibrillation: Secondary | ICD-10-CM | POA: Diagnosis not present

## 2023-04-26 DIAGNOSIS — N390 Urinary tract infection, site not specified: Secondary | ICD-10-CM | POA: Diagnosis not present

## 2023-04-26 DIAGNOSIS — F3341 Major depressive disorder, recurrent, in partial remission: Secondary | ICD-10-CM | POA: Diagnosis not present

## 2023-04-26 DIAGNOSIS — N1831 Chronic kidney disease, stage 3a: Secondary | ICD-10-CM | POA: Diagnosis not present

## 2023-04-26 DIAGNOSIS — R0600 Dyspnea, unspecified: Secondary | ICD-10-CM | POA: Diagnosis not present

## 2023-04-26 DIAGNOSIS — K449 Diaphragmatic hernia without obstruction or gangrene: Secondary | ICD-10-CM | POA: Diagnosis not present

## 2023-04-26 DIAGNOSIS — I5032 Chronic diastolic (congestive) heart failure: Secondary | ICD-10-CM | POA: Diagnosis not present

## 2023-04-26 DIAGNOSIS — I129 Hypertensive chronic kidney disease with stage 1 through stage 4 chronic kidney disease, or unspecified chronic kidney disease: Secondary | ICD-10-CM | POA: Diagnosis not present

## 2023-04-26 DIAGNOSIS — N3281 Overactive bladder: Secondary | ICD-10-CM | POA: Diagnosis not present

## 2023-05-01 DIAGNOSIS — H02055 Trichiasis without entropian left lower eyelid: Secondary | ICD-10-CM | POA: Diagnosis not present

## 2023-05-07 ENCOUNTER — Encounter (INDEPENDENT_AMBULATORY_CARE_PROVIDER_SITE_OTHER): Payer: Self-pay | Admitting: Nurse Practitioner

## 2023-05-07 ENCOUNTER — Ambulatory Visit (INDEPENDENT_AMBULATORY_CARE_PROVIDER_SITE_OTHER): Payer: Medicare Other | Admitting: Nurse Practitioner

## 2023-05-07 VITALS — BP 118/75 | HR 62 | Resp 18 | Ht 70.0 in | Wt 199.4 lb

## 2023-05-07 DIAGNOSIS — I5032 Chronic diastolic (congestive) heart failure: Secondary | ICD-10-CM

## 2023-05-07 DIAGNOSIS — I10 Essential (primary) hypertension: Secondary | ICD-10-CM

## 2023-05-07 DIAGNOSIS — M7989 Other specified soft tissue disorders: Secondary | ICD-10-CM

## 2023-05-07 NOTE — Progress Notes (Unsigned)
Subjective:    Patient ID: Brandy Golden, female    DOB: 04/18/1945, 78 y.o.   MRN: [id number] Chief Complaint  Patient presents with   New Patient (Initial Visit)    np. consult. Lower extremity edema. ref: Aleskerov,Faud    Patient is a 78 year old female who presents today for evaluation for lower extremity edema.  She notes that usually her right leg is more swollen than her left.  This is ongoing for years.  But recently the left has begun to swell as well.  She notes that when she was recently seen by Dr. Lowry Ram off she had some medication changes including the addition of torsemide (to be taken as needed) and spironolactone.  Following these changes she lost approximately 10 pounds 1 week and the swelling is much improved.  She currently utilizes medical grade compression stockings regularly.  She also elevates her lower extremities but has difficulty with activity and ambulation.  She has numb feet and was tested years ago for polyneuropathy.  Her feet are numb and they have been for years but due to that she has falls due to the numbness which limits her mobility.  In addition she has some issues with incontinence as well.  The other thing altering her ability for exercise is dyspnea if she is unable to walk or do any significant distances without shortness of breath.  She recently had a visit with her cardiologist who noted that her cardiac function was stable.  She has a history of HFpEF.  Currently the patient has no open wounds or ulcerations.    Review of Systems  Respiratory:  Positive for shortness of breath.   Cardiovascular:  Positive for leg swelling.  Musculoskeletal:  Positive for gait problem.  Neurological:  Positive for numbness.  All other systems reviewed and are negative.      Objective:   Physical Exam Vitals reviewed.  HENT:     Head: Normocephalic.  Cardiovascular:     Rate and Rhythm: Normal rate and regular rhythm.     Pulses:          Dorsalis pedis  pulses are 1+ on the right side and 1+ on the left side.  Pulmonary:     Effort: Pulmonary effort is normal.  Musculoskeletal:     Right lower leg: Edema present.     Left lower leg: Edema present.  Skin:    General: Skin is warm and dry.  Neurological:     Mental Status: She is alert and oriented to person, place, and time.     Gait: Gait abnormal.  Psychiatric:        Mood and Affect: Mood normal.        Behavior: Behavior normal.        Thought Content: Thought content normal.        Judgment: Judgment normal.     BP 118/75   Pulse 62   Resp 18   Ht 5\' 10"  (1.778 m)   Wt 199 lb 6.4 oz (90.4 kg)   BMI 28.61 kg/m   Past Medical History:  Diagnosis Date   Allergy    Anemia, iron deficiency    Anxiety    Atrial fibrillation (HCC) 01/2021   Benign essential tremor    Blood transfusion without reported diagnosis    Cancer (HCC)    bacal cell nd squmous cell skin cancers   Cataract    Chronic kidney disease    Colon polyps    Depression  Diverticulosis    Dysphonia    Dysrhythmia    Fibrocystic Breast disease    pt denies   Gastric ulcer 2006   Gastrointestinal hemorrhage, hx of    GERD (gastroesophageal reflux disease)    History of COVID-19    History of hiatal hernia    Hx of basal cell carcinoma    and squamous cell skin cancer   Hyperlipidemia    Hypertension    Hypothyroidism    IBS (irritable bowel syndrome)    Internal hemorrhoids without mention of complication    LBBB (left bundle branch block)    Osteoarthritis    Overactive bladder    Stroke (HCC) 03/2018   Unspecified hypothyroidism    Vitamin D deficiency     Social History   Socioeconomic History   Marital status: Married    Spouse name: Not on file   Number of children: 2   Years of education: Not on file   Highest education level: Not on file  Occupational History   Occupation: Retired Runner, broadcasting/film/video  Tobacco Use   Smoking status: Never   Smokeless tobacco: Never  Vaping Use    Vaping status: Never Used  Substance and Sexual Activity   Alcohol use: Yes    Comment: Occassional wine   Drug use: No   Sexual activity: Not on file  Other Topics Concern   Not on file  Social History Narrative   Not on file   Social Drivers of Health   Financial Resource Strain: Low Risk  (03/07/2023)   Received from [facility]   Overall Financial Resource Strain (CARDIA)    Difficulty of Paying Living Expenses: Not hard at all  Food Insecurity: No Food Insecurity (03/07/2023)   Received from [facility]   Hunger Vital Sign    Worried About Running Out of Food in the Last Year: Never true    Ran Out of Food in the Last Year: Never true  Transportation Needs: No Transportation Needs (03/07/2023)   Received from [facility]    In the past 12 months, has lack of transportation kept you from medical appointments or from getting medications?: No    Lack of Transportation (Non-Medical): No  Physical Activity: Not on file  Stress: Not on file  Social Connections: Unknown (11/02/2022)   Received from [facility]   Social Network    Social Network: Not on file  Intimate Partner Violence: Unknown (11/02/2022)   Received from [facility]   HITS    Physically Hurt: Not on file    Insult or Talk Down To: Not on file    Threaten Physical Harm: Not on file    Scream or Curse: Not on file    Past Surgical History:  Procedure Laterality Date   ABDOMINAL HYSTERECTOMY  1984   CATARACT EXTRACTION W/PHACO Left 12/04/2017   Procedure: CATARACT EXTRACTION PHACO AND INTRAOCULAR LENS PLACEMENT (IOC);  Surgeon: Galen Manila, MD;  Location: [facility] ORS;  Service: Ophthalmology;  Laterality: Left;  [place] 01: 13.5 AP% 18.8 CDE 13.85 Fluid Pack lot # [id number]   CATARACT EXTRACTION W/PHACO Right 12/25/2017   Procedure: CATARACT EXTRACTION PHACO AND INTRAOCULAR LENS PLACEMENT (IOC);  Surgeon: Galen Manila, MD;   Location: [facility] ORS;  Service: Ophthalmology;  Laterality: Right;  [place]  00:35 AP% 17.5 CDE 6.23 Fluid pack lot # [id number]   COLONOSCOPY     FOOT SURGERY     HERNIA REPAIR  2011   Laparoscopic repair of large hiatal hernia/nissen  10/2009   Dr. Luretha Murphy   laryngeal nerve medialization and reconstruction  07/13/2016   Left foot surgery     NASAL SINUS SURGERY     NISSEN FUNDOPLICATION     PACEMAKER LEADLESS INSERTION N/A 03/07/2019   Procedure: PACEMAKER LEADLESS INSERTION;  Surgeon: Marcina Millard, MD;  Location: [facility] INVASIVE CV LAB;  Service: Cardiovascular;  Laterality: N/A;   rhinoplasty with submocosal resection     Submucosal resection     TEMPORARY PACEMAKER N/A 03/07/2019   Procedure: TEMPORARY PACEMAKER;  Surgeon: Marcina Millard, MD;  Location: [facility] INVASIVE CV LAB;  Service: Cardiovascular;  Laterality: N/A;   THYROIDECTOMY  2004   tracheal /paraesophageal mass surgery  08/2013   cartilage surgery and gortex build up of cords   UPPER GASTROINTESTINAL ENDOSCOPY      Family History  Problem Relation Age of Onset   Diabetes Father        2 aunts, 1 cousin   CVA Father    Heart disease Mother    Breast cancer Sister    Colitis Cousin    Breast cancer Cousin    Breast cancer Maternal Aunt    Colon cancer Neg Hx    Esophageal cancer Neg Hx    Rectal cancer Neg Hx    Stomach cancer Neg Hx     Allergies  Allergen Reactions   Amoxicillin-Pot Clavulanate Other (See Comments)   Other     MYCINS   Sulfamethoxazole-Trimethoprim Other (See Comments)   Macrolides And Ketolides Rash   Niacin Rash       Latest Ref Rng & Units 05/19/2022    1:04 PM 10/25/2020   10:24 AM 03/08/2019    6:01 AM  CBC  WBC 4.0 - 10.5 K/uL 6.9  7.2  7.2   Hemoglobin 12.0 - 15.0 g/dL 40.9  81.1  91.4   Hematocrit 36.0 - 46.0 % 38.4  39.2  37.9   Platelets 150.0 - 400.0 K/uL 159.0  173.0  131       CMP     Component Value Date/Time   NA 140 03/08/2019 0601   K 4.6  03/08/2019 0601   CL 105 03/08/2019 0601   CO2 27 03/08/2019 0601   GLUCOSE 135 (H) 03/08/2019 0601   BUN 14 03/08/2019 0601   CREATININE 1.20 (H) 12/07/2022 1407   CALCIUM 9.2 03/08/2019 0601   PROT 6.9 03/04/2019 1411   ALBUMIN 3.9 03/04/2019 1411   AST 22 03/04/2019 1411   ALT 19 03/04/2019 1411   ALKPHOS 67 03/04/2019 1411   BILITOT 0.9 03/04/2019 1411   GFRNONAA >60 03/08/2019 0601     No results found.     Assessment & Plan:   1. Leg swelling (Primary) I had a discussion with the patient regarding all the different variable causes of lower extremity edema involving vascular versus nonvascular causes.  I suspect that overall she likely has multifactorial causes of swelling.  In order to evaluate if there is a venous component we will have her return with a bilateral venous reflux studies.  She is advised to continue engaging in conservative therapy including medical grade compression and elevation.  We also discussed several exercise options which would be helpful with controlling her lower extremity edema.  2. Essential hypertension Continue antihypertensive medications as already ordered, these medications have been reviewed and there are no changes at this time.  3. Chronic diastolic CHF (congestive heart failure) (HCC) I  suspect that with this incidence since her heart failure may have played a small role in her swelling.   Current Outpatient Medications on File Prior to Visit  Medication Sig Dispense Refill   ALPRAZolam (XANAX) 0.5 MG tablet Take 0.5 mg by mouth 2 (two) times daily as needed for anxiety or sleep.     Biotin 5 MG CAPS Take 5 mg by mouth 2 (two) times daily.     Ca Carbonate-Mag Hydroxide (ROLAIDS PO) Take 2 each by mouth daily as needed (for acid reflux).     Calcium Carb-Cholecalciferol (CALCIUM + VITAMIN D3 PO) Take 2 tablets by mouth 2 (two) times daily.     Cholecalciferol (VITAMIN D3) 1000 units CAPS Take 1,000 Units by mouth daily.     colestipol  (COLESTID) 1 g tablet TAKE 3 TABLETS BY MOUTH DAILY 270 tablet 1   ELIQUIS 5 MG TABS tablet Take 5 mg by mouth 2 (two) times daily.     fluticasone (FLONASE) 50 MCG/ACT nasal spray TAKE 2 SPRAYS INTO EACH NOSTRIL AT BEDTIME     Multiple Vitamin (MULTI-VITAMIN DAILY PO) Take 1 tablet by mouth daily.     omeprazole (PRILOSEC) 40 MG capsule Take 1 capsule (40 mg total) by mouth 2 (two) times daily before a meal. 180 capsule 3   sertraline (ZOLOFT) 100 MG tablet Take 100 mg by mouth daily.     spironolactone (ALDACTONE) 25 MG tablet Take 25 mg by mouth daily.     torsemide (DEMADEX) 20 MG tablet Take 20 mg by mouth daily.     vitamin C (ASCORBIC ACID) 500 MG tablet Take 1,000 mg by mouth daily.      hydrocortisone (ANUSOL-HC) 2.5 % rectal cream Place 1 Application rectally 2 (two) times daily. (Patient not taking: Reported on 05/07/2023) 30 g 2   sodium fluoride (FLUORISHIELD) 1.1 % GEL dental gel Place onto teeth. (Patient not taking: Reported on 05/07/2023)     sucralfate (CARAFATE) 1 g tablet Take 1 tablet (1 g total) by mouth 4 (four) times daily -  with meals and at bedtime. (Patient not taking: Reported on 05/07/2023) 120 tablet 0   SYNTHROID 125 MCG tablet Take 125 mcg by mouth daily before breakfast.  (Patient not taking: Reported on 05/07/2023)     zinc gluconate 50 MG tablet Take 50 mg by mouth daily. (Patient not taking: Reported on 05/07/2023)     No current facility-administered medications on file prior to visit.    There are no Patient Instructions on file for this visit. No follow-ups on file.   Georgiana Spinner, NP

## 2023-05-09 DIAGNOSIS — N3946 Mixed incontinence: Secondary | ICD-10-CM | POA: Diagnosis not present

## 2023-05-09 DIAGNOSIS — N3020 Other chronic cystitis without hematuria: Secondary | ICD-10-CM | POA: Diagnosis not present

## 2023-05-09 DIAGNOSIS — R351 Nocturia: Secondary | ICD-10-CM | POA: Diagnosis not present

## 2023-05-14 DIAGNOSIS — H02055 Trichiasis without entropian left lower eyelid: Secondary | ICD-10-CM | POA: Diagnosis not present

## 2023-05-14 DIAGNOSIS — R053 Chronic cough: Secondary | ICD-10-CM | POA: Diagnosis not present

## 2023-05-14 DIAGNOSIS — R600 Localized edema: Secondary | ICD-10-CM | POA: Diagnosis not present

## 2023-05-14 DIAGNOSIS — J3489 Other specified disorders of nose and nasal sinuses: Secondary | ICD-10-CM | POA: Diagnosis not present

## 2023-05-29 DIAGNOSIS — D2262 Melanocytic nevi of left upper limb, including shoulder: Secondary | ICD-10-CM | POA: Diagnosis not present

## 2023-05-29 DIAGNOSIS — L304 Erythema intertrigo: Secondary | ICD-10-CM | POA: Diagnosis not present

## 2023-05-29 DIAGNOSIS — L570 Actinic keratosis: Secondary | ICD-10-CM | POA: Diagnosis not present

## 2023-05-29 DIAGNOSIS — D2261 Melanocytic nevi of right upper limb, including shoulder: Secondary | ICD-10-CM | POA: Diagnosis not present

## 2023-05-29 DIAGNOSIS — D485 Neoplasm of uncertain behavior of skin: Secondary | ICD-10-CM | POA: Diagnosis not present

## 2023-05-29 DIAGNOSIS — D225 Melanocytic nevi of trunk: Secondary | ICD-10-CM | POA: Diagnosis not present

## 2023-05-29 DIAGNOSIS — C44719 Basal cell carcinoma of skin of left lower limb, including hip: Secondary | ICD-10-CM | POA: Diagnosis not present

## 2023-05-29 DIAGNOSIS — D2272 Melanocytic nevi of left lower limb, including hip: Secondary | ICD-10-CM | POA: Diagnosis not present

## 2023-05-29 DIAGNOSIS — Z85828 Personal history of other malignant neoplasm of skin: Secondary | ICD-10-CM | POA: Diagnosis not present

## 2023-05-30 DIAGNOSIS — I442 Atrioventricular block, complete: Secondary | ICD-10-CM | POA: Diagnosis not present

## 2023-06-07 ENCOUNTER — Other Ambulatory Visit: Payer: Self-pay | Admitting: Internal Medicine

## 2023-06-07 DIAGNOSIS — Z1231 Encounter for screening mammogram for malignant neoplasm of breast: Secondary | ICD-10-CM

## 2023-06-22 ENCOUNTER — Ambulatory Visit
Admission: RE | Admit: 2023-06-22 | Discharge: 2023-06-22 | Disposition: A | Discharge status: Home / Self Care | Source: Ambulatory Visit | Attending: Internal Medicine | Admitting: Internal Medicine

## 2023-06-22 DIAGNOSIS — Z1231 Encounter for screening mammogram for malignant neoplasm of breast: Secondary | ICD-10-CM | POA: Insufficient documentation

## 2023-07-02 ENCOUNTER — Other Ambulatory Visit (INDEPENDENT_AMBULATORY_CARE_PROVIDER_SITE_OTHER): Payer: Self-pay | Admitting: Nurse Practitioner

## 2023-07-02 DIAGNOSIS — I5032 Chronic diastolic (congestive) heart failure: Secondary | ICD-10-CM

## 2023-07-02 DIAGNOSIS — M7989 Other specified soft tissue disorders: Secondary | ICD-10-CM

## 2023-07-03 ENCOUNTER — Encounter (INDEPENDENT_AMBULATORY_CARE_PROVIDER_SITE_OTHER): Payer: Self-pay | Admitting: Vascular Surgery

## 2023-07-03 ENCOUNTER — Ambulatory Visit (INDEPENDENT_AMBULATORY_CARE_PROVIDER_SITE_OTHER): Payer: Medicare PPO | Admitting: Vascular Surgery

## 2023-07-03 ENCOUNTER — Ambulatory Visit (INDEPENDENT_AMBULATORY_CARE_PROVIDER_SITE_OTHER): Payer: Medicare PPO

## 2023-07-03 VITALS — BP 129/71 | HR 61 | Resp 18 | Wt 191.6 lb

## 2023-07-03 DIAGNOSIS — M7989 Other specified soft tissue disorders: Secondary | ICD-10-CM | POA: Insufficient documentation

## 2023-07-03 DIAGNOSIS — I5032 Chronic diastolic (congestive) heart failure: Secondary | ICD-10-CM | POA: Diagnosis not present

## 2023-07-03 DIAGNOSIS — I83891 Varicose veins of right lower extremities with other complications: Secondary | ICD-10-CM

## 2023-07-03 DIAGNOSIS — I10 Essential (primary) hypertension: Secondary | ICD-10-CM

## 2023-07-03 NOTE — Assessment & Plan Note (Signed)
 blood pressure control important in reducing the progression of atherosclerotic disease. On appropriate oral medications.

## 2023-07-03 NOTE — Assessment & Plan Note (Signed)
 Cardiac issues can certainly worsen lower extremity swelling.

## 2023-07-03 NOTE — Progress Notes (Unsigned)
 MRN : [id number]  Brandy Golden is a 78 y.o. (January 26, 1946) female who presents with chief complaint of No chief complaint on file. Marland Kitchen  History of Present Illness: Patient returns today in follow up of ***  Current Outpatient Medications  Medication Sig Dispense Refill   ALPRAZolam (XANAX) 0.5 MG tablet Take 0.5 mg by mouth 2 (two) times daily as needed for anxiety or sleep.     Biotin 5 MG CAPS Take 5 mg by mouth 2 (two) times daily.     Ca Carbonate-Mag Hydroxide (ROLAIDS PO) Take 2 each by mouth daily as needed (for acid reflux).     Calcium Carb-Cholecalciferol (CALCIUM + VITAMIN D3 PO) Take 2 tablets by mouth 2 (two) times daily.     Cholecalciferol (VITAMIN D3) 1000 units CAPS Take 1,000 Units by mouth daily.     colestipol (COLESTID) 1 g tablet TAKE 3 TABLETS BY MOUTH DAILY 270 tablet 1   ELIQUIS 5 MG TABS tablet Take 5 mg by mouth 2 (two) times daily.     fluticasone (FLONASE) 50 MCG/ACT nasal spray TAKE 2 SPRAYS INTO EACH NOSTRIL AT BEDTIME     hydrocortisone (ANUSOL-HC) 2.5 % rectal cream Place 1 Application rectally 2 (two) times daily. (Patient not taking: Reported on 05/07/2023) 30 g 2   Multiple Vitamin (MULTI-VITAMIN DAILY PO) Take 1 tablet by mouth daily.     omeprazole (PRILOSEC) 40 MG capsule Take 1 capsule (40 mg total) by mouth 2 (two) times daily before a meal. 180 capsule 3   sertraline (ZOLOFT) 100 MG tablet Take 100 mg by mouth daily.     sodium fluoride (FLUORISHIELD) 1.1 % GEL dental gel Place onto teeth. (Patient not taking: Reported on 05/07/2023)     spironolactone (ALDACTONE) 25 MG tablet Take 25 mg by mouth daily.     sucralfate (CARAFATE) 1 g tablet Take 1 tablet (1 g total) by mouth 4 (four) times daily -  with meals and at bedtime. (Patient not taking: Reported on 05/07/2023) 120 tablet 0   SYNTHROID 125 MCG tablet Take 125 mcg by mouth daily before breakfast.  (Patient not taking: Reported on 05/07/2023)     torsemide (DEMADEX) 20 MG tablet Take 20 mg by mouth  daily.     vitamin C (ASCORBIC ACID) 500 MG tablet Take 1,000 mg by mouth daily.      zinc gluconate 50 MG tablet Take 50 mg by mouth daily. (Patient not taking: Reported on 05/07/2023)     No current facility-administered medications for this visit.    Past Medical History:  Diagnosis Date   Allergy    Anemia, iron deficiency    Anxiety    Atrial fibrillation (HCC) 01/2021   Benign essential tremor    Blood transfusion without reported diagnosis    Cancer (HCC)    bacal cell nd squmous cell skin cancers   Cataract    Chronic kidney disease    Colon polyps    Depression    Diverticulosis    Dysphonia    Dysrhythmia    Fibrocystic Breast disease    pt denies   Gastric ulcer 2006   Gastrointestinal hemorrhage, hx of    GERD (gastroesophageal reflux disease)    History of COVID-19    History of hiatal hernia    Hx of basal cell carcinoma    and squamous cell skin cancer   Hyperlipidemia    Hypertension    Hypothyroidism    IBS (irritable bowel syndrome)  Internal hemorrhoids without mention of complication    LBBB (left bundle branch block)    Osteoarthritis    Overactive bladder    Stroke (HCC) 03/2018   Unspecified hypothyroidism    Vitamin D deficiency     Past Surgical History:  Procedure Laterality Date   ABDOMINAL HYSTERECTOMY  1984   CATARACT EXTRACTION W/PHACO Left 12/04/2017   Procedure: CATARACT EXTRACTION PHACO AND INTRAOCULAR LENS PLACEMENT (IOC);  Surgeon: Galen Manila, MD;  Location: [facility];  Service: Ophthalmology;  Laterality: Left;  [place] 01: 13.5 AP% 18.8 CDE 13.85 Fluid Pack lot # [id number]   CATARACT EXTRACTION W/PHACO Right 12/25/2017   Procedure: CATARACT EXTRACTION PHACO AND INTRAOCULAR LENS PLACEMENT (IOC);  Surgeon: Galen Manila, MD;  Location: [facility];  Service: Ophthalmology;  Laterality: Right;  [place]  00:35 AP% 17.5 CDE 6.23 Fluid pack lot # [id number]   COLONOSCOPY     FOOT SURGERY     HERNIA REPAIR  2011   Laparoscopic  repair of large hiatal hernia/nissen  10/2009   Dr. Luretha Murphy   laryngeal nerve medialization and reconstruction  07/13/2016   Left foot surgery     NASAL SINUS SURGERY     NISSEN FUNDOPLICATION     PACEMAKER LEADLESS INSERTION N/A 03/07/2019   Procedure: PACEMAKER LEADLESS INSERTION;  Surgeon: Marcina Millard, MD;  Location: [facility];  Service: Cardiovascular;  Laterality: N/A;   rhinoplasty with submocosal resection     Submucosal resection     TEMPORARY PACEMAKER N/A 03/07/2019   Procedure: TEMPORARY PACEMAKER;  Surgeon: Marcina Millard, MD;  Location: [facility];  Service: Cardiovascular;  Laterality: N/A;   THYROIDECTOMY  2004   tracheal /paraesophageal mass surgery  08/2013   cartilage surgery and gortex build up of cords   UPPER GASTROINTESTINAL ENDOSCOPY       Social History   Tobacco Use   Smoking status: Never   Smokeless tobacco: Never  Vaping Use   Vaping status: Never Used  Substance Use Topics   Alcohol use: Yes    Comment: Occassional wine   Drug use: No   ***    Family History  Problem Relation Age of Onset   Diabetes Father        2 aunts, 1 cousin   CVA Father    Heart disease Mother    Breast cancer Sister    Colitis Cousin    Breast cancer Cousin    Breast cancer Maternal Aunt    Colon cancer Neg Hx    Esophageal cancer Neg Hx    Rectal cancer Neg Hx    Stomach cancer Neg Hx    ***  Allergies  Allergen Reactions   Amoxicillin-Pot Clavulanate Other (See Comments)   Other     MYCINS   Sulfamethoxazole-Trimethoprim Other (See Comments)   Macrolides And Ketolides Rash   Niacin Rash     REVIEW OF SYSTEMS (Negative unless checked)  Constitutional: [] Weight loss  [] Fever  [] Chills Cardiac: [] Chest pain   [] Chest pressure   [] Palpitations   [] Shortness of breath when laying flat   [] Shortness of breath at rest   [] Shortness of breath with exertion. Vascular:  [] Pain in legs with walking   [] Pain in legs  at rest   [] Pain in legs when laying flat   [] Claudication   [] Pain in feet when walking  [] Pain in feet at rest  [] Pain in feet when laying flat   [] History of DVT   [] Phlebitis   [] Swelling in  legs   [] Varicose veins   [] Non-healing ulcers Pulmonary:   [] Uses home oxygen   [] Productive cough   [] Hemoptysis   [] Wheeze  [] COPD   [] Asthma Neurologic:  [] Dizziness  [] Blackouts   [] Seizures   [] History of stroke   [] History of TIA  [] Aphasia   [] Temporary blindness   [] Dysphagia   [] Weakness or numbness in arms   [] Weakness or numbness in legs Musculoskeletal:  [] Arthritis   [] Joint swelling   [] Joint pain   [] Low back pain Hematologic:  [] Easy bruising  [] Easy bleeding   [] Hypercoagulable state   [] Anemic   Gastrointestinal:  [] Blood in stool   [] Vomiting blood  [] Gastroesophageal reflux/heartburn   [] Abdominal pain Genitourinary:  [] Chronic kidney disease   [] Difficult urination  [] Frequent urination  [] Burning with urination   [] Hematuria Skin:  [] Rashes   [] Ulcers   [] Wounds Psychological:  [] History of anxiety   []  History of major depression.  Physical Examination  There were no vitals taken for this visit. Gen:  WD/WN, NAD Head: /AT, No temporalis wasting. Ear/Nose/Throat: Hearing grossly intact, nares w/o erythema or drainage Eyes: Conjunctiva clear. Sclera non-icteric Neck: Supple.  Trachea midline Pulmonary:  Good air movement, no use of accessory muscles.  Cardiac: RRR, no JVD Vascular: *** Vessel Right Left  Radial Palpable Palpable                          PT Palpable Palpable  DP Palpable Palpable   Gastrointestinal: soft, non-tender/non-distended. No guarding/reflex.  Musculoskeletal: M/S 5/5 throughout.  No deformity or atrophy. *** edema. Neurologic: Sensation grossly intact in extremities.  Symmetrical.  Speech is fluent.  Psychiatric: Judgment intact, Mood & affect appropriate for pt's clinical situation. Dermatologic: No rashes or ulcers noted.  No cellulitis or  open wounds.      Labs No results found for this or any previous visit (from the past 2160 hours).  Radiology MM 3D SCREENING MAMMOGRAM BILATERAL BREAST Result Date: 06/26/2023 CLINICAL DATA:  Screening. EXAM: DIGITAL SCREENING BILATERAL MAMMOGRAM WITH TOMOSYNTHESIS AND CAD TECHNIQUE: Bilateral screening digital craniocaudal and mediolateral oblique mammograms were obtained. Bilateral screening digital breast tomosynthesis was performed. The images were evaluated with computer-aided detection. COMPARISON:  Previous exam(s). ACR Breast Density Category c: The breasts are heterogeneously dense, which may obscure small masses. FINDINGS: There are no findings suspicious for malignancy. IMPRESSION: No mammographic evidence of malignancy. A result letter of this screening mammogram will be mailed directly to the patient. RECOMMENDATION: Screening mammogram in one year. (Code:SM-B-01Y) BI-RADS CATEGORY  1: Negative. Electronically Signed   By: Edwin Cap M.D.   On: 06/26/2023 07:48    Assessment/Plan  No problem-specific Assessment & Plan notes found for this encounter.    Festus Barren, MD  07/03/2023 3:03 PM    This note was created with Dragon medical transcription system.  Any errors from dictation are purely unintentional

## 2023-07-04 DIAGNOSIS — I83891 Varicose veins of right lower extremities with other complications: Secondary | ICD-10-CM | POA: Insufficient documentation

## 2023-07-04 NOTE — Assessment & Plan Note (Signed)
 A venous reflux study was performed today.  This demonstrated no evidence of DVT or superficial thrombophlebitis.  No significant venous reflux was seen on the left.  The right great saphenous vein did have reflux. Her symptoms are reasonably stable with conservative therapies.  At this point, we discussed consideration for invasive therapy in the form of laser ablation of the right great saphenous vein versus continued conservative therapy.  She would prefer conservative therapy which is very reasonable.  If her symptoms worsen she will contact our office, otherwise I will see her back in 6 months.

## 2023-07-04 NOTE — Assessment & Plan Note (Signed)
 Swelling has improved some with diuretics and compression socks, particularly on the left.  Does have venous reflux on the right.  Discussed continued conservative measures or consideration for laser ablation.  At this point, she would like to continue compression socks which I think is very reasonable.  I will plan to see her back in 6 months.

## 2023-07-23 DIAGNOSIS — N1831 Chronic kidney disease, stage 3a: Secondary | ICD-10-CM | POA: Diagnosis not present

## 2023-07-23 DIAGNOSIS — I10 Essential (primary) hypertension: Secondary | ICD-10-CM | POA: Diagnosis not present

## 2023-07-23 DIAGNOSIS — R0602 Shortness of breath: Secondary | ICD-10-CM | POA: Diagnosis not present

## 2023-07-23 DIAGNOSIS — I4510 Unspecified right bundle-branch block: Secondary | ICD-10-CM | POA: Diagnosis not present

## 2023-07-23 DIAGNOSIS — R002 Palpitations: Secondary | ICD-10-CM | POA: Diagnosis not present

## 2023-07-23 DIAGNOSIS — E7800 Pure hypercholesterolemia, unspecified: Secondary | ICD-10-CM | POA: Diagnosis not present

## 2023-07-31 DIAGNOSIS — C44719 Basal cell carcinoma of skin of left lower limb, including hip: Secondary | ICD-10-CM | POA: Diagnosis not present

## 2023-08-21 ENCOUNTER — Encounter (INDEPENDENT_AMBULATORY_CARE_PROVIDER_SITE_OTHER): Payer: Self-pay

## 2023-09-19 ENCOUNTER — Other Ambulatory Visit: Payer: Self-pay | Admitting: Internal Medicine

## 2023-10-25 DIAGNOSIS — I130 Hypertensive heart and chronic kidney disease with heart failure and stage 1 through stage 4 chronic kidney disease, or unspecified chronic kidney disease: Secondary | ICD-10-CM | POA: Diagnosis not present

## 2023-10-25 DIAGNOSIS — E039 Hypothyroidism, unspecified: Secondary | ICD-10-CM | POA: Diagnosis not present

## 2023-10-25 DIAGNOSIS — I5032 Chronic diastolic (congestive) heart failure: Secondary | ICD-10-CM | POA: Diagnosis not present

## 2023-10-25 DIAGNOSIS — N1831 Chronic kidney disease, stage 3a: Secondary | ICD-10-CM | POA: Diagnosis not present

## 2023-10-25 DIAGNOSIS — R739 Hyperglycemia, unspecified: Secondary | ICD-10-CM | POA: Diagnosis not present

## 2023-10-25 DIAGNOSIS — E559 Vitamin D deficiency, unspecified: Secondary | ICD-10-CM | POA: Diagnosis not present

## 2023-10-25 DIAGNOSIS — E785 Hyperlipidemia, unspecified: Secondary | ICD-10-CM | POA: Diagnosis not present

## 2023-10-25 DIAGNOSIS — D509 Iron deficiency anemia, unspecified: Secondary | ICD-10-CM | POA: Diagnosis not present

## 2023-10-25 DIAGNOSIS — K219 Gastro-esophageal reflux disease without esophagitis: Secondary | ICD-10-CM | POA: Diagnosis not present

## 2023-11-01 DIAGNOSIS — I4891 Unspecified atrial fibrillation: Secondary | ICD-10-CM | POA: Diagnosis not present

## 2023-11-01 DIAGNOSIS — F3341 Major depressive disorder, recurrent, in partial remission: Secondary | ICD-10-CM | POA: Diagnosis not present

## 2023-11-01 DIAGNOSIS — R82998 Other abnormal findings in urine: Secondary | ICD-10-CM | POA: Diagnosis not present

## 2023-11-01 DIAGNOSIS — I10 Essential (primary) hypertension: Secondary | ICD-10-CM | POA: Diagnosis not present

## 2023-11-01 DIAGNOSIS — I5032 Chronic diastolic (congestive) heart failure: Secondary | ICD-10-CM | POA: Diagnosis not present

## 2023-11-01 DIAGNOSIS — N1831 Chronic kidney disease, stage 3a: Secondary | ICD-10-CM | POA: Diagnosis not present

## 2023-11-01 DIAGNOSIS — N390 Urinary tract infection, site not specified: Secondary | ICD-10-CM | POA: Diagnosis not present

## 2023-11-01 DIAGNOSIS — R0600 Dyspnea, unspecified: Secondary | ICD-10-CM | POA: Diagnosis not present

## 2023-11-01 DIAGNOSIS — Z0000 Encounter for general adult medical examination without abnormal findings: Secondary | ICD-10-CM | POA: Diagnosis not present

## 2023-11-01 DIAGNOSIS — I129 Hypertensive chronic kidney disease with stage 1 through stage 4 chronic kidney disease, or unspecified chronic kidney disease: Secondary | ICD-10-CM | POA: Diagnosis not present

## 2023-11-06 DIAGNOSIS — I442 Atrioventricular block, complete: Secondary | ICD-10-CM | POA: Diagnosis not present

## 2023-11-08 DIAGNOSIS — R8271 Bacteriuria: Secondary | ICD-10-CM | POA: Diagnosis not present

## 2023-11-08 DIAGNOSIS — N3020 Other chronic cystitis without hematuria: Secondary | ICD-10-CM | POA: Diagnosis not present

## 2023-11-08 DIAGNOSIS — N3946 Mixed incontinence: Secondary | ICD-10-CM | POA: Diagnosis not present

## 2023-11-08 DIAGNOSIS — N3000 Acute cystitis without hematuria: Secondary | ICD-10-CM | POA: Diagnosis not present

## 2023-11-14 DIAGNOSIS — N3020 Other chronic cystitis without hematuria: Secondary | ICD-10-CM | POA: Diagnosis not present

## 2023-11-14 DIAGNOSIS — R351 Nocturia: Secondary | ICD-10-CM | POA: Diagnosis not present

## 2023-11-14 DIAGNOSIS — N3946 Mixed incontinence: Secondary | ICD-10-CM | POA: Diagnosis not present

## 2023-11-25 DIAGNOSIS — D6832 Hemorrhagic disorder due to extrinsic circulating anticoagulants: Secondary | ICD-10-CM | POA: Diagnosis not present

## 2023-11-25 DIAGNOSIS — M1712 Unilateral primary osteoarthritis, left knee: Secondary | ICD-10-CM | POA: Diagnosis not present

## 2023-11-26 DIAGNOSIS — R0602 Shortness of breath: Secondary | ICD-10-CM | POA: Diagnosis not present

## 2023-11-26 DIAGNOSIS — K449 Diaphragmatic hernia without obstruction or gangrene: Secondary | ICD-10-CM | POA: Diagnosis not present

## 2023-11-26 DIAGNOSIS — K219 Gastro-esophageal reflux disease without esophagitis: Secondary | ICD-10-CM | POA: Diagnosis not present

## 2023-12-04 DIAGNOSIS — D2272 Melanocytic nevi of left lower limb, including hip: Secondary | ICD-10-CM | POA: Diagnosis not present

## 2023-12-04 DIAGNOSIS — Z08 Encounter for follow-up examination after completed treatment for malignant neoplasm: Secondary | ICD-10-CM | POA: Diagnosis not present

## 2023-12-04 DIAGNOSIS — L821 Other seborrheic keratosis: Secondary | ICD-10-CM | POA: Diagnosis not present

## 2023-12-04 DIAGNOSIS — D2262 Melanocytic nevi of left upper limb, including shoulder: Secondary | ICD-10-CM | POA: Diagnosis not present

## 2023-12-04 DIAGNOSIS — Z85828 Personal history of other malignant neoplasm of skin: Secondary | ICD-10-CM | POA: Diagnosis not present

## 2023-12-04 DIAGNOSIS — L570 Actinic keratosis: Secondary | ICD-10-CM | POA: Diagnosis not present

## 2023-12-04 DIAGNOSIS — D2261 Melanocytic nevi of right upper limb, including shoulder: Secondary | ICD-10-CM | POA: Diagnosis not present

## 2023-12-04 DIAGNOSIS — D225 Melanocytic nevi of trunk: Secondary | ICD-10-CM | POA: Diagnosis not present

## 2023-12-04 DIAGNOSIS — D2271 Melanocytic nevi of right lower limb, including hip: Secondary | ICD-10-CM | POA: Diagnosis not present

## 2023-12-18 DIAGNOSIS — R609 Edema, unspecified: Secondary | ICD-10-CM | POA: Diagnosis not present

## 2023-12-18 DIAGNOSIS — I4510 Unspecified right bundle-branch block: Secondary | ICD-10-CM | POA: Diagnosis not present

## 2023-12-18 DIAGNOSIS — R0602 Shortness of breath: Secondary | ICD-10-CM | POA: Diagnosis not present

## 2023-12-18 DIAGNOSIS — I10 Essential (primary) hypertension: Secondary | ICD-10-CM | POA: Diagnosis not present

## 2023-12-18 DIAGNOSIS — Z950 Presence of cardiac pacemaker: Secondary | ICD-10-CM | POA: Diagnosis not present

## 2023-12-18 DIAGNOSIS — I482 Chronic atrial fibrillation, unspecified: Secondary | ICD-10-CM | POA: Diagnosis not present

## 2023-12-18 DIAGNOSIS — I480 Paroxysmal atrial fibrillation: Secondary | ICD-10-CM | POA: Diagnosis not present

## 2023-12-18 DIAGNOSIS — R002 Palpitations: Secondary | ICD-10-CM | POA: Diagnosis not present

## 2023-12-19 DIAGNOSIS — H02006 Unspecified entropion of left eye, unspecified eyelid: Secondary | ICD-10-CM | POA: Diagnosis not present

## 2023-12-19 DIAGNOSIS — I480 Paroxysmal atrial fibrillation: Secondary | ICD-10-CM | POA: Diagnosis not present

## 2023-12-19 DIAGNOSIS — H02055 Trichiasis without entropian left lower eyelid: Secondary | ICD-10-CM | POA: Diagnosis not present

## 2023-12-19 DIAGNOSIS — S0502XA Injury of conjunctiva and corneal abrasion without foreign body, left eye, initial encounter: Secondary | ICD-10-CM | POA: Diagnosis not present

## 2023-12-21 DIAGNOSIS — H02055 Trichiasis without entropian left lower eyelid: Secondary | ICD-10-CM | POA: Diagnosis not present

## 2023-12-21 DIAGNOSIS — S0502XA Injury of conjunctiva and corneal abrasion without foreign body, left eye, initial encounter: Secondary | ICD-10-CM | POA: Diagnosis not present

## 2023-12-21 DIAGNOSIS — H02006 Unspecified entropion of left eye, unspecified eyelid: Secondary | ICD-10-CM | POA: Diagnosis not present

## 2023-12-26 DIAGNOSIS — I130 Hypertensive heart and chronic kidney disease with heart failure and stage 1 through stage 4 chronic kidney disease, or unspecified chronic kidney disease: Secondary | ICD-10-CM | POA: Diagnosis not present

## 2023-12-26 DIAGNOSIS — R5382 Chronic fatigue, unspecified: Secondary | ICD-10-CM | POA: Diagnosis not present

## 2023-12-26 DIAGNOSIS — I5032 Chronic diastolic (congestive) heart failure: Secondary | ICD-10-CM | POA: Diagnosis not present

## 2023-12-26 DIAGNOSIS — D509 Iron deficiency anemia, unspecified: Secondary | ICD-10-CM | POA: Diagnosis not present

## 2023-12-26 DIAGNOSIS — N1831 Chronic kidney disease, stage 3a: Secondary | ICD-10-CM | POA: Diagnosis not present

## 2024-01-01 ENCOUNTER — Ambulatory Visit (INDEPENDENT_AMBULATORY_CARE_PROVIDER_SITE_OTHER): Admitting: Vascular Surgery

## 2024-01-03 DIAGNOSIS — R002 Palpitations: Secondary | ICD-10-CM | POA: Diagnosis not present

## 2024-01-03 DIAGNOSIS — H02006 Unspecified entropion of left eye, unspecified eyelid: Secondary | ICD-10-CM | POA: Diagnosis not present

## 2024-01-03 DIAGNOSIS — H02055 Trichiasis without entropian left lower eyelid: Secondary | ICD-10-CM | POA: Diagnosis not present

## 2024-01-07 DIAGNOSIS — R82998 Other abnormal findings in urine: Secondary | ICD-10-CM | POA: Diagnosis not present

## 2024-01-07 DIAGNOSIS — D509 Iron deficiency anemia, unspecified: Secondary | ICD-10-CM | POA: Diagnosis not present

## 2024-01-08 DIAGNOSIS — M25562 Pain in left knee: Secondary | ICD-10-CM | POA: Diagnosis not present

## 2024-01-08 DIAGNOSIS — M1712 Unilateral primary osteoarthritis, left knee: Secondary | ICD-10-CM | POA: Diagnosis not present

## 2024-01-10 DIAGNOSIS — H02055 Trichiasis without entropian left lower eyelid: Secondary | ICD-10-CM | POA: Diagnosis not present

## 2024-01-15 DIAGNOSIS — I4510 Unspecified right bundle-branch block: Secondary | ICD-10-CM | POA: Diagnosis not present

## 2024-01-15 DIAGNOSIS — R002 Palpitations: Secondary | ICD-10-CM | POA: Diagnosis not present

## 2024-01-15 DIAGNOSIS — I10 Essential (primary) hypertension: Secondary | ICD-10-CM | POA: Diagnosis not present

## 2024-01-15 DIAGNOSIS — Z950 Presence of cardiac pacemaker: Secondary | ICD-10-CM | POA: Diagnosis not present

## 2024-01-15 DIAGNOSIS — R0602 Shortness of breath: Secondary | ICD-10-CM | POA: Diagnosis not present

## 2024-01-15 DIAGNOSIS — I5032 Chronic diastolic (congestive) heart failure: Secondary | ICD-10-CM | POA: Diagnosis not present

## 2024-01-28 DIAGNOSIS — R32 Unspecified urinary incontinence: Secondary | ICD-10-CM | POA: Diagnosis not present

## 2024-01-28 DIAGNOSIS — G629 Polyneuropathy, unspecified: Secondary | ICD-10-CM | POA: Diagnosis not present

## 2024-01-28 DIAGNOSIS — D6869 Other thrombophilia: Secondary | ICD-10-CM | POA: Diagnosis not present

## 2024-01-28 DIAGNOSIS — Z7901 Long term (current) use of anticoagulants: Secondary | ICD-10-CM | POA: Diagnosis not present

## 2024-01-28 DIAGNOSIS — F325 Major depressive disorder, single episode, in full remission: Secondary | ICD-10-CM | POA: Diagnosis not present

## 2024-01-28 DIAGNOSIS — I4891 Unspecified atrial fibrillation: Secondary | ICD-10-CM | POA: Diagnosis not present

## 2024-01-28 DIAGNOSIS — I442 Atrioventricular block, complete: Secondary | ICD-10-CM | POA: Diagnosis not present

## 2024-01-28 DIAGNOSIS — K219 Gastro-esophageal reflux disease without esophagitis: Secondary | ICD-10-CM | POA: Diagnosis not present

## 2024-01-28 DIAGNOSIS — E039 Hypothyroidism, unspecified: Secondary | ICD-10-CM | POA: Diagnosis not present

## 2024-02-07 DIAGNOSIS — M25562 Pain in left knee: Secondary | ICD-10-CM | POA: Diagnosis not present

## 2024-02-07 DIAGNOSIS — G8929 Other chronic pain: Secondary | ICD-10-CM | POA: Diagnosis not present

## 2024-02-13 ENCOUNTER — Telehealth: Payer: Self-pay | Admitting: Internal Medicine

## 2024-02-13 NOTE — Telephone Encounter (Signed)
 PT is calling to schedule an appointment with Dr. Albertus and is very upset that she cannot be seen by him. She would like to discuss her referral for Heme positive stool  and have him fit her in. Please advise.

## 2024-02-13 NOTE — Telephone Encounter (Signed)
 She has large HH, and so would be at risk for Cameron's lesions or ulcers. Would continue the twice daily PPI that we CBC Could do EGD here on 11/19 but need to see blood counts 1st

## 2024-02-13 NOTE — Telephone Encounter (Signed)
 Patient calls complaining of heme positive stool (for which she says she was referred in October by Dr Onita) as well as several episodes of vomiting black material. She states that last week she had 2 episodes of projectile vomiting of black material soon after waking while she was camping. She denies any pain that was associated with this. States that she also had episode of vomiting black material yesterday though not a large amount. She says that she has not vomited since. She did take some tums and pepto bismol for what she describes as chest discomfort. Patient says she had black tarry stool recently but prior to using pepto. States that her most recent stool was dark brown. Again, denies any abdominal pain or chest pain currently. No bowel changes.   Patient does have history of large hiatal hernia with undone Nissen fundoplication; prior GI bleed, has internal hemorrhoids and history of IDA.  Unfortunately, the first office availability to see patient is with Ellouise Console, PA-C on 02/27/24 (for which I have tentatively placed patient).  She is advised that if she has ANY additional episodes of vomiting black material, black tarry stool, chest pain, abdominal pain, she should go to the emergency room for immediate care.

## 2024-02-14 ENCOUNTER — Other Ambulatory Visit: Payer: Self-pay

## 2024-02-14 ENCOUNTER — Ambulatory Visit: Payer: Self-pay | Admitting: Internal Medicine

## 2024-02-14 ENCOUNTER — Other Ambulatory Visit (INDEPENDENT_AMBULATORY_CARE_PROVIDER_SITE_OTHER)

## 2024-02-14 ENCOUNTER — Other Ambulatory Visit: Payer: Self-pay | Admitting: *Deleted

## 2024-02-14 ENCOUNTER — Ambulatory Visit

## 2024-02-14 VITALS — Ht 70.0 in | Wt 192.0 lb

## 2024-02-14 DIAGNOSIS — R195 Other fecal abnormalities: Secondary | ICD-10-CM | POA: Diagnosis not present

## 2024-02-14 DIAGNOSIS — K219 Gastro-esophageal reflux disease without esophagitis: Secondary | ICD-10-CM

## 2024-02-14 DIAGNOSIS — R112 Nausea with vomiting, unspecified: Secondary | ICD-10-CM

## 2024-02-14 DIAGNOSIS — K449 Diaphragmatic hernia without obstruction or gangrene: Secondary | ICD-10-CM

## 2024-02-14 LAB — CBC WITH DIFFERENTIAL/PLATELET
Basophils Absolute: 0 K/uL (ref 0.0–0.1)
Basophils Relative: 0.3 % (ref 0.0–3.0)
Eosinophils Absolute: 0.1 K/uL (ref 0.0–0.7)
Eosinophils Relative: 0.8 % (ref 0.0–5.0)
HCT: 33.3 % — ABNORMAL LOW (ref 36.0–46.0)
Hemoglobin: 10.7 g/dL — ABNORMAL LOW (ref 12.0–15.0)
Lymphocytes Relative: 19.4 % (ref 12.0–46.0)
Lymphs Abs: 1.5 K/uL (ref 0.7–4.0)
MCHC: 32.1 g/dL (ref 30.0–36.0)
MCV: 76.9 fl — ABNORMAL LOW (ref 78.0–100.0)
Monocytes Absolute: 0.6 K/uL (ref 0.1–1.0)
Monocytes Relative: 7.7 % (ref 3.0–12.0)
Neutro Abs: 5.7 K/uL (ref 1.4–7.7)
Neutrophils Relative %: 71.8 % (ref 43.0–77.0)
Platelets: 167 K/uL (ref 150.0–400.0)
RBC: 4.33 Mil/uL (ref 3.87–5.11)
RDW: 17.2 % — ABNORMAL HIGH (ref 11.5–15.5)
WBC: 7.9 K/uL (ref 4.0–10.5)

## 2024-02-14 MED ORDER — FERROUS SULFATE 325 (65 FE) MG PO TABS: 325.0000 mg | ORAL_TABLET | Freq: Every day | ORAL | 3 refills | Status: AC

## 2024-02-14 NOTE — Telephone Encounter (Signed)
 I have spoken to patient to advise of Dr Pamula recommendation that she come for CBC today and possible endoscopy on 02/20/24 pending stable blood counts. Patient is in agreement and says she will come for labs today.  We have tentatively scheduled endoscopy for 02/20/24 at 1030 am and she has scheduled a previsit for today, 02/14/24 at 1030 am while she is here for labs.   Patient is on Eliquis so a note has been sent to Dr Albertus regarding clarification of whether he would perform procedure ON eliquis or if clearance is needed to hold. I will follow up with the patient as appropriate in regards to this.

## 2024-02-14 NOTE — Telephone Encounter (Signed)
 Dr Albertus states that if we are able to get clearance to hold Eliquis from Dr Ammon at [facility] (prescriber), he would like her to hold the medication. However, if we are unable to get clearance, he will perform the procedure ON Eliquis.  Request for anticoagulation clearance has been routed electronically to Dr Ammon.

## 2024-02-14 NOTE — Progress Notes (Signed)
 No egg or soy allergy known to patient  No issues known to pt with past sedation with any surgeries or procedures Patient denies ever being told they had issues or difficulty with intubation  No FH of Malignant Hyperthermia Pt is not on diet pills Pt is not on  home 02  Pt is on blood thinners takes Eliquis Pt denies issues with constipation  Has A fib and does have a pacemaker Have any cardiac testing pending--No Pt can ambulate , uses walker , left leg mobility problems Pt denies use of chewing tobacco Discussed diabetic I weight loss medication holds Discussed NSAID holds Checked BMI Pt instructed to use [employer] or GoodRx for a price reduction on prep  Patient's chart reviewed  Pre visit completed,  Urgent add on per Dr. Albertus

## 2024-02-15 ENCOUNTER — Encounter: Payer: Self-pay | Admitting: Internal Medicine

## 2024-02-15 DIAGNOSIS — M25562 Pain in left knee: Secondary | ICD-10-CM | POA: Diagnosis not present

## 2024-02-15 DIAGNOSIS — G8929 Other chronic pain: Secondary | ICD-10-CM | POA: Diagnosis not present

## 2024-02-15 NOTE — Telephone Encounter (Signed)
 I have spoken to Dr Albertus to advise that we have not yet received a response regarding patient eliquis hold from cardiology. Per Dr Albertus we can continue to try to get clearance from cardiology for 24 hour hold. However, even if they do not respond, patient may hold eliquis 24 hours prior to her test with last dose being Tuesday morning.  I have advised patient of this information and she verbalizes understanding.

## 2024-02-15 NOTE — Telephone Encounter (Signed)
 Spoke with Levon at Dr Ammon office (phone [phone]) requesting urgent response regarding holding Eliquis 48 hours prior to emergent endoscopy dated 02/20/24. She states she will send a note to the nurse requesting a written response.

## 2024-02-18 ENCOUNTER — Ambulatory Visit: Payer: Self-pay | Admitting: Internal Medicine

## 2024-02-18 ENCOUNTER — Telehealth: Payer: Self-pay | Admitting: Internal Medicine

## 2024-02-18 ENCOUNTER — Other Ambulatory Visit (INDEPENDENT_AMBULATORY_CARE_PROVIDER_SITE_OTHER)

## 2024-02-18 DIAGNOSIS — K219 Gastro-esophageal reflux disease without esophagitis: Secondary | ICD-10-CM | POA: Diagnosis not present

## 2024-02-18 DIAGNOSIS — R195 Other fecal abnormalities: Secondary | ICD-10-CM

## 2024-02-18 DIAGNOSIS — R112 Nausea with vomiting, unspecified: Secondary | ICD-10-CM

## 2024-02-18 LAB — CBC WITH DIFFERENTIAL/PLATELET
Basophils Absolute: 0 K/uL (ref 0.0–0.1)
Basophils Relative: 0.2 % (ref 0.0–3.0)
Eosinophils Absolute: 0.1 K/uL (ref 0.0–0.7)
Eosinophils Relative: 0.9 % (ref 0.0–5.0)
HCT: 34.5 % — ABNORMAL LOW (ref 36.0–46.0)
Hemoglobin: 11.2 g/dL — ABNORMAL LOW (ref 12.0–15.0)
Lymphocytes Relative: 18.3 % (ref 12.0–46.0)
Lymphs Abs: 1.4 K/uL (ref 0.7–4.0)
MCHC: 32.3 g/dL (ref 30.0–36.0)
MCV: 77.8 fl — ABNORMAL LOW (ref 78.0–100.0)
Monocytes Absolute: 0.4 K/uL (ref 0.1–1.0)
Monocytes Relative: 5.3 % (ref 3.0–12.0)
Neutro Abs: 5.6 K/uL (ref 1.4–7.7)
Neutrophils Relative %: 75.3 % (ref 43.0–77.0)
Platelets: 172 K/uL (ref 150.0–400.0)
RBC: 4.44 Mil/uL (ref 3.87–5.11)
RDW: 17.3 % — ABNORMAL HIGH (ref 11.5–15.5)
WBC: 7.5 K/uL (ref 4.0–10.5)

## 2024-02-18 LAB — IBC + FERRITIN
Ferritin: 21.7 ng/mL (ref 10.0–291.0)
Iron: 105 ug/dL (ref 42–145)
Saturation Ratios: 25.9 % (ref 20.0–50.0)
TIBC: 406 ug/dL (ref 250.0–450.0)
Transferrin: 290 mg/dL (ref 212.0–360.0)

## 2024-02-18 NOTE — Telephone Encounter (Signed)
 Inbound call from patient stating that she is experiencing black tarry stools and is unsure weather she is able to continue to have her EGD appointment. Patient is requesting a call back from the nurse. Please advise.

## 2024-02-18 NOTE — Telephone Encounter (Signed)
 Patient states that she developed black stool x 1 this afternoon and was concerned that she may not be able to have endoscopy as scheduled on Wednesday. Patient states that she had a normal soft brown stool earlier today and has since had 2 bowel movements with the last being black. She has recently started oral iron supplementation and has taken only 3 iron pills. She is advised this can cause black stool. However, since she also has a history of GI bleed with some more recent episodes of hematemesis, she is cautioned that if she has additional black, tarry stools along with any abdominal pain or chest pain, any nausea/vomiting/hematemesis, increased SOB, profound fatigue prior to Wednesday, she should be seen in the emergency room for more urgent evaluation/treatment.  Patient did have a repeat CBC today showing stable hemoglobin over the weekend. Iron studies normal. Advised her this is reassuring.   Patient verbalizes understanding and agrees to plan.

## 2024-02-18 NOTE — Telephone Encounter (Signed)
 Have her come 2 hours early for EGD and have stat CBC before egd Thanks JMP

## 2024-02-19 NOTE — Telephone Encounter (Signed)
 I have spoken to patient who states she has had no additional bowel movements since we spoke yesterday, so no additional tarry stool. She is advised to hold iron supplement today and tomorrow prior to procedure. She is reminded to hold Eliquis 24 hours prior to test as well. Patient will come at 930 am tomorrow to our lab for STAT CBC. Orders have been placed in [employer] for this.

## 2024-02-20 ENCOUNTER — Other Ambulatory Visit (INDEPENDENT_AMBULATORY_CARE_PROVIDER_SITE_OTHER)

## 2024-02-20 ENCOUNTER — Ambulatory Visit: Payer: Self-pay | Admitting: Internal Medicine

## 2024-02-20 ENCOUNTER — Encounter: Payer: Self-pay | Admitting: Internal Medicine

## 2024-02-20 ENCOUNTER — Ambulatory Visit: Admitting: Internal Medicine

## 2024-02-20 VITALS — BP 161/85 | HR 73 | Temp 97.7°F | Resp 14 | Ht 70.0 in | Wt 192.4 lb

## 2024-02-20 DIAGNOSIS — I4891 Unspecified atrial fibrillation: Secondary | ICD-10-CM | POA: Diagnosis not present

## 2024-02-20 DIAGNOSIS — E039 Hypothyroidism, unspecified: Secondary | ICD-10-CM | POA: Diagnosis not present

## 2024-02-20 DIAGNOSIS — K259 Gastric ulcer, unspecified as acute or chronic, without hemorrhage or perforation: Secondary | ICD-10-CM | POA: Diagnosis not present

## 2024-02-20 DIAGNOSIS — R195 Other fecal abnormalities: Secondary | ICD-10-CM | POA: Diagnosis not present

## 2024-02-20 DIAGNOSIS — R112 Nausea with vomiting, unspecified: Secondary | ICD-10-CM | POA: Diagnosis not present

## 2024-02-20 DIAGNOSIS — K219 Gastro-esophageal reflux disease without esophagitis: Secondary | ICD-10-CM

## 2024-02-20 DIAGNOSIS — K31819 Angiodysplasia of stomach and duodenum without bleeding: Secondary | ICD-10-CM | POA: Diagnosis not present

## 2024-02-20 DIAGNOSIS — Q399 Congenital malformation of esophagus, unspecified: Secondary | ICD-10-CM | POA: Diagnosis not present

## 2024-02-20 DIAGNOSIS — K921 Melena: Secondary | ICD-10-CM | POA: Diagnosis not present

## 2024-02-20 DIAGNOSIS — K449 Diaphragmatic hernia without obstruction or gangrene: Secondary | ICD-10-CM | POA: Diagnosis not present

## 2024-02-20 DIAGNOSIS — I509 Heart failure, unspecified: Secondary | ICD-10-CM | POA: Diagnosis not present

## 2024-02-20 DIAGNOSIS — F419 Anxiety disorder, unspecified: Secondary | ICD-10-CM | POA: Diagnosis not present

## 2024-02-20 LAB — CBC WITH DIFFERENTIAL/PLATELET
Basophils Absolute: 0 K/uL (ref 0.0–0.1)
Basophils Relative: 0.5 % (ref 0.0–3.0)
Eosinophils Absolute: 0.1 K/uL (ref 0.0–0.7)
Eosinophils Relative: 1 % (ref 0.0–5.0)
HCT: 34.2 % — ABNORMAL LOW (ref 36.0–46.0)
Hemoglobin: 11 g/dL — ABNORMAL LOW (ref 12.0–15.0)
Lymphocytes Relative: 19.7 % (ref 12.0–46.0)
Lymphs Abs: 1.1 K/uL (ref 0.7–4.0)
MCHC: 32.2 g/dL (ref 30.0–36.0)
MCV: 77.7 fl — ABNORMAL LOW (ref 78.0–100.0)
Monocytes Absolute: 0.4 K/uL (ref 0.1–1.0)
Monocytes Relative: 6.2 % (ref 3.0–12.0)
Neutro Abs: 4.1 K/uL (ref 1.4–7.7)
Neutrophils Relative %: 72.6 % (ref 43.0–77.0)
Platelets: 134 K/uL — ABNORMAL LOW (ref 150.0–400.0)
RBC: 4.39 Mil/uL (ref 3.87–5.11)
RDW: 17.7 % — ABNORMAL HIGH (ref 11.5–15.5)
WBC: 5.7 K/uL (ref 4.0–10.5)

## 2024-02-20 MED ORDER — SODIUM CHLORIDE 0.9 % IV SOLN: 500.0000 mL | INTRAVENOUS | Status: DC

## 2024-02-20 NOTE — Progress Notes (Signed)
 GASTROENTEROLOGY PROCEDURE H&P NOTE   Primary Care Physician: Onita Rush, MD    Reason for Procedure:  Recent melena, nausea and vomiting, known hiatal hernia, mild drop in baseline hemoglobin  Plan:    EGD  Patient is appropriate for endoscopic procedure(s) in the ambulatory ([facility]) setting.  The nature of the procedure, as well as the risks, benefits, and alternatives were carefully and thoroughly reviewed with the patient. Ample time for discussion and questions allowed.  All questions were answered. The patient understood, was satisfied, and agreed with the plan to proceed.    HPI: Brandy Golden is a 78 y.o. female who presents for EGD.  Medical history as below.  No recent chest pain or shortness of breath.  No abdominal pain today.  Hemoglobin was checked again this morning preop and stable at 11.0.  It was 11.2 two days ago and 10.7 six days ago.  Baseline is felt to be low 12's.  Past Medical History:  Diagnosis Date   Allergy    Anemia, iron deficiency    Anxiety    Atrial fibrillation (HCC) 01/2021   Benign essential tremor    Blood transfusion without reported diagnosis    Cancer (HCC)    bacal cell nd squmous cell skin cancers   Cataract    CHF (congestive heart failure) (HCC)    Chronic kidney disease    Colon polyps    Depression    Diverticulosis    Dysphonia    Dysrhythmia    Fibrocystic Breast disease    pt denies   Gastric ulcer 2006   Gastrointestinal hemorrhage, hx of    GERD (gastroesophageal reflux disease)    History of COVID-19    History of hiatal hernia    Hx of basal cell carcinoma    and squamous cell skin cancer   Hyperlipidemia    Hypertension    Hypothyroidism    IBS (irritable bowel syndrome)    Internal hemorrhoids without mention of complication    LBBB (left bundle branch block)    Osteoarthritis    Overactive bladder    Stroke (HCC) 03/2018   Unspecified hypothyroidism    Vitamin D  deficiency     Past Surgical History:   Procedure Laterality Date   ABDOMINAL HYSTERECTOMY  1984   CATARACT EXTRACTION W/PHACO Left 12/04/2017   Procedure: CATARACT EXTRACTION PHACO AND INTRAOCULAR LENS PLACEMENT (IOC);  Surgeon: Jaye Fallow, MD;  Location: [facility];  Service: Ophthalmology;  Laterality: Left;  US  01: 13.5 AP% 18.8 CDE 13.85 Fluid Pack lot # [id number]   CATARACT EXTRACTION W/PHACO Right 12/25/2017   Procedure: CATARACT EXTRACTION PHACO AND INTRAOCULAR LENS PLACEMENT (IOC);  Surgeon: Jaye Fallow, MD;  Location: [facility];  Service: Ophthalmology;  Laterality: Right;  US   00:35 AP% 17.5 CDE 6.23 Fluid pack lot # [id number]   COLONOSCOPY     FOOT SURGERY     HERNIA REPAIR  2011   Laparoscopic repair of large hiatal hernia/nissen  10/2009   Dr. Donnice Lunger   laryngeal nerve medialization and reconstruction  07/13/2016   Left foot surgery     NASAL SINUS SURGERY     NISSEN FUNDOPLICATION     PACEMAKER LEADLESS INSERTION N/A 03/07/2019   Procedure: PACEMAKER LEADLESS INSERTION;  Surgeon: Ammon Blunt, MD;  Location: [facility];  Service: Cardiovascular;  Laterality: N/A;   rhinoplasty with submocosal resection     Submucosal resection     TEMPORARY PACEMAKER N/A 03/07/2019   Procedure: TEMPORARY  PACEMAKER;  Surgeon: Ammon Blunt, MD;  Location: [facility];  Service: Cardiovascular;  Laterality: N/A;   THYROIDECTOMY  2004   tracheal /paraesophageal mass surgery  08/2013   cartilage surgery and gortex build up of cords   UPPER GASTROINTESTINAL ENDOSCOPY      Prior to Admission medications   Medication Sig Start Date End Date Taking? Authorizing Provider  ALPRAZolam  (XANAX ) 0.5 MG tablet Take 0.5 mg by mouth 2 (two) times daily as needed for anxiety or sleep. 05/12/13  Yes [provider]  cephALEXin (KEFLEX) 250 MG capsule Take 250 mg by mouth daily.   Yes [provider]  Cholecalciferol  (VITAMIN D3) 1000 units CAPS Take 1,000 Units by mouth  daily.   Yes [provider]  colestipol  (COLESTID ) 1 g tablet TAKE 3 TABLETS BY MOUTH DAILY 04/05/23  Yes Torre Pikus, Gordy HERO, MD  ferrous sulfate 325 (65 FE) MG tablet Take 1 tablet (325 mg total) by mouth daily with breakfast. 02/14/24  Yes Kotaro Buer, Gordy HERO, MD  Hyoscyamine  Sulfate SL 0.125 MG SUBL Place 1 tablet under the tongue every 6 (six) hours as needed.   Yes [provider]  minoxidil (LONITEN) 2.5 MG tablet Take 2.5 mg by mouth daily. Takes 1/2 tablet daily   Yes [provider]  Multiple Vitamin (MULTI-VITAMIN DAILY PO) Take 1 tablet by mouth daily. 10/16/08  Yes [provider]  omeprazole  (PRILOSEC) 40 MG capsule TAKE 1 CAPSULE BY MOUTH TWICE DAILY BEFORE A MEAL 09/19/23  Yes Ladon Heney, Gordy HERO, MD  sertraline  (ZOLOFT ) 100 MG tablet Take 100 mg by mouth daily. 06/05/13  Yes [provider]  SYNTHROID  125 MCG tablet Take 125 mcg by mouth daily before breakfast.  04/17/13  Yes [provider]  vitamin C  (ASCORBIC ACID ) 500 MG tablet Take 1,000 mg by mouth daily.    Yes [provider]  Biotin  5 MG CAPS Take 5 mg by mouth 2 (two) times daily.    [provider]  Ca Carbonate-Mag Hydroxide (ROLAIDS PO) Take 2 each by mouth daily as needed (for acid reflux).    [provider]  Calcium  Carb-Cholecalciferol  (CALCIUM  + VITAMIN D3 PO) Take 2 tablets by mouth 2 (two) times daily.    [provider]  ELIQUIS 5 MG TABS tablet Take 5 mg by mouth 2 (two) times daily. 01/17/21   [provider]  estradiol (ESTRACE) 0.01 % CREA vaginal cream Place 1 Applicatorful vaginally 3 (three) times a week. 02/11/24   [provider]  fluticasone (FLONASE) 50 MCG/ACT nasal spray TAKE 2 SPRAYS INTO EACH NOSTRIL AT BEDTIME 01/31/17   [provider]  hydrocortisone  (ANUSOL -HC) 2.5 % rectal cream Place 1 Application rectally 2 (two) times daily. Patient not taking: Reported on 05/07/2023 05/19/22   Craig Alan SAUNDERS, PA-C   sodium fluoride (FLUORISHIELD) 1.1 % GEL dental gel Place onto teeth. 08/31/14   [provider]  spironolactone (ALDACTONE) 25 MG tablet Take 25 mg by mouth daily. Patient not taking: Reported on 02/14/2024    [provider]  sucralfate  (CARAFATE ) 1 g tablet Take 1 tablet (1 g total) by mouth 4 (four) times daily -  with meals and at bedtime. 05/19/22   Craig Alan SAUNDERS, PA-C  torsemide (DEMADEX) 20 MG tablet Take 20 mg by mouth daily. Patient not taking: Reported on 02/14/2024    [provider]  zinc  gluconate 50 MG tablet Take 50 mg by mouth daily. Patient not taking: Reported on 02/14/2024  [provider]    Current Outpatient Medications  Medication Sig Dispense Refill   ALPRAZolam  (XANAX ) 0.5 MG tablet Take 0.5 mg by mouth 2 (two) times daily as needed for anxiety or sleep.     cephALEXin (KEFLEX) 250 MG capsule Take 250 mg by mouth daily.     Cholecalciferol  (VITAMIN D3) 1000 units CAPS Take 1,000 Units by mouth daily.     colestipol  (COLESTID ) 1 g tablet TAKE 3 TABLETS BY MOUTH DAILY 270 tablet 1   ferrous sulfate 325 (65 FE) MG tablet Take 1 tablet (325 mg total) by mouth daily with breakfast. 30 tablet 3   Hyoscyamine  Sulfate SL 0.125 MG SUBL Place 1 tablet under the tongue every 6 (six) hours as needed.     minoxidil (LONITEN) 2.5 MG tablet Take 2.5 mg by mouth daily. Takes 1/2 tablet daily     Multiple Vitamin (MULTI-VITAMIN DAILY PO) Take 1 tablet by mouth daily.     omeprazole  (PRILOSEC) 40 MG capsule TAKE 1 CAPSULE BY MOUTH TWICE DAILY BEFORE A MEAL 180 capsule 3   sertraline  (ZOLOFT ) 100 MG tablet Take 100 mg by mouth daily.     SYNTHROID  125 MCG tablet Take 125 mcg by mouth daily before breakfast.      vitamin C  (ASCORBIC ACID ) 500 MG tablet Take 1,000 mg by mouth daily.      Biotin  5 MG CAPS Take 5 mg by mouth 2 (two) times daily.     Ca Carbonate-Mag Hydroxide (ROLAIDS PO) Take 2 each by mouth daily as needed (for acid reflux).      Calcium  Carb-Cholecalciferol  (CALCIUM  + VITAMIN D3 PO) Take 2 tablets by mouth 2 (two) times daily.     ELIQUIS 5 MG TABS tablet Take 5 mg by mouth 2 (two) times daily.     estradiol (ESTRACE) 0.01 % CREA vaginal cream Place 1 Applicatorful vaginally 3 (three) times a week.     fluticasone (FLONASE) 50 MCG/ACT nasal spray TAKE 2 SPRAYS INTO EACH NOSTRIL AT BEDTIME     hydrocortisone  (ANUSOL -HC) 2.5 % rectal cream Place 1 Application rectally 2 (two) times daily. (Patient not taking: Reported on 05/07/2023) 30 g 2   sodium fluoride (FLUORISHIELD) 1.1 % GEL dental gel Place onto teeth.     spironolactone (ALDACTONE) 25 MG tablet Take 25 mg by mouth daily. (Patient not taking: Reported on 02/14/2024)     sucralfate  (CARAFATE ) 1 g tablet Take 1 tablet (1 g total) by mouth 4 (four) times daily -  with meals and at bedtime. 120 tablet 0   torsemide (DEMADEX) 20 MG tablet Take 20 mg by mouth daily. (Patient not taking: Reported on 02/14/2024)     zinc  gluconate 50 MG tablet Take 50 mg by mouth daily. (Patient not taking: Reported on 02/14/2024)     Current Facility-Administered Medications  Medication Dose Route Frequency Provider Last Rate Last Admin   0.9 %  sodium chloride  infusion  500 mL Intravenous Continuous Shawnn Bouillon, Gordy HERO, MD        Allergies as of 02/20/2024 - Review Complete 02/20/2024  Allergen Reaction Noted   Macrolides and ketolides Rash 03/14/2020   Niacin Rash and Dermatitis 03/13/2019   Other Rash and Dermatitis 10/16/2008    Family History  Problem Relation Age of Onset   Diabetes Father        2 aunts, 1 cousin   CVA Father    Heart disease Mother    Breast cancer Sister    Colitis Cousin  Breast cancer Cousin    Breast cancer Maternal Aunt    Colon cancer Neg Hx    Esophageal cancer Neg Hx    Rectal cancer Neg Hx    Stomach cancer Neg Hx     Social History   Socioeconomic History   Marital status: Married    Spouse name: Not on file   Number of children: 2    Years of education: Not on file   Highest education level: Not on file  Occupational History   Occupation: Retired runner, broadcasting/film/video  Tobacco Use   Smoking status: Never   Smokeless tobacco: Never  Vaping Use   Vaping status: Never Used  Substance and Sexual Activity   Alcohol  use: Yes    Comment: very rare   Drug use: No   Sexual activity: Not on file  Other Topics Concern   Not on file  Social History Narrative   Not on file   Social Drivers of Health   Financial Resource Strain: Low Risk  (05/14/2023)   Received from [facility]   Overall Financial Resource Strain (CARDIA)    Difficulty of Paying Living Expenses: Not hard at all  Food Insecurity: No Food Insecurity (05/14/2023)   Received from [facility]   Hunger Vital Sign    Within the past 12 months, you worried that your food would run out before you got the money to buy more.: Never true    Within the past 12 months, the food you bought just didn't last and you didn't have money to get more.: Never true  Transportation Needs: No Transportation Needs (05/14/2023)   Received from [facility] - Transportation    In the past 12 months, has lack of transportation kept you from medical appointments or from getting medications?: No    Lack of Transportation (Non-Medical): No  Physical Activity: Not on file  Stress: Not on file  Social Connections: Not on file  Intimate Partner Violence: Not on file    Physical Exam: Vital signs in last 24 hours: @BP  (!) 160/85   Pulse 82   Temp 97.7 F (36.5 C) (Skin)   Ht 5' 10 (1.778 m)   Wt 192 lb 6.4 oz (87.3 kg)   SpO2 97%   BMI 27.61 kg/m  GEN: NAD EYE: Sclerae anicteric ENT: MMM CV: Non-tachycardic Pulm: CTA b/l GI: Soft, NT/ND NEURO:  Alert & Oriented x 3   Gordy Starch, MD [facility]  02/20/2024 11:44 AM

## 2024-02-20 NOTE — Progress Notes (Signed)
 Pt states she cannot remember if she took Eliquis on 11/18, but has not taken it today. If she did take Eliquis yesterday she states it would have been well over 24 hours ago.

## 2024-02-20 NOTE — Op Note (Signed)
 [facility] Patient Name: Brandy Golden Procedure Date: 02/20/2024 11:55 AM MRN: [id number] Endoscopist: Gordy CHRISTELLA Starch , MD, [id number] Age: 78 Referring MD:  Date of Birth: [birth year] Gender: Female Account #: [id number] Procedure:                Upper GI endoscopy Indications:              Coffee-ground emesis, Melena, Recent                            gastrointestinal bleeding Medicines:                Monitored Anesthesia Care Procedure:                Pre-Anesthesia Assessment:                           - Prior to the procedure, a History and Physical                            was performed, and patient medications and                            allergies were reviewed. The patient's tolerance of                            previous anesthesia was also reviewed. The risks                            and benefits of the procedure and the sedation                            options and risks were discussed with the patient.                            All questions were answered, and informed consent                            was obtained. Prior Anticoagulants: The patient has                            taken Eliquis (apixaban), last dose was 1 day prior                            to procedure. ASA Grade Assessment: III - A patient                            with severe systemic disease. After reviewing the                            risks and benefits, the patient was deemed in                            satisfactory condition to undergo the procedure.  After obtaining informed consent, the endoscope was                            passed under direct vision. Throughout the                            procedure, the patient's blood pressure, pulse, and                            oxygen saturations were monitored continuously. The                            Olympus scope [id number] was introduced through the                            mouth, and advanced  to the second part of duodenum.                            The upper GI endoscopy was accomplished without                            difficulty. The patient tolerated the procedure                            well. Scope In: Scope Out: Findings:                 The lower third of the esophagus was mildly                            tortuous.                           A medium-sized hiatal hernia with a single Cameron                            ulcer was found. The proximal extent of the gastric                            folds (end of tubular esophagus) was 33 cm from the                            incisors. The hiatal narrowing was 38 cm from the                            incisors.                           Mild gastric antral vascular ectasia without                            bleeding was present in the prepyloric region of                            the stomach.  The examined duodenum was normal. Complications:            No immediate complications. Estimated Blood Loss:     Estimated blood loss: none. Impression:               - Tortuous esophagus.                           - Medium-sized hiatal hernia with a single                            Cameron's lesion (nonbleeding today). Mostly likely                            source of recent hematemesis and melena.                           - Mild gastric antral vascular ectasia without                            bleeding.                           - Normal examined duodenum.                           - No specimens collected. Recommendation:           - Patient has a contact number available for                            emergencies. The signs and symptoms of potential                            delayed complications were discussed with the                            patient. Return to normal activities tomorrow.                            Written discharge instructions were provided to the                             patient.                           - Resume previous diet.                           - Continue present medications including omeprazole                             40 mg twice daily.                           - Can resume Eliquis at previous dose per  prescribing provider.                           - Monitor for further black emesis or stools. If                            this is present then we will need consider CT                            surgery consult for hiatal hernia repair. Continue                            oral iron.                           - Repeat CBC, ferritin + IBC in 2 weeks. Gordy CHRISTELLA Starch, MD 02/20/2024 12:14:31 PM This report has been signed electronically.

## 2024-02-20 NOTE — Patient Instructions (Addendum)
 RESUME Eliquis at previous dose per prescribing provider.  REPEAT CBC, ferritin + IBC in 2 weeks.  YOU HAD AN ENDOSCOPIC PROCEDURE TODAY AT THE [facility]:   Refer to the procedure report that was given to you for any specific questions about what was found during the examination.  If the procedure report does not answer your questions, please call your gastroenterologist to clarify.  If you requested that your care partner not be given the details of your procedure findings, then the procedure report has been included in a sealed envelope for you to review at your convenience later.  YOU SHOULD EXPECT: Some feelings of bloating in the abdomen. Passage of more gas than usual.  Walking can help get rid of the air that was put into your GI tract during the procedure and reduce the bloating. If you had a lower endoscopy (such as a colonoscopy or flexible sigmoidoscopy) you may notice spotting of blood in your stool or on the toilet paper. If you underwent a bowel prep for your procedure, you may not have a normal bowel movement for a few days.  Please Note:  You might notice some irritation and congestion in your nose or some drainage.  This is from the oxygen used during your procedure.  There is no need for concern and it should clear up in a day or so.  SYMPTOMS TO REPORT IMMEDIATELY:  Following upper endoscopy (EGD)  Vomiting of blood or coffee ground material  New chest pain or pain under the shoulder blades  Painful or persistently difficult swallowing  New shortness of breath  Fever of 100F or higher  Black, tarry-looking stools  For urgent or emergent issues, a gastroenterologist can be reached at any hour by calling [phone]. Do not use MyChart messaging for urgent concerns.    DIET:  We do recommend a small meal at first, but then you may proceed to your regular diet.  Drink plenty of fluids but you should avoid alcoholic beverages for 24 hours.  ACTIVITY:  You  should plan to take it easy for the rest of today and you should NOT DRIVE or use heavy machinery until tomorrow (because of the sedation medicines used during the test).    FOLLOW UP: Our staff will call the number listed on your records the next business day following your procedure.  We will call around 7:15- 8:00 am to check on you and address any questions or concerns that you may have regarding the information given to you following your procedure. If we do not reach you, we will leave a message.     If any biopsies were taken you will be contacted by phone or by letter within the next 1-3 weeks.  Please call us  at [phone] if you have not heard about the biopsies in 3 weeks.    SIGNATURES/CONFIDENTIALITY: You and/or your care partner have signed paperwork which will be entered into your electronic medical record.  These signatures attest to the fact that that the information above on your After Visit Summary has been reviewed and is understood.  Full responsibility of the confidentiality of this discharge information lies with you and/or your care-partner.

## 2024-02-21 ENCOUNTER — Telehealth: Payer: Self-pay

## 2024-02-21 ENCOUNTER — Other Ambulatory Visit: Payer: Self-pay

## 2024-02-21 DIAGNOSIS — R195 Other fecal abnormalities: Secondary | ICD-10-CM

## 2024-02-21 DIAGNOSIS — K921 Melena: Secondary | ICD-10-CM

## 2024-02-21 NOTE — Telephone Encounter (Signed)
  Follow up Call-     02/20/2024   11:00 AM  Call back number  Post procedure Call Back phone  # [phone]  Permission to leave phone message Yes     Patient questions:  Do you have a fever, pain , or abdominal swelling? No. Pain Score  0 *  Have you tolerated food without any problems? Yes.    Have you been able to return to your normal activities? Yes.    Do you have any questions about your discharge instructions: Diet   No. Medications  No. Follow up visit  No.  Do you have questions or concerns about your Care? No.  Actions: * If pain score is 4 or above: No action needed, pain <4.

## 2024-02-22 DIAGNOSIS — G8929 Other chronic pain: Secondary | ICD-10-CM | POA: Diagnosis not present

## 2024-02-22 DIAGNOSIS — M25562 Pain in left knee: Secondary | ICD-10-CM | POA: Diagnosis not present

## 2024-02-26 DIAGNOSIS — G8929 Other chronic pain: Secondary | ICD-10-CM | POA: Diagnosis not present

## 2024-02-26 DIAGNOSIS — M25562 Pain in left knee: Secondary | ICD-10-CM | POA: Diagnosis not present

## 2024-02-27 ENCOUNTER — Ambulatory Visit: Admitting: Physician Assistant

## 2024-03-05 DIAGNOSIS — M25562 Pain in left knee: Secondary | ICD-10-CM | POA: Diagnosis not present

## 2024-03-05 DIAGNOSIS — G8929 Other chronic pain: Secondary | ICD-10-CM | POA: Diagnosis not present

## 2024-03-06 ENCOUNTER — Other Ambulatory Visit

## 2024-03-06 DIAGNOSIS — R195 Other fecal abnormalities: Secondary | ICD-10-CM

## 2024-03-06 DIAGNOSIS — K921 Melena: Secondary | ICD-10-CM

## 2024-03-06 LAB — IBC + FERRITIN
Ferritin: 40.9 ng/mL (ref 10.0–291.0)
Iron: 87 ug/dL (ref 42–145)
Saturation Ratios: 20.8 % (ref 20.0–50.0)
TIBC: 418.6 ug/dL (ref 250.0–450.0)
Transferrin: 299 mg/dL (ref 212.0–360.0)

## 2024-03-06 LAB — CBC WITH DIFFERENTIAL/PLATELET
Basophils Absolute: 0 K/uL (ref 0.0–0.1)
Basophils Relative: 0.1 % (ref 0.0–3.0)
Eosinophils Absolute: 0 K/uL (ref 0.0–0.7)
Eosinophils Relative: 0.4 % (ref 0.0–5.0)
HCT: 36.2 % (ref 36.0–46.0)
Hemoglobin: 11.8 g/dL — ABNORMAL LOW (ref 12.0–15.0)
Lymphocytes Relative: 11.6 % — ABNORMAL LOW (ref 12.0–46.0)
Lymphs Abs: 1.2 K/uL (ref 0.7–4.0)
MCHC: 32.7 g/dL (ref 30.0–36.0)
MCV: 79.8 fl (ref 78.0–100.0)
Monocytes Absolute: 0.6 K/uL (ref 0.1–1.0)
Monocytes Relative: 5.4 % (ref 3.0–12.0)
Neutro Abs: 8.7 K/uL — ABNORMAL HIGH (ref 1.4–7.7)
Neutrophils Relative %: 82.5 % — ABNORMAL HIGH (ref 43.0–77.0)
Platelets: 169 K/uL (ref 150.0–400.0)
RBC: 4.53 Mil/uL (ref 3.87–5.11)
RDW: 20.6 % — ABNORMAL HIGH (ref 11.5–15.5)
WBC: 10.6 K/uL — ABNORMAL HIGH (ref 4.0–10.5)

## 2024-03-07 ENCOUNTER — Ambulatory Visit: Payer: Self-pay | Admitting: Internal Medicine

## 2024-03-07 DIAGNOSIS — K921 Melena: Secondary | ICD-10-CM

## 2024-03-07 DIAGNOSIS — R195 Other fecal abnormalities: Secondary | ICD-10-CM

## 2024-03-11 ENCOUNTER — Other Ambulatory Visit: Payer: Self-pay | Admitting: Internal Medicine

## 2024-03-12 ENCOUNTER — Encounter: Payer: Self-pay | Admitting: Physical Medicine and Rehabilitation

## 2024-03-12 ENCOUNTER — Other Ambulatory Visit: Payer: Self-pay | Admitting: Physical Medicine and Rehabilitation

## 2024-03-12 DIAGNOSIS — M419 Scoliosis, unspecified: Secondary | ICD-10-CM | POA: Diagnosis not present

## 2024-03-12 DIAGNOSIS — M5416 Radiculopathy, lumbar region: Secondary | ICD-10-CM

## 2024-03-12 DIAGNOSIS — M51362 Other intervertebral disc degeneration, lumbar region with discogenic back pain and lower extremity pain: Secondary | ICD-10-CM | POA: Diagnosis not present

## 2024-03-12 DIAGNOSIS — M25362 Other instability, left knee: Secondary | ICD-10-CM | POA: Diagnosis not present

## 2024-03-13 ENCOUNTER — Ambulatory Visit
Admission: RE | Admit: 2024-03-13 | Discharge: 2024-03-13 | Disposition: A | Discharge status: Home / Self Care | Source: Ambulatory Visit | Attending: Physical Medicine and Rehabilitation | Admitting: Physical Medicine and Rehabilitation

## 2024-03-13 DIAGNOSIS — M5416 Radiculopathy, lumbar region: Secondary | ICD-10-CM

## 2024-04-02 ENCOUNTER — Encounter: Payer: Self-pay | Admitting: *Deleted

## 2024-04-07 ENCOUNTER — Other Ambulatory Visit (INDEPENDENT_AMBULATORY_CARE_PROVIDER_SITE_OTHER)

## 2024-04-07 ENCOUNTER — Telehealth: Payer: Self-pay | Admitting: Internal Medicine

## 2024-04-07 DIAGNOSIS — K921 Melena: Secondary | ICD-10-CM

## 2024-04-07 DIAGNOSIS — R195 Other fecal abnormalities: Secondary | ICD-10-CM

## 2024-04-07 LAB — CBC WITH DIFFERENTIAL/PLATELET
Basophils Absolute: 0 K/uL (ref 0.0–0.1)
Basophils Relative: 0.5 % (ref 0.0–3.0)
Eosinophils Absolute: 0.1 K/uL (ref 0.0–0.7)
Eosinophils Relative: 1.7 % (ref 0.0–5.0)
HCT: 36.1 % (ref 36.0–46.0)
Hemoglobin: 11.9 g/dL — ABNORMAL LOW (ref 12.0–15.0)
Lymphocytes Relative: 24.4 % (ref 12.0–46.0)
Lymphs Abs: 1.1 K/uL (ref 0.7–4.0)
MCHC: 32.9 g/dL (ref 30.0–36.0)
MCV: 83.4 fl (ref 78.0–100.0)
Monocytes Absolute: 0.4 K/uL (ref 0.1–1.0)
Monocytes Relative: 8.5 % (ref 3.0–12.0)
Neutro Abs: 3 K/uL (ref 1.4–7.7)
Neutrophils Relative %: 64.9 % (ref 43.0–77.0)
Platelets: 157 K/uL (ref 150.0–400.0)
RBC: 4.34 Mil/uL (ref 3.87–5.11)
RDW: 20.5 % — ABNORMAL HIGH (ref 11.5–15.5)
WBC: 4.6 K/uL (ref 4.0–10.5)

## 2024-04-07 NOTE — Telephone Encounter (Signed)
 See result note on CBC from today JMP

## 2024-04-07 NOTE — Telephone Encounter (Signed)
 Hello Dr Albertus,   Patient came in today stated she sent you a message through Mychart last night and did her lab today and to inform you that her stool is black since 1/1. Please advise.

## 2024-04-08 LAB — FERRITIN: Ferritin: 35 ng/mL (ref 16–288)

## 2024-04-14 ENCOUNTER — Other Ambulatory Visit

## 2024-04-14 ENCOUNTER — Ambulatory Visit: Payer: Self-pay | Admitting: Internal Medicine

## 2024-04-14 DIAGNOSIS — K921 Melena: Secondary | ICD-10-CM

## 2024-04-14 DIAGNOSIS — R195 Other fecal abnormalities: Secondary | ICD-10-CM

## 2024-04-14 LAB — CBC WITH DIFFERENTIAL/PLATELET
Basophils Absolute: 0 K/uL (ref 0.0–0.1)
Basophils Relative: 0.6 % (ref 0.0–3.0)
Eosinophils Absolute: 0.2 K/uL (ref 0.0–0.7)
Eosinophils Relative: 3.1 % (ref 0.0–5.0)
HCT: 39.7 % (ref 36.0–46.0)
Hemoglobin: 13 g/dL (ref 12.0–15.0)
Lymphocytes Relative: 26.9 % (ref 12.0–46.0)
Lymphs Abs: 1.5 K/uL (ref 0.7–4.0)
MCHC: 32.7 g/dL (ref 30.0–36.0)
MCV: 83.6 fl (ref 78.0–100.0)
Monocytes Absolute: 0.5 K/uL (ref 0.1–1.0)
Monocytes Relative: 9.7 % (ref 3.0–12.0)
Neutro Abs: 3.3 K/uL (ref 1.4–7.7)
Neutrophils Relative %: 59.7 % (ref 43.0–77.0)
Platelets: 169 K/uL (ref 150.0–400.0)
RBC: 4.75 Mil/uL (ref 3.87–5.11)
RDW: 19.5 % — ABNORMAL HIGH (ref 11.5–15.5)
WBC: 5.6 K/uL (ref 4.0–10.5)
